# Patient Record
Sex: Female | Born: 1950 | Race: Black or African American | Hispanic: No | Marital: Single
Health system: Southern US, Community
[De-identification: ages and names within clinical notes are randomized; demographics above are authoritative.]

## PROBLEM LIST (undated history)

## (undated) DIAGNOSIS — R569 Unspecified convulsions: Secondary | ICD-10-CM

## (undated) DIAGNOSIS — I639 Cerebral infarction, unspecified: Secondary | ICD-10-CM

## (undated) DIAGNOSIS — I10 Essential (primary) hypertension: Secondary | ICD-10-CM

---

## 2014-02-21 DIAGNOSIS — J441 Chronic obstructive pulmonary disease with (acute) exacerbation: Secondary | ICD-10-CM | POA: Diagnosis not present

## 2014-02-21 DIAGNOSIS — Z9181 History of falling: Secondary | ICD-10-CM | POA: Diagnosis not present

## 2014-02-21 DIAGNOSIS — Z1331 Encounter for screening for depression: Secondary | ICD-10-CM | POA: Diagnosis not present

## 2014-02-21 DIAGNOSIS — I10 Essential (primary) hypertension: Secondary | ICD-10-CM | POA: Diagnosis not present

## 2014-02-21 DIAGNOSIS — M549 Dorsalgia, unspecified: Secondary | ICD-10-CM | POA: Diagnosis not present

## 2014-02-21 DIAGNOSIS — Z6841 Body Mass Index (BMI) 40.0 and over, adult: Secondary | ICD-10-CM | POA: Diagnosis not present

## 2014-03-23 DIAGNOSIS — I10 Essential (primary) hypertension: Secondary | ICD-10-CM | POA: Diagnosis not present

## 2014-03-23 DIAGNOSIS — R1032 Left lower quadrant pain: Secondary | ICD-10-CM | POA: Diagnosis not present

## 2014-03-23 DIAGNOSIS — Z23 Encounter for immunization: Secondary | ICD-10-CM | POA: Diagnosis not present

## 2014-03-23 DIAGNOSIS — Z6841 Body Mass Index (BMI) 40.0 and over, adult: Secondary | ICD-10-CM | POA: Diagnosis not present

## 2014-03-23 DIAGNOSIS — M549 Dorsalgia, unspecified: Secondary | ICD-10-CM | POA: Diagnosis not present

## 2014-04-05 DIAGNOSIS — Z6841 Body Mass Index (BMI) 40.0 and over, adult: Secondary | ICD-10-CM | POA: Diagnosis not present

## 2014-04-05 DIAGNOSIS — R1032 Left lower quadrant pain: Secondary | ICD-10-CM | POA: Diagnosis not present

## 2014-04-05 DIAGNOSIS — M549 Dorsalgia, unspecified: Secondary | ICD-10-CM | POA: Diagnosis not present

## 2014-04-05 DIAGNOSIS — J449 Chronic obstructive pulmonary disease, unspecified: Secondary | ICD-10-CM | POA: Diagnosis not present

## 2014-04-05 DIAGNOSIS — K5900 Constipation, unspecified: Secondary | ICD-10-CM | POA: Diagnosis not present

## 2014-04-05 DIAGNOSIS — N289 Disorder of kidney and ureter, unspecified: Secondary | ICD-10-CM | POA: Diagnosis not present

## 2014-04-05 DIAGNOSIS — E785 Hyperlipidemia, unspecified: Secondary | ICD-10-CM | POA: Diagnosis not present

## 2014-04-05 DIAGNOSIS — E1129 Type 2 diabetes mellitus with other diabetic kidney complication: Secondary | ICD-10-CM | POA: Diagnosis not present

## 2014-05-07 DIAGNOSIS — R1032 Left lower quadrant pain: Secondary | ICD-10-CM | POA: Diagnosis not present

## 2014-05-07 DIAGNOSIS — M549 Dorsalgia, unspecified: Secondary | ICD-10-CM | POA: Diagnosis not present

## 2014-05-07 DIAGNOSIS — Z6841 Body Mass Index (BMI) 40.0 and over, adult: Secondary | ICD-10-CM | POA: Diagnosis not present

## 2014-05-07 DIAGNOSIS — N289 Disorder of kidney and ureter, unspecified: Secondary | ICD-10-CM | POA: Diagnosis not present

## 2014-05-07 DIAGNOSIS — K5900 Constipation, unspecified: Secondary | ICD-10-CM | POA: Diagnosis not present

## 2014-05-16 DIAGNOSIS — R1032 Left lower quadrant pain: Secondary | ICD-10-CM | POA: Diagnosis not present

## 2014-06-06 DIAGNOSIS — Z6841 Body Mass Index (BMI) 40.0 and over, adult: Secondary | ICD-10-CM | POA: Diagnosis not present

## 2014-06-06 DIAGNOSIS — K5900 Constipation, unspecified: Secondary | ICD-10-CM | POA: Diagnosis not present

## 2014-06-06 DIAGNOSIS — M549 Dorsalgia, unspecified: Secondary | ICD-10-CM | POA: Diagnosis not present

## 2014-07-11 DIAGNOSIS — J449 Chronic obstructive pulmonary disease, unspecified: Secondary | ICD-10-CM | POA: Diagnosis not present

## 2014-07-11 DIAGNOSIS — M549 Dorsalgia, unspecified: Secondary | ICD-10-CM | POA: Diagnosis not present

## 2014-07-11 DIAGNOSIS — K5900 Constipation, unspecified: Secondary | ICD-10-CM | POA: Diagnosis not present

## 2014-07-11 DIAGNOSIS — E785 Hyperlipidemia, unspecified: Secondary | ICD-10-CM | POA: Diagnosis not present

## 2014-07-11 DIAGNOSIS — E1121 Type 2 diabetes mellitus with diabetic nephropathy: Secondary | ICD-10-CM | POA: Diagnosis not present

## 2014-07-11 DIAGNOSIS — N289 Disorder of kidney and ureter, unspecified: Secondary | ICD-10-CM | POA: Diagnosis not present

## 2014-07-11 DIAGNOSIS — Z6841 Body Mass Index (BMI) 40.0 and over, adult: Secondary | ICD-10-CM | POA: Diagnosis not present

## 2014-08-10 DIAGNOSIS — K5900 Constipation, unspecified: Secondary | ICD-10-CM | POA: Diagnosis not present

## 2014-08-10 DIAGNOSIS — I10 Essential (primary) hypertension: Secondary | ICD-10-CM | POA: Diagnosis not present

## 2014-08-10 DIAGNOSIS — J449 Chronic obstructive pulmonary disease, unspecified: Secondary | ICD-10-CM | POA: Diagnosis not present

## 2014-08-10 DIAGNOSIS — Z6841 Body Mass Index (BMI) 40.0 and over, adult: Secondary | ICD-10-CM | POA: Diagnosis not present

## 2014-08-10 DIAGNOSIS — M549 Dorsalgia, unspecified: Secondary | ICD-10-CM | POA: Diagnosis not present

## 2014-09-12 DIAGNOSIS — G2581 Restless legs syndrome: Secondary | ICD-10-CM | POA: Diagnosis not present

## 2014-09-12 DIAGNOSIS — E1121 Type 2 diabetes mellitus with diabetic nephropathy: Secondary | ICD-10-CM | POA: Diagnosis not present

## 2014-09-12 DIAGNOSIS — Z6841 Body Mass Index (BMI) 40.0 and over, adult: Secondary | ICD-10-CM | POA: Diagnosis not present

## 2014-09-12 DIAGNOSIS — I10 Essential (primary) hypertension: Secondary | ICD-10-CM | POA: Diagnosis not present

## 2014-09-12 DIAGNOSIS — M549 Dorsalgia, unspecified: Secondary | ICD-10-CM | POA: Diagnosis not present

## 2014-10-12 DIAGNOSIS — E1149 Type 2 diabetes mellitus with other diabetic neurological complication: Secondary | ICD-10-CM | POA: Diagnosis not present

## 2014-10-12 DIAGNOSIS — Z79899 Other long term (current) drug therapy: Secondary | ICD-10-CM | POA: Diagnosis not present

## 2014-10-12 DIAGNOSIS — I10 Essential (primary) hypertension: Secondary | ICD-10-CM | POA: Diagnosis not present

## 2014-10-12 DIAGNOSIS — G2581 Restless legs syndrome: Secondary | ICD-10-CM | POA: Diagnosis not present

## 2014-10-12 DIAGNOSIS — M545 Low back pain: Secondary | ICD-10-CM | POA: Diagnosis not present

## 2014-10-12 DIAGNOSIS — E785 Hyperlipidemia, unspecified: Secondary | ICD-10-CM | POA: Diagnosis not present

## 2014-10-12 DIAGNOSIS — J309 Allergic rhinitis, unspecified: Secondary | ICD-10-CM | POA: Diagnosis not present

## 2014-10-12 DIAGNOSIS — D539 Nutritional anemia, unspecified: Secondary | ICD-10-CM | POA: Diagnosis not present

## 2014-10-12 DIAGNOSIS — Z6841 Body Mass Index (BMI) 40.0 and over, adult: Secondary | ICD-10-CM | POA: Diagnosis not present

## 2014-11-09 DIAGNOSIS — M545 Low back pain: Secondary | ICD-10-CM | POA: Diagnosis not present

## 2014-11-09 DIAGNOSIS — J449 Chronic obstructive pulmonary disease, unspecified: Secondary | ICD-10-CM | POA: Diagnosis not present

## 2014-12-10 DIAGNOSIS — Z6839 Body mass index (BMI) 39.0-39.9, adult: Secondary | ICD-10-CM | POA: Diagnosis not present

## 2014-12-10 DIAGNOSIS — M545 Low back pain: Secondary | ICD-10-CM | POA: Diagnosis not present

## 2014-12-10 DIAGNOSIS — J441 Chronic obstructive pulmonary disease with (acute) exacerbation: Secondary | ICD-10-CM | POA: Diagnosis not present

## 2015-01-08 DIAGNOSIS — J449 Chronic obstructive pulmonary disease, unspecified: Secondary | ICD-10-CM | POA: Diagnosis not present

## 2015-01-08 DIAGNOSIS — Z9181 History of falling: Secondary | ICD-10-CM | POA: Diagnosis not present

## 2015-01-08 DIAGNOSIS — D539 Nutritional anemia, unspecified: Secondary | ICD-10-CM | POA: Diagnosis not present

## 2015-01-08 DIAGNOSIS — E1121 Type 2 diabetes mellitus with diabetic nephropathy: Secondary | ICD-10-CM | POA: Diagnosis not present

## 2015-01-08 DIAGNOSIS — I10 Essential (primary) hypertension: Secondary | ICD-10-CM | POA: Diagnosis not present

## 2015-01-08 DIAGNOSIS — Z6841 Body Mass Index (BMI) 40.0 and over, adult: Secondary | ICD-10-CM | POA: Diagnosis not present

## 2015-02-13 DIAGNOSIS — Z0000 Encounter for general adult medical examination without abnormal findings: Secondary | ICD-10-CM | POA: Diagnosis not present

## 2015-02-13 DIAGNOSIS — Z1231 Encounter for screening mammogram for malignant neoplasm of breast: Secondary | ICD-10-CM | POA: Diagnosis not present

## 2015-02-13 DIAGNOSIS — M8588 Other specified disorders of bone density and structure, other site: Secondary | ICD-10-CM | POA: Diagnosis not present

## 2015-02-13 DIAGNOSIS — Z6841 Body Mass Index (BMI) 40.0 and over, adult: Secondary | ICD-10-CM | POA: Diagnosis not present

## 2015-02-27 DIAGNOSIS — Z1231 Encounter for screening mammogram for malignant neoplasm of breast: Secondary | ICD-10-CM | POA: Diagnosis not present

## 2015-02-27 DIAGNOSIS — M8589 Other specified disorders of bone density and structure, multiple sites: Secondary | ICD-10-CM | POA: Diagnosis not present

## 2015-03-11 DIAGNOSIS — K5909 Other constipation: Secondary | ICD-10-CM | POA: Diagnosis not present

## 2015-03-11 DIAGNOSIS — M545 Low back pain: Secondary | ICD-10-CM | POA: Diagnosis not present

## 2015-03-11 DIAGNOSIS — Z6841 Body Mass Index (BMI) 40.0 and over, adult: Secondary | ICD-10-CM | POA: Diagnosis not present

## 2015-04-10 DIAGNOSIS — Z6841 Body Mass Index (BMI) 40.0 and over, adult: Secondary | ICD-10-CM | POA: Diagnosis not present

## 2015-04-10 DIAGNOSIS — M545 Low back pain: Secondary | ICD-10-CM | POA: Diagnosis not present

## 2015-05-08 DIAGNOSIS — Z6841 Body Mass Index (BMI) 40.0 and over, adult: Secondary | ICD-10-CM | POA: Diagnosis not present

## 2015-05-08 DIAGNOSIS — M545 Low back pain: Secondary | ICD-10-CM | POA: Diagnosis not present

## 2015-06-12 DIAGNOSIS — E785 Hyperlipidemia, unspecified: Secondary | ICD-10-CM | POA: Diagnosis not present

## 2015-06-12 DIAGNOSIS — E1121 Type 2 diabetes mellitus with diabetic nephropathy: Secondary | ICD-10-CM | POA: Diagnosis not present

## 2015-06-12 DIAGNOSIS — M545 Low back pain: Secondary | ICD-10-CM | POA: Diagnosis not present

## 2015-06-12 DIAGNOSIS — K5909 Other constipation: Secondary | ICD-10-CM | POA: Diagnosis not present

## 2015-06-12 DIAGNOSIS — D539 Nutritional anemia, unspecified: Secondary | ICD-10-CM | POA: Diagnosis not present

## 2015-06-12 DIAGNOSIS — I10 Essential (primary) hypertension: Secondary | ICD-10-CM | POA: Diagnosis not present

## 2015-06-12 DIAGNOSIS — Z6841 Body Mass Index (BMI) 40.0 and over, adult: Secondary | ICD-10-CM | POA: Diagnosis not present

## 2015-07-15 DIAGNOSIS — Z6841 Body Mass Index (BMI) 40.0 and over, adult: Secondary | ICD-10-CM | POA: Diagnosis not present

## 2015-07-15 DIAGNOSIS — M545 Low back pain: Secondary | ICD-10-CM | POA: Diagnosis not present

## 2015-07-15 DIAGNOSIS — J441 Chronic obstructive pulmonary disease with (acute) exacerbation: Secondary | ICD-10-CM | POA: Diagnosis not present

## 2015-07-15 DIAGNOSIS — Z79891 Long term (current) use of opiate analgesic: Secondary | ICD-10-CM | POA: Diagnosis not present

## 2015-08-15 DIAGNOSIS — E669 Obesity, unspecified: Secondary | ICD-10-CM | POA: Diagnosis not present

## 2015-08-15 DIAGNOSIS — Z6839 Body mass index (BMI) 39.0-39.9, adult: Secondary | ICD-10-CM | POA: Diagnosis not present

## 2015-08-15 DIAGNOSIS — M545 Low back pain: Secondary | ICD-10-CM | POA: Diagnosis not present

## 2015-08-15 DIAGNOSIS — Z1389 Encounter for screening for other disorder: Secondary | ICD-10-CM | POA: Diagnosis not present

## 2015-09-17 DIAGNOSIS — F17200 Nicotine dependence, unspecified, uncomplicated: Secondary | ICD-10-CM | POA: Diagnosis not present

## 2015-09-17 DIAGNOSIS — J449 Chronic obstructive pulmonary disease, unspecified: Secondary | ICD-10-CM | POA: Diagnosis not present

## 2015-09-17 DIAGNOSIS — M79621 Pain in right upper arm: Secondary | ICD-10-CM | POA: Diagnosis not present

## 2015-09-17 DIAGNOSIS — M545 Low back pain: Secondary | ICD-10-CM | POA: Diagnosis not present

## 2015-09-17 DIAGNOSIS — J441 Chronic obstructive pulmonary disease with (acute) exacerbation: Secondary | ICD-10-CM | POA: Diagnosis not present

## 2015-09-17 DIAGNOSIS — Z6838 Body mass index (BMI) 38.0-38.9, adult: Secondary | ICD-10-CM | POA: Diagnosis not present

## 2015-10-07 DIAGNOSIS — R2231 Localized swelling, mass and lump, right upper limb: Secondary | ICD-10-CM | POA: Diagnosis not present

## 2015-10-07 DIAGNOSIS — M79621 Pain in right upper arm: Secondary | ICD-10-CM | POA: Diagnosis not present

## 2015-10-15 DIAGNOSIS — J449 Chronic obstructive pulmonary disease, unspecified: Secondary | ICD-10-CM | POA: Diagnosis not present

## 2015-10-15 DIAGNOSIS — Z6839 Body mass index (BMI) 39.0-39.9, adult: Secondary | ICD-10-CM | POA: Diagnosis not present

## 2015-10-15 DIAGNOSIS — E669 Obesity, unspecified: Secondary | ICD-10-CM | POA: Diagnosis not present

## 2015-10-15 DIAGNOSIS — M545 Low back pain: Secondary | ICD-10-CM | POA: Diagnosis not present

## 2015-10-15 DIAGNOSIS — M79621 Pain in right upper arm: Secondary | ICD-10-CM | POA: Diagnosis not present

## 2015-10-15 DIAGNOSIS — E1121 Type 2 diabetes mellitus with diabetic nephropathy: Secondary | ICD-10-CM | POA: Diagnosis not present

## 2015-10-15 DIAGNOSIS — D539 Nutritional anemia, unspecified: Secondary | ICD-10-CM | POA: Diagnosis not present

## 2015-10-15 DIAGNOSIS — E785 Hyperlipidemia, unspecified: Secondary | ICD-10-CM | POA: Diagnosis not present

## 2015-11-07 DIAGNOSIS — M79621 Pain in right upper arm: Secondary | ICD-10-CM | POA: Diagnosis not present

## 2015-11-12 DIAGNOSIS — M79621 Pain in right upper arm: Secondary | ICD-10-CM | POA: Diagnosis not present

## 2015-11-15 DIAGNOSIS — M545 Low back pain: Secondary | ICD-10-CM | POA: Diagnosis not present

## 2015-11-15 DIAGNOSIS — R7989 Other specified abnormal findings of blood chemistry: Secondary | ICD-10-CM | POA: Diagnosis not present

## 2015-11-15 DIAGNOSIS — Z6838 Body mass index (BMI) 38.0-38.9, adult: Secondary | ICD-10-CM | POA: Diagnosis not present

## 2015-11-18 DIAGNOSIS — M79621 Pain in right upper arm: Secondary | ICD-10-CM | POA: Diagnosis not present

## 2015-11-26 DIAGNOSIS — M79621 Pain in right upper arm: Secondary | ICD-10-CM | POA: Diagnosis not present

## 2015-11-28 DIAGNOSIS — M79621 Pain in right upper arm: Secondary | ICD-10-CM | POA: Diagnosis not present

## 2015-12-13 DIAGNOSIS — E1149 Type 2 diabetes mellitus with other diabetic neurological complication: Secondary | ICD-10-CM | POA: Diagnosis not present

## 2015-12-13 DIAGNOSIS — Z6837 Body mass index (BMI) 37.0-37.9, adult: Secondary | ICD-10-CM | POA: Diagnosis not present

## 2015-12-13 DIAGNOSIS — M545 Low back pain: Secondary | ICD-10-CM | POA: Diagnosis not present

## 2015-12-21 DIAGNOSIS — R0602 Shortness of breath: Secondary | ICD-10-CM | POA: Diagnosis not present

## 2015-12-21 DIAGNOSIS — R531 Weakness: Secondary | ICD-10-CM | POA: Diagnosis not present

## 2015-12-21 DIAGNOSIS — R55 Syncope and collapse: Secondary | ICD-10-CM | POA: Diagnosis not present

## 2015-12-22 DIAGNOSIS — E119 Type 2 diabetes mellitus without complications: Secondary | ICD-10-CM | POA: Diagnosis not present

## 2015-12-22 DIAGNOSIS — E1140 Type 2 diabetes mellitus with diabetic neuropathy, unspecified: Secondary | ICD-10-CM | POA: Diagnosis not present

## 2015-12-22 DIAGNOSIS — N289 Disorder of kidney and ureter, unspecified: Secondary | ICD-10-CM | POA: Diagnosis not present

## 2015-12-22 DIAGNOSIS — E162 Hypoglycemia, unspecified: Secondary | ICD-10-CM | POA: Diagnosis not present

## 2016-01-10 DIAGNOSIS — Z9181 History of falling: Secondary | ICD-10-CM | POA: Diagnosis not present

## 2016-01-10 DIAGNOSIS — M545 Low back pain: Secondary | ICD-10-CM | POA: Diagnosis not present

## 2016-01-15 DIAGNOSIS — E113293 Type 2 diabetes mellitus with mild nonproliferative diabetic retinopathy without macular edema, bilateral: Secondary | ICD-10-CM | POA: Diagnosis not present

## 2016-01-15 DIAGNOSIS — H25813 Combined forms of age-related cataract, bilateral: Secondary | ICD-10-CM | POA: Diagnosis not present

## 2016-01-16 DIAGNOSIS — E161 Other hypoglycemia: Secondary | ICD-10-CM | POA: Diagnosis not present

## 2016-01-16 DIAGNOSIS — E1121 Type 2 diabetes mellitus with diabetic nephropathy: Secondary | ICD-10-CM | POA: Diagnosis not present

## 2016-01-16 DIAGNOSIS — Z79899 Other long term (current) drug therapy: Secondary | ICD-10-CM | POA: Diagnosis not present

## 2016-02-07 DIAGNOSIS — M545 Low back pain: Secondary | ICD-10-CM | POA: Diagnosis not present

## 2016-02-07 DIAGNOSIS — K219 Gastro-esophageal reflux disease without esophagitis: Secondary | ICD-10-CM | POA: Diagnosis not present

## 2016-02-07 DIAGNOSIS — I10 Essential (primary) hypertension: Secondary | ICD-10-CM | POA: Diagnosis not present

## 2016-02-07 DIAGNOSIS — E1121 Type 2 diabetes mellitus with diabetic nephropathy: Secondary | ICD-10-CM | POA: Diagnosis not present

## 2016-02-07 DIAGNOSIS — Z6837 Body mass index (BMI) 37.0-37.9, adult: Secondary | ICD-10-CM | POA: Diagnosis not present

## 2016-03-09 DIAGNOSIS — M545 Low back pain: Secondary | ICD-10-CM | POA: Diagnosis not present

## 2016-04-08 DIAGNOSIS — I10 Essential (primary) hypertension: Secondary | ICD-10-CM | POA: Diagnosis not present

## 2016-04-08 DIAGNOSIS — M545 Low back pain: Secondary | ICD-10-CM | POA: Diagnosis not present

## 2016-05-05 DIAGNOSIS — I10 Essential (primary) hypertension: Secondary | ICD-10-CM | POA: Diagnosis not present

## 2016-05-05 DIAGNOSIS — E1121 Type 2 diabetes mellitus with diabetic nephropathy: Secondary | ICD-10-CM | POA: Diagnosis not present

## 2016-05-05 DIAGNOSIS — F17200 Nicotine dependence, unspecified, uncomplicated: Secondary | ICD-10-CM | POA: Diagnosis not present

## 2016-05-05 DIAGNOSIS — M545 Low back pain: Secondary | ICD-10-CM | POA: Diagnosis not present

## 2016-05-05 DIAGNOSIS — N289 Disorder of kidney and ureter, unspecified: Secondary | ICD-10-CM | POA: Diagnosis not present

## 2016-05-05 DIAGNOSIS — E785 Hyperlipidemia, unspecified: Secondary | ICD-10-CM | POA: Diagnosis not present

## 2016-05-05 DIAGNOSIS — D539 Nutritional anemia, unspecified: Secondary | ICD-10-CM | POA: Diagnosis not present

## 2016-06-02 DIAGNOSIS — E669 Obesity, unspecified: Secondary | ICD-10-CM | POA: Diagnosis not present

## 2016-06-02 DIAGNOSIS — M545 Low back pain: Secondary | ICD-10-CM | POA: Diagnosis not present

## 2016-06-02 DIAGNOSIS — D539 Nutritional anemia, unspecified: Secondary | ICD-10-CM | POA: Diagnosis not present

## 2016-06-02 DIAGNOSIS — N289 Disorder of kidney and ureter, unspecified: Secondary | ICD-10-CM | POA: Diagnosis not present

## 2016-06-02 DIAGNOSIS — I10 Essential (primary) hypertension: Secondary | ICD-10-CM | POA: Diagnosis not present

## 2016-07-03 DIAGNOSIS — Z79891 Long term (current) use of opiate analgesic: Secondary | ICD-10-CM | POA: Diagnosis not present

## 2016-07-03 DIAGNOSIS — M545 Low back pain: Secondary | ICD-10-CM | POA: Diagnosis not present

## 2016-07-03 DIAGNOSIS — N289 Disorder of kidney and ureter, unspecified: Secondary | ICD-10-CM | POA: Diagnosis not present

## 2016-07-03 DIAGNOSIS — Z1231 Encounter for screening mammogram for malignant neoplasm of breast: Secondary | ICD-10-CM | POA: Diagnosis not present

## 2016-07-20 DIAGNOSIS — R928 Other abnormal and inconclusive findings on diagnostic imaging of breast: Secondary | ICD-10-CM | POA: Diagnosis not present

## 2016-07-23 DIAGNOSIS — E113293 Type 2 diabetes mellitus with mild nonproliferative diabetic retinopathy without macular edema, bilateral: Secondary | ICD-10-CM | POA: Diagnosis not present

## 2016-07-23 DIAGNOSIS — H25813 Combined forms of age-related cataract, bilateral: Secondary | ICD-10-CM | POA: Diagnosis not present

## 2016-08-03 DIAGNOSIS — M545 Low back pain: Secondary | ICD-10-CM | POA: Diagnosis not present

## 2016-08-03 DIAGNOSIS — Z6837 Body mass index (BMI) 37.0-37.9, adult: Secondary | ICD-10-CM | POA: Diagnosis not present

## 2016-08-31 DIAGNOSIS — M545 Low back pain: Secondary | ICD-10-CM | POA: Diagnosis not present

## 2016-08-31 DIAGNOSIS — Z1389 Encounter for screening for other disorder: Secondary | ICD-10-CM | POA: Diagnosis not present

## 2016-08-31 DIAGNOSIS — Z6841 Body Mass Index (BMI) 40.0 and over, adult: Secondary | ICD-10-CM | POA: Diagnosis not present

## 2016-08-31 DIAGNOSIS — L659 Nonscarring hair loss, unspecified: Secondary | ICD-10-CM | POA: Diagnosis not present

## 2016-08-31 DIAGNOSIS — J449 Chronic obstructive pulmonary disease, unspecified: Secondary | ICD-10-CM | POA: Diagnosis not present

## 2016-08-31 DIAGNOSIS — E785 Hyperlipidemia, unspecified: Secondary | ICD-10-CM | POA: Diagnosis not present

## 2016-08-31 DIAGNOSIS — E1121 Type 2 diabetes mellitus with diabetic nephropathy: Secondary | ICD-10-CM | POA: Diagnosis not present

## 2016-08-31 DIAGNOSIS — K5909 Other constipation: Secondary | ICD-10-CM | POA: Diagnosis not present

## 2016-09-17 DIAGNOSIS — D649 Anemia, unspecified: Secondary | ICD-10-CM | POA: Diagnosis not present

## 2016-09-17 DIAGNOSIS — K5903 Drug induced constipation: Secondary | ICD-10-CM | POA: Diagnosis not present

## 2016-09-29 DIAGNOSIS — I517 Cardiomegaly: Secondary | ICD-10-CM | POA: Diagnosis not present

## 2016-09-29 DIAGNOSIS — E119 Type 2 diabetes mellitus without complications: Secondary | ICD-10-CM | POA: Diagnosis not present

## 2016-09-29 DIAGNOSIS — R51 Headache: Secondary | ICD-10-CM | POA: Diagnosis not present

## 2016-09-29 DIAGNOSIS — I712 Thoracic aortic aneurysm, without rupture: Secondary | ICD-10-CM | POA: Diagnosis not present

## 2016-09-29 DIAGNOSIS — G4489 Other headache syndrome: Secondary | ICD-10-CM | POA: Diagnosis not present

## 2016-09-29 DIAGNOSIS — R52 Pain, unspecified: Secondary | ICD-10-CM | POA: Diagnosis not present

## 2016-09-29 DIAGNOSIS — I160 Hypertensive urgency: Secondary | ICD-10-CM | POA: Diagnosis not present

## 2016-09-30 DIAGNOSIS — I10 Essential (primary) hypertension: Secondary | ICD-10-CM | POA: Diagnosis not present

## 2016-09-30 DIAGNOSIS — M545 Low back pain: Secondary | ICD-10-CM | POA: Diagnosis not present

## 2016-10-04 DIAGNOSIS — I10 Essential (primary) hypertension: Secondary | ICD-10-CM | POA: Diagnosis not present

## 2016-10-04 DIAGNOSIS — R0981 Nasal congestion: Secondary | ICD-10-CM | POA: Diagnosis not present

## 2016-10-04 DIAGNOSIS — G894 Chronic pain syndrome: Secondary | ICD-10-CM | POA: Diagnosis not present

## 2016-10-04 DIAGNOSIS — R51 Headache: Secondary | ICD-10-CM | POA: Diagnosis not present

## 2016-10-04 DIAGNOSIS — E119 Type 2 diabetes mellitus without complications: Secondary | ICD-10-CM | POA: Diagnosis not present

## 2016-10-04 DIAGNOSIS — M542 Cervicalgia: Secondary | ICD-10-CM | POA: Diagnosis not present

## 2016-10-06 DIAGNOSIS — I10 Essential (primary) hypertension: Secondary | ICD-10-CM | POA: Diagnosis not present

## 2016-10-06 DIAGNOSIS — R51 Headache: Secondary | ICD-10-CM | POA: Diagnosis not present

## 2016-10-20 DIAGNOSIS — D649 Anemia, unspecified: Secondary | ICD-10-CM | POA: Diagnosis not present

## 2016-10-20 DIAGNOSIS — K5903 Drug induced constipation: Secondary | ICD-10-CM | POA: Diagnosis not present

## 2016-10-26 DIAGNOSIS — D649 Anemia, unspecified: Secondary | ICD-10-CM | POA: Diagnosis not present

## 2016-10-26 DIAGNOSIS — D500 Iron deficiency anemia secondary to blood loss (chronic): Secondary | ICD-10-CM | POA: Diagnosis not present

## 2016-10-26 DIAGNOSIS — D6489 Other specified anemias: Secondary | ICD-10-CM | POA: Diagnosis not present

## 2016-10-30 DIAGNOSIS — I10 Essential (primary) hypertension: Secondary | ICD-10-CM | POA: Diagnosis not present

## 2016-10-30 DIAGNOSIS — M545 Low back pain: Secondary | ICD-10-CM | POA: Diagnosis not present

## 2016-11-13 DIAGNOSIS — K2901 Acute gastritis with bleeding: Secondary | ICD-10-CM | POA: Diagnosis not present

## 2016-11-13 DIAGNOSIS — D126 Benign neoplasm of colon, unspecified: Secondary | ICD-10-CM | POA: Diagnosis not present

## 2016-11-13 DIAGNOSIS — K2950 Unspecified chronic gastritis without bleeding: Secondary | ICD-10-CM | POA: Diagnosis not present

## 2016-11-13 DIAGNOSIS — D611 Drug-induced aplastic anemia: Secondary | ICD-10-CM | POA: Diagnosis not present

## 2016-11-13 DIAGNOSIS — K635 Polyp of colon: Secondary | ICD-10-CM | POA: Diagnosis not present

## 2016-11-13 DIAGNOSIS — K621 Rectal polyp: Secondary | ICD-10-CM | POA: Diagnosis not present

## 2016-11-13 DIAGNOSIS — D500 Iron deficiency anemia secondary to blood loss (chronic): Secondary | ICD-10-CM | POA: Diagnosis not present

## 2016-11-30 DIAGNOSIS — Z6839 Body mass index (BMI) 39.0-39.9, adult: Secondary | ICD-10-CM | POA: Diagnosis not present

## 2016-11-30 DIAGNOSIS — R600 Localized edema: Secondary | ICD-10-CM | POA: Diagnosis not present

## 2016-11-30 DIAGNOSIS — I10 Essential (primary) hypertension: Secondary | ICD-10-CM | POA: Diagnosis not present

## 2016-11-30 DIAGNOSIS — E1121 Type 2 diabetes mellitus with diabetic nephropathy: Secondary | ICD-10-CM | POA: Diagnosis not present

## 2016-11-30 DIAGNOSIS — M545 Low back pain: Secondary | ICD-10-CM | POA: Diagnosis not present

## 2016-12-10 DIAGNOSIS — D649 Anemia, unspecified: Secondary | ICD-10-CM | POA: Diagnosis not present

## 2016-12-15 DIAGNOSIS — K2901 Acute gastritis with bleeding: Secondary | ICD-10-CM | POA: Diagnosis not present

## 2016-12-15 DIAGNOSIS — K5903 Drug induced constipation: Secondary | ICD-10-CM | POA: Diagnosis not present

## 2016-12-15 DIAGNOSIS — D500 Iron deficiency anemia secondary to blood loss (chronic): Secondary | ICD-10-CM | POA: Diagnosis not present

## 2016-12-15 DIAGNOSIS — Z1212 Encounter for screening for malignant neoplasm of rectum: Secondary | ICD-10-CM | POA: Diagnosis not present

## 2016-12-31 DIAGNOSIS — E785 Hyperlipidemia, unspecified: Secondary | ICD-10-CM | POA: Diagnosis not present

## 2016-12-31 DIAGNOSIS — M545 Low back pain: Secondary | ICD-10-CM | POA: Diagnosis not present

## 2016-12-31 DIAGNOSIS — E1121 Type 2 diabetes mellitus with diabetic nephropathy: Secondary | ICD-10-CM | POA: Diagnosis not present

## 2016-12-31 DIAGNOSIS — J449 Chronic obstructive pulmonary disease, unspecified: Secondary | ICD-10-CM | POA: Diagnosis not present

## 2016-12-31 DIAGNOSIS — R600 Localized edema: Secondary | ICD-10-CM | POA: Diagnosis not present

## 2016-12-31 DIAGNOSIS — D539 Nutritional anemia, unspecified: Secondary | ICD-10-CM | POA: Diagnosis not present

## 2016-12-31 DIAGNOSIS — H5712 Ocular pain, left eye: Secondary | ICD-10-CM | POA: Diagnosis not present

## 2017-01-28 DIAGNOSIS — R51 Headache: Secondary | ICD-10-CM | POA: Diagnosis not present

## 2017-01-28 DIAGNOSIS — M545 Low back pain: Secondary | ICD-10-CM | POA: Diagnosis not present

## 2017-01-28 DIAGNOSIS — Z6835 Body mass index (BMI) 35.0-35.9, adult: Secondary | ICD-10-CM | POA: Diagnosis not present

## 2017-01-28 DIAGNOSIS — Z9181 History of falling: Secondary | ICD-10-CM | POA: Diagnosis not present

## 2017-02-25 DIAGNOSIS — M545 Low back pain: Secondary | ICD-10-CM | POA: Diagnosis not present

## 2017-02-25 DIAGNOSIS — Z9181 History of falling: Secondary | ICD-10-CM | POA: Diagnosis not present

## 2017-02-25 DIAGNOSIS — Z79891 Long term (current) use of opiate analgesic: Secondary | ICD-10-CM | POA: Diagnosis not present

## 2017-02-25 DIAGNOSIS — Z1389 Encounter for screening for other disorder: Secondary | ICD-10-CM | POA: Diagnosis not present

## 2017-02-25 DIAGNOSIS — R51 Headache: Secondary | ICD-10-CM | POA: Diagnosis not present

## 2017-03-30 DIAGNOSIS — Z2821 Immunization not carried out because of patient refusal: Secondary | ICD-10-CM | POA: Diagnosis not present

## 2017-03-30 DIAGNOSIS — J449 Chronic obstructive pulmonary disease, unspecified: Secondary | ICD-10-CM | POA: Diagnosis not present

## 2017-03-30 DIAGNOSIS — M545 Low back pain: Secondary | ICD-10-CM | POA: Diagnosis not present

## 2017-04-15 DIAGNOSIS — D500 Iron deficiency anemia secondary to blood loss (chronic): Secondary | ICD-10-CM | POA: Diagnosis not present

## 2017-04-22 DIAGNOSIS — K2901 Acute gastritis with bleeding: Secondary | ICD-10-CM | POA: Diagnosis not present

## 2017-04-22 DIAGNOSIS — Z1212 Encounter for screening for malignant neoplasm of rectum: Secondary | ICD-10-CM | POA: Diagnosis not present

## 2017-04-22 DIAGNOSIS — D500 Iron deficiency anemia secondary to blood loss (chronic): Secondary | ICD-10-CM | POA: Diagnosis not present

## 2017-04-22 DIAGNOSIS — K5903 Drug induced constipation: Secondary | ICD-10-CM | POA: Diagnosis not present

## 2017-04-30 DIAGNOSIS — M545 Low back pain: Secondary | ICD-10-CM | POA: Diagnosis not present

## 2017-04-30 DIAGNOSIS — E669 Obesity, unspecified: Secondary | ICD-10-CM | POA: Diagnosis not present

## 2017-04-30 DIAGNOSIS — E785 Hyperlipidemia, unspecified: Secondary | ICD-10-CM | POA: Diagnosis not present

## 2017-04-30 DIAGNOSIS — N289 Disorder of kidney and ureter, unspecified: Secondary | ICD-10-CM | POA: Diagnosis not present

## 2017-04-30 DIAGNOSIS — D539 Nutritional anemia, unspecified: Secondary | ICD-10-CM | POA: Diagnosis not present

## 2017-04-30 DIAGNOSIS — E1121 Type 2 diabetes mellitus with diabetic nephropathy: Secondary | ICD-10-CM | POA: Diagnosis not present

## 2017-06-01 DIAGNOSIS — R51 Headache: Secondary | ICD-10-CM | POA: Diagnosis not present

## 2017-06-01 DIAGNOSIS — M545 Low back pain: Secondary | ICD-10-CM | POA: Diagnosis not present

## 2017-06-01 DIAGNOSIS — Z79891 Long term (current) use of opiate analgesic: Secondary | ICD-10-CM | POA: Diagnosis not present

## 2017-07-02 DIAGNOSIS — M8589 Other specified disorders of bone density and structure, multiple sites: Secondary | ICD-10-CM | POA: Diagnosis not present

## 2017-07-02 DIAGNOSIS — Z1231 Encounter for screening mammogram for malignant neoplasm of breast: Secondary | ICD-10-CM | POA: Diagnosis not present

## 2017-07-02 DIAGNOSIS — M545 Low back pain: Secondary | ICD-10-CM | POA: Diagnosis not present

## 2017-07-02 DIAGNOSIS — R112 Nausea with vomiting, unspecified: Secondary | ICD-10-CM | POA: Diagnosis not present

## 2017-07-28 DIAGNOSIS — M8589 Other specified disorders of bone density and structure, multiple sites: Secondary | ICD-10-CM | POA: Diagnosis not present

## 2017-07-28 DIAGNOSIS — M85852 Other specified disorders of bone density and structure, left thigh: Secondary | ICD-10-CM | POA: Diagnosis not present

## 2017-07-28 DIAGNOSIS — Z1231 Encounter for screening mammogram for malignant neoplasm of breast: Secondary | ICD-10-CM | POA: Diagnosis not present

## 2017-08-02 DIAGNOSIS — J449 Chronic obstructive pulmonary disease, unspecified: Secondary | ICD-10-CM | POA: Diagnosis not present

## 2017-08-02 DIAGNOSIS — Z2821 Immunization not carried out because of patient refusal: Secondary | ICD-10-CM | POA: Diagnosis not present

## 2017-08-02 DIAGNOSIS — F17200 Nicotine dependence, unspecified, uncomplicated: Secondary | ICD-10-CM | POA: Diagnosis not present

## 2017-08-02 DIAGNOSIS — M8589 Other specified disorders of bone density and structure, multiple sites: Secondary | ICD-10-CM | POA: Diagnosis not present

## 2017-08-02 DIAGNOSIS — E785 Hyperlipidemia, unspecified: Secondary | ICD-10-CM | POA: Diagnosis not present

## 2017-08-02 DIAGNOSIS — E1121 Type 2 diabetes mellitus with diabetic nephropathy: Secondary | ICD-10-CM | POA: Diagnosis not present

## 2017-08-02 DIAGNOSIS — E559 Vitamin D deficiency, unspecified: Secondary | ICD-10-CM | POA: Diagnosis not present

## 2017-08-02 DIAGNOSIS — D539 Nutritional anemia, unspecified: Secondary | ICD-10-CM | POA: Diagnosis not present

## 2017-08-02 DIAGNOSIS — M545 Low back pain: Secondary | ICD-10-CM | POA: Diagnosis not present

## 2017-08-11 DIAGNOSIS — E113293 Type 2 diabetes mellitus with mild nonproliferative diabetic retinopathy without macular edema, bilateral: Secondary | ICD-10-CM | POA: Diagnosis not present

## 2017-08-11 DIAGNOSIS — H25813 Combined forms of age-related cataract, bilateral: Secondary | ICD-10-CM | POA: Diagnosis not present

## 2017-08-24 DIAGNOSIS — G4700 Insomnia, unspecified: Secondary | ICD-10-CM | POA: Diagnosis not present

## 2017-08-24 DIAGNOSIS — I10 Essential (primary) hypertension: Secondary | ICD-10-CM | POA: Diagnosis not present

## 2017-08-31 DIAGNOSIS — H25811 Combined forms of age-related cataract, right eye: Secondary | ICD-10-CM | POA: Diagnosis not present

## 2017-08-31 DIAGNOSIS — I10 Essential (primary) hypertension: Secondary | ICD-10-CM | POA: Diagnosis not present

## 2017-08-31 DIAGNOSIS — M545 Low back pain: Secondary | ICD-10-CM | POA: Diagnosis not present

## 2017-08-31 DIAGNOSIS — G4700 Insomnia, unspecified: Secondary | ICD-10-CM | POA: Diagnosis not present

## 2017-09-07 DIAGNOSIS — E7800 Pure hypercholesterolemia, unspecified: Secondary | ICD-10-CM | POA: Diagnosis not present

## 2017-09-07 DIAGNOSIS — Z7984 Long term (current) use of oral hypoglycemic drugs: Secondary | ICD-10-CM | POA: Diagnosis not present

## 2017-09-07 DIAGNOSIS — Z79899 Other long term (current) drug therapy: Secondary | ICD-10-CM | POA: Diagnosis not present

## 2017-09-07 DIAGNOSIS — J449 Chronic obstructive pulmonary disease, unspecified: Secondary | ICD-10-CM | POA: Diagnosis not present

## 2017-09-07 DIAGNOSIS — H259 Unspecified age-related cataract: Secondary | ICD-10-CM | POA: Diagnosis not present

## 2017-09-07 DIAGNOSIS — M8580 Other specified disorders of bone density and structure, unspecified site: Secondary | ICD-10-CM | POA: Diagnosis not present

## 2017-09-07 DIAGNOSIS — H25811 Combined forms of age-related cataract, right eye: Secondary | ICD-10-CM | POA: Diagnosis not present

## 2017-09-07 DIAGNOSIS — F17210 Nicotine dependence, cigarettes, uncomplicated: Secondary | ICD-10-CM | POA: Diagnosis not present

## 2017-09-07 DIAGNOSIS — K219 Gastro-esophageal reflux disease without esophagitis: Secondary | ICD-10-CM | POA: Diagnosis not present

## 2017-09-07 DIAGNOSIS — E113293 Type 2 diabetes mellitus with mild nonproliferative diabetic retinopathy without macular edema, bilateral: Secondary | ICD-10-CM | POA: Diagnosis not present

## 2017-09-07 DIAGNOSIS — I10 Essential (primary) hypertension: Secondary | ICD-10-CM | POA: Diagnosis not present

## 2017-09-21 DIAGNOSIS — E119 Type 2 diabetes mellitus without complications: Secondary | ICD-10-CM | POA: Diagnosis not present

## 2017-09-21 DIAGNOSIS — H259 Unspecified age-related cataract: Secondary | ICD-10-CM | POA: Diagnosis not present

## 2017-09-21 DIAGNOSIS — Z79899 Other long term (current) drug therapy: Secondary | ICD-10-CM | POA: Diagnosis not present

## 2017-09-21 DIAGNOSIS — I10 Essential (primary) hypertension: Secondary | ICD-10-CM | POA: Diagnosis not present

## 2017-09-21 DIAGNOSIS — H25812 Combined forms of age-related cataract, left eye: Secondary | ICD-10-CM | POA: Diagnosis not present

## 2017-09-21 DIAGNOSIS — M545 Low back pain: Secondary | ICD-10-CM | POA: Diagnosis not present

## 2017-09-21 DIAGNOSIS — J449 Chronic obstructive pulmonary disease, unspecified: Secondary | ICD-10-CM | POA: Diagnosis not present

## 2017-09-21 DIAGNOSIS — H40013 Open angle with borderline findings, low risk, bilateral: Secondary | ICD-10-CM | POA: Diagnosis not present

## 2017-09-21 DIAGNOSIS — K219 Gastro-esophageal reflux disease without esophagitis: Secondary | ICD-10-CM | POA: Diagnosis not present

## 2017-09-21 DIAGNOSIS — I2510 Atherosclerotic heart disease of native coronary artery without angina pectoris: Secondary | ICD-10-CM | POA: Diagnosis not present

## 2017-09-21 DIAGNOSIS — Z7984 Long term (current) use of oral hypoglycemic drugs: Secondary | ICD-10-CM | POA: Diagnosis not present

## 2017-09-21 DIAGNOSIS — D500 Iron deficiency anemia secondary to blood loss (chronic): Secondary | ICD-10-CM | POA: Diagnosis not present

## 2017-09-21 DIAGNOSIS — E1136 Type 2 diabetes mellitus with diabetic cataract: Secondary | ICD-10-CM | POA: Diagnosis not present

## 2017-09-21 DIAGNOSIS — G8929 Other chronic pain: Secondary | ICD-10-CM | POA: Diagnosis not present

## 2017-09-21 DIAGNOSIS — F17210 Nicotine dependence, cigarettes, uncomplicated: Secondary | ICD-10-CM | POA: Diagnosis not present

## 2017-09-21 DIAGNOSIS — E7800 Pure hypercholesterolemia, unspecified: Secondary | ICD-10-CM | POA: Diagnosis not present

## 2017-09-21 DIAGNOSIS — M810 Age-related osteoporosis without current pathological fracture: Secondary | ICD-10-CM | POA: Diagnosis not present

## 2017-09-23 DIAGNOSIS — K2901 Acute gastritis with bleeding: Secondary | ICD-10-CM | POA: Diagnosis not present

## 2017-09-23 DIAGNOSIS — D500 Iron deficiency anemia secondary to blood loss (chronic): Secondary | ICD-10-CM | POA: Diagnosis not present

## 2017-09-23 DIAGNOSIS — Z1212 Encounter for screening for malignant neoplasm of rectum: Secondary | ICD-10-CM | POA: Diagnosis not present

## 2017-09-23 DIAGNOSIS — K5903 Drug induced constipation: Secondary | ICD-10-CM | POA: Diagnosis not present

## 2017-09-30 DIAGNOSIS — G629 Polyneuropathy, unspecified: Secondary | ICD-10-CM | POA: Diagnosis not present

## 2017-09-30 DIAGNOSIS — M545 Low back pain: Secondary | ICD-10-CM | POA: Diagnosis not present

## 2017-09-30 DIAGNOSIS — G4700 Insomnia, unspecified: Secondary | ICD-10-CM | POA: Diagnosis not present

## 2017-09-30 DIAGNOSIS — I10 Essential (primary) hypertension: Secondary | ICD-10-CM | POA: Diagnosis not present

## 2017-09-30 DIAGNOSIS — E1140 Type 2 diabetes mellitus with diabetic neuropathy, unspecified: Secondary | ICD-10-CM | POA: Diagnosis not present

## 2017-10-29 DIAGNOSIS — R112 Nausea with vomiting, unspecified: Secondary | ICD-10-CM | POA: Diagnosis not present

## 2017-10-29 DIAGNOSIS — I10 Essential (primary) hypertension: Secondary | ICD-10-CM | POA: Diagnosis not present

## 2017-10-29 DIAGNOSIS — M545 Low back pain: Secondary | ICD-10-CM | POA: Diagnosis not present

## 2017-10-29 DIAGNOSIS — R634 Abnormal weight loss: Secondary | ICD-10-CM | POA: Diagnosis not present

## 2017-11-09 DIAGNOSIS — R112 Nausea with vomiting, unspecified: Secondary | ICD-10-CM | POA: Diagnosis not present

## 2017-11-09 DIAGNOSIS — R634 Abnormal weight loss: Secondary | ICD-10-CM | POA: Diagnosis not present

## 2017-11-09 DIAGNOSIS — R51 Headache: Secondary | ICD-10-CM | POA: Diagnosis not present

## 2017-11-11 DIAGNOSIS — R05 Cough: Secondary | ICD-10-CM | POA: Diagnosis not present

## 2017-11-11 DIAGNOSIS — R634 Abnormal weight loss: Secondary | ICD-10-CM | POA: Diagnosis not present

## 2017-11-23 DIAGNOSIS — M545 Low back pain: Secondary | ICD-10-CM | POA: Diagnosis not present

## 2017-11-23 DIAGNOSIS — R634 Abnormal weight loss: Secondary | ICD-10-CM | POA: Diagnosis not present

## 2017-12-24 DIAGNOSIS — R600 Localized edema: Secondary | ICD-10-CM | POA: Diagnosis not present

## 2017-12-24 DIAGNOSIS — E785 Hyperlipidemia, unspecified: Secondary | ICD-10-CM | POA: Diagnosis not present

## 2017-12-24 DIAGNOSIS — Z79891 Long term (current) use of opiate analgesic: Secondary | ICD-10-CM | POA: Diagnosis not present

## 2017-12-24 DIAGNOSIS — E559 Vitamin D deficiency, unspecified: Secondary | ICD-10-CM | POA: Diagnosis not present

## 2017-12-24 DIAGNOSIS — D539 Nutritional anemia, unspecified: Secondary | ICD-10-CM | POA: Diagnosis not present

## 2017-12-24 DIAGNOSIS — E1121 Type 2 diabetes mellitus with diabetic nephropathy: Secondary | ICD-10-CM | POA: Diagnosis not present

## 2018-01-07 DIAGNOSIS — R51 Headache: Secondary | ICD-10-CM | POA: Diagnosis not present

## 2018-01-07 DIAGNOSIS — E119 Type 2 diabetes mellitus without complications: Secondary | ICD-10-CM | POA: Diagnosis not present

## 2018-01-07 DIAGNOSIS — I10 Essential (primary) hypertension: Secondary | ICD-10-CM | POA: Diagnosis not present

## 2018-01-07 DIAGNOSIS — R112 Nausea with vomiting, unspecified: Secondary | ICD-10-CM | POA: Diagnosis not present

## 2018-01-21 DIAGNOSIS — Z79891 Long term (current) use of opiate analgesic: Secondary | ICD-10-CM | POA: Diagnosis not present

## 2018-01-21 DIAGNOSIS — E1121 Type 2 diabetes mellitus with diabetic nephropathy: Secondary | ICD-10-CM | POA: Diagnosis not present

## 2018-01-21 DIAGNOSIS — M545 Low back pain: Secondary | ICD-10-CM | POA: Diagnosis not present

## 2018-01-21 DIAGNOSIS — Z6831 Body mass index (BMI) 31.0-31.9, adult: Secondary | ICD-10-CM | POA: Diagnosis not present

## 2018-01-24 DIAGNOSIS — D500 Iron deficiency anemia secondary to blood loss (chronic): Secondary | ICD-10-CM | POA: Diagnosis not present

## 2018-01-27 DIAGNOSIS — K5903 Drug induced constipation: Secondary | ICD-10-CM | POA: Diagnosis not present

## 2018-01-27 DIAGNOSIS — D500 Iron deficiency anemia secondary to blood loss (chronic): Secondary | ICD-10-CM | POA: Diagnosis not present

## 2018-02-21 DIAGNOSIS — M545 Low back pain: Secondary | ICD-10-CM | POA: Diagnosis not present

## 2018-02-21 DIAGNOSIS — Z6830 Body mass index (BMI) 30.0-30.9, adult: Secondary | ICD-10-CM | POA: Diagnosis not present

## 2018-02-21 DIAGNOSIS — Z79891 Long term (current) use of opiate analgesic: Secondary | ICD-10-CM | POA: Diagnosis not present

## 2018-03-22 DIAGNOSIS — Z79891 Long term (current) use of opiate analgesic: Secondary | ICD-10-CM | POA: Diagnosis not present

## 2018-03-22 DIAGNOSIS — Z1331 Encounter for screening for depression: Secondary | ICD-10-CM | POA: Diagnosis not present

## 2018-03-22 DIAGNOSIS — M545 Low back pain: Secondary | ICD-10-CM | POA: Diagnosis not present

## 2018-03-22 DIAGNOSIS — Z9181 History of falling: Secondary | ICD-10-CM | POA: Diagnosis not present

## 2018-04-21 DIAGNOSIS — E1121 Type 2 diabetes mellitus with diabetic nephropathy: Secondary | ICD-10-CM | POA: Diagnosis not present

## 2018-04-21 DIAGNOSIS — M545 Low back pain: Secondary | ICD-10-CM | POA: Diagnosis not present

## 2018-04-21 DIAGNOSIS — E785 Hyperlipidemia, unspecified: Secondary | ICD-10-CM | POA: Diagnosis not present

## 2018-04-21 DIAGNOSIS — Z79891 Long term (current) use of opiate analgesic: Secondary | ICD-10-CM | POA: Diagnosis not present

## 2018-04-21 DIAGNOSIS — R51 Headache: Secondary | ICD-10-CM | POA: Diagnosis not present

## 2018-04-21 DIAGNOSIS — R112 Nausea with vomiting, unspecified: Secondary | ICD-10-CM | POA: Diagnosis not present

## 2018-04-21 DIAGNOSIS — Z1339 Encounter for screening examination for other mental health and behavioral disorders: Secondary | ICD-10-CM | POA: Diagnosis not present

## 2018-05-23 DIAGNOSIS — Z6828 Body mass index (BMI) 28.0-28.9, adult: Secondary | ICD-10-CM | POA: Diagnosis not present

## 2018-05-23 DIAGNOSIS — M545 Low back pain: Secondary | ICD-10-CM | POA: Diagnosis not present

## 2018-05-23 DIAGNOSIS — I10 Essential (primary) hypertension: Secondary | ICD-10-CM | POA: Diagnosis not present

## 2018-05-23 DIAGNOSIS — R51 Headache: Secondary | ICD-10-CM | POA: Diagnosis not present

## 2018-06-23 DIAGNOSIS — Z23 Encounter for immunization: Secondary | ICD-10-CM | POA: Diagnosis not present

## 2018-06-23 DIAGNOSIS — R51 Headache: Secondary | ICD-10-CM | POA: Diagnosis not present

## 2018-06-23 DIAGNOSIS — I10 Essential (primary) hypertension: Secondary | ICD-10-CM | POA: Diagnosis not present

## 2018-06-23 DIAGNOSIS — M545 Low back pain: Secondary | ICD-10-CM | POA: Diagnosis not present

## 2018-07-15 DIAGNOSIS — E119 Type 2 diabetes mellitus without complications: Secondary | ICD-10-CM | POA: Diagnosis not present

## 2018-07-22 DIAGNOSIS — J449 Chronic obstructive pulmonary disease, unspecified: Secondary | ICD-10-CM | POA: Diagnosis not present

## 2018-07-22 DIAGNOSIS — M545 Low back pain: Secondary | ICD-10-CM | POA: Diagnosis not present

## 2018-07-22 DIAGNOSIS — R05 Cough: Secondary | ICD-10-CM | POA: Diagnosis not present

## 2018-07-22 DIAGNOSIS — I10 Essential (primary) hypertension: Secondary | ICD-10-CM | POA: Diagnosis not present

## 2018-08-01 DIAGNOSIS — D500 Iron deficiency anemia secondary to blood loss (chronic): Secondary | ICD-10-CM | POA: Diagnosis not present

## 2018-08-23 DIAGNOSIS — I10 Essential (primary) hypertension: Secondary | ICD-10-CM | POA: Diagnosis not present

## 2018-08-23 DIAGNOSIS — Z6830 Body mass index (BMI) 30.0-30.9, adult: Secondary | ICD-10-CM | POA: Diagnosis not present

## 2018-08-23 DIAGNOSIS — M545 Low back pain: Secondary | ICD-10-CM | POA: Diagnosis not present

## 2018-08-23 DIAGNOSIS — E669 Obesity, unspecified: Secondary | ICD-10-CM | POA: Diagnosis not present

## 2018-09-22 DIAGNOSIS — I10 Essential (primary) hypertension: Secondary | ICD-10-CM | POA: Diagnosis not present

## 2018-09-22 DIAGNOSIS — J441 Chronic obstructive pulmonary disease with (acute) exacerbation: Secondary | ICD-10-CM | POA: Diagnosis not present

## 2018-09-22 DIAGNOSIS — M545 Low back pain: Secondary | ICD-10-CM | POA: Diagnosis not present

## 2018-10-09 DIAGNOSIS — I639 Cerebral infarction, unspecified: Secondary | ICD-10-CM | POA: Diagnosis not present

## 2018-10-09 DIAGNOSIS — I161 Hypertensive emergency: Secondary | ICD-10-CM | POA: Diagnosis not present

## 2018-10-09 DIAGNOSIS — I509 Heart failure, unspecified: Secondary | ICD-10-CM | POA: Diagnosis not present

## 2018-10-09 DIAGNOSIS — E871 Hypo-osmolality and hyponatremia: Secondary | ICD-10-CM | POA: Diagnosis not present

## 2018-10-09 DIAGNOSIS — K219 Gastro-esophageal reflux disease without esophagitis: Secondary | ICD-10-CM | POA: Diagnosis not present

## 2018-10-09 DIAGNOSIS — I6389 Other cerebral infarction: Secondary | ICD-10-CM | POA: Diagnosis not present

## 2018-10-09 DIAGNOSIS — F17210 Nicotine dependence, cigarettes, uncomplicated: Secondary | ICD-10-CM | POA: Diagnosis not present

## 2018-10-09 DIAGNOSIS — R29700 NIHSS score 0: Secondary | ICD-10-CM | POA: Diagnosis not present

## 2018-10-09 DIAGNOSIS — M1990 Unspecified osteoarthritis, unspecified site: Secondary | ICD-10-CM | POA: Diagnosis not present

## 2018-10-09 DIAGNOSIS — Z8673 Personal history of transient ischemic attack (TIA), and cerebral infarction without residual deficits: Secondary | ICD-10-CM | POA: Diagnosis not present

## 2018-10-09 DIAGNOSIS — N179 Acute kidney failure, unspecified: Secondary | ICD-10-CM | POA: Diagnosis not present

## 2018-10-09 DIAGNOSIS — D473 Essential (hemorrhagic) thrombocythemia: Secondary | ICD-10-CM | POA: Diagnosis not present

## 2018-10-09 DIAGNOSIS — I110 Hypertensive heart disease with heart failure: Secondary | ICD-10-CM | POA: Diagnosis not present

## 2018-10-09 DIAGNOSIS — I252 Old myocardial infarction: Secondary | ICD-10-CM | POA: Diagnosis not present

## 2018-10-09 DIAGNOSIS — G9341 Metabolic encephalopathy: Secondary | ICD-10-CM | POA: Diagnosis not present

## 2018-10-09 DIAGNOSIS — E876 Hypokalemia: Secondary | ICD-10-CM | POA: Diagnosis not present

## 2018-10-09 DIAGNOSIS — E1165 Type 2 diabetes mellitus with hyperglycemia: Secondary | ICD-10-CM | POA: Diagnosis not present

## 2018-10-09 DIAGNOSIS — Z880 Allergy status to penicillin: Secondary | ICD-10-CM | POA: Diagnosis not present

## 2018-10-09 DIAGNOSIS — Z79899 Other long term (current) drug therapy: Secondary | ICD-10-CM | POA: Diagnosis not present

## 2018-10-09 DIAGNOSIS — R569 Unspecified convulsions: Secondary | ICD-10-CM | POA: Diagnosis not present

## 2018-10-09 DIAGNOSIS — J449 Chronic obstructive pulmonary disease, unspecified: Secondary | ICD-10-CM | POA: Diagnosis not present

## 2018-10-09 DIAGNOSIS — E872 Acidosis: Secondary | ICD-10-CM | POA: Diagnosis not present

## 2018-10-10 DIAGNOSIS — I161 Hypertensive emergency: Secondary | ICD-10-CM | POA: Diagnosis not present

## 2018-10-10 DIAGNOSIS — E876 Hypokalemia: Secondary | ICD-10-CM | POA: Diagnosis not present

## 2018-10-10 DIAGNOSIS — I639 Cerebral infarction, unspecified: Secondary | ICD-10-CM | POA: Diagnosis not present

## 2018-10-10 DIAGNOSIS — E872 Acidosis: Secondary | ICD-10-CM | POA: Diagnosis not present

## 2018-10-10 DIAGNOSIS — R4182 Altered mental status, unspecified: Secondary | ICD-10-CM | POA: Diagnosis not present

## 2018-10-10 DIAGNOSIS — N179 Acute kidney failure, unspecified: Secondary | ICD-10-CM | POA: Diagnosis not present

## 2018-10-10 DIAGNOSIS — I63529 Cerebral infarction due to unspecified occlusion or stenosis of unspecified anterior cerebral artery: Secondary | ICD-10-CM | POA: Diagnosis not present

## 2018-10-10 DIAGNOSIS — R51 Headache: Secondary | ICD-10-CM | POA: Diagnosis not present

## 2018-10-10 DIAGNOSIS — E871 Hypo-osmolality and hyponatremia: Secondary | ICD-10-CM | POA: Diagnosis not present

## 2018-10-10 DIAGNOSIS — I517 Cardiomegaly: Secondary | ICD-10-CM | POA: Diagnosis not present

## 2018-10-10 DIAGNOSIS — G9341 Metabolic encephalopathy: Secondary | ICD-10-CM | POA: Diagnosis not present

## 2018-10-10 DIAGNOSIS — I6389 Other cerebral infarction: Secondary | ICD-10-CM | POA: Diagnosis not present

## 2018-10-11 DIAGNOSIS — N179 Acute kidney failure, unspecified: Secondary | ICD-10-CM | POA: Diagnosis not present

## 2018-10-11 DIAGNOSIS — E871 Hypo-osmolality and hyponatremia: Secondary | ICD-10-CM | POA: Diagnosis not present

## 2018-10-11 DIAGNOSIS — R51 Headache: Secondary | ICD-10-CM | POA: Diagnosis not present

## 2018-10-11 DIAGNOSIS — E876 Hypokalemia: Secondary | ICD-10-CM | POA: Diagnosis not present

## 2018-10-11 DIAGNOSIS — E872 Acidosis: Secondary | ICD-10-CM | POA: Diagnosis not present

## 2018-10-11 DIAGNOSIS — I6389 Other cerebral infarction: Secondary | ICD-10-CM | POA: Diagnosis not present

## 2018-10-11 DIAGNOSIS — I639 Cerebral infarction, unspecified: Secondary | ICD-10-CM | POA: Diagnosis not present

## 2018-10-11 DIAGNOSIS — G9341 Metabolic encephalopathy: Secondary | ICD-10-CM | POA: Diagnosis not present

## 2018-10-11 DIAGNOSIS — I161 Hypertensive emergency: Secondary | ICD-10-CM | POA: Diagnosis not present

## 2018-10-11 DIAGNOSIS — R4182 Altered mental status, unspecified: Secondary | ICD-10-CM | POA: Diagnosis not present

## 2018-10-12 DIAGNOSIS — G9341 Metabolic encephalopathy: Secondary | ICD-10-CM | POA: Diagnosis not present

## 2018-10-12 DIAGNOSIS — E872 Acidosis: Secondary | ICD-10-CM | POA: Diagnosis not present

## 2018-10-12 DIAGNOSIS — I639 Cerebral infarction, unspecified: Secondary | ICD-10-CM | POA: Diagnosis not present

## 2018-10-12 DIAGNOSIS — E871 Hypo-osmolality and hyponatremia: Secondary | ICD-10-CM | POA: Diagnosis not present

## 2018-10-12 DIAGNOSIS — R4182 Altered mental status, unspecified: Secondary | ICD-10-CM | POA: Diagnosis not present

## 2018-10-12 DIAGNOSIS — R51 Headache: Secondary | ICD-10-CM | POA: Diagnosis not present

## 2018-10-12 DIAGNOSIS — I6389 Other cerebral infarction: Secondary | ICD-10-CM | POA: Diagnosis not present

## 2018-10-12 DIAGNOSIS — N179 Acute kidney failure, unspecified: Secondary | ICD-10-CM | POA: Diagnosis not present

## 2018-10-12 DIAGNOSIS — E876 Hypokalemia: Secondary | ICD-10-CM | POA: Diagnosis not present

## 2018-10-12 DIAGNOSIS — I161 Hypertensive emergency: Secondary | ICD-10-CM | POA: Diagnosis not present

## 2018-10-13 DIAGNOSIS — I6389 Other cerebral infarction: Secondary | ICD-10-CM | POA: Diagnosis not present

## 2018-10-13 DIAGNOSIS — E872 Acidosis: Secondary | ICD-10-CM | POA: Diagnosis not present

## 2018-10-13 DIAGNOSIS — I639 Cerebral infarction, unspecified: Secondary | ICD-10-CM | POA: Diagnosis not present

## 2018-10-13 DIAGNOSIS — E876 Hypokalemia: Secondary | ICD-10-CM | POA: Diagnosis not present

## 2018-10-13 DIAGNOSIS — N179 Acute kidney failure, unspecified: Secondary | ICD-10-CM | POA: Diagnosis not present

## 2018-10-13 DIAGNOSIS — E871 Hypo-osmolality and hyponatremia: Secondary | ICD-10-CM | POA: Diagnosis not present

## 2018-10-13 DIAGNOSIS — I161 Hypertensive emergency: Secondary | ICD-10-CM | POA: Diagnosis not present

## 2018-10-13 DIAGNOSIS — R4182 Altered mental status, unspecified: Secondary | ICD-10-CM | POA: Diagnosis not present

## 2018-10-13 DIAGNOSIS — G9341 Metabolic encephalopathy: Secondary | ICD-10-CM | POA: Diagnosis not present

## 2018-10-13 DIAGNOSIS — R51 Headache: Secondary | ICD-10-CM | POA: Diagnosis not present

## 2018-10-14 DIAGNOSIS — E876 Hypokalemia: Secondary | ICD-10-CM | POA: Diagnosis not present

## 2018-10-14 DIAGNOSIS — G9341 Metabolic encephalopathy: Secondary | ICD-10-CM | POA: Diagnosis not present

## 2018-10-14 DIAGNOSIS — R51 Headache: Secondary | ICD-10-CM | POA: Diagnosis not present

## 2018-10-14 DIAGNOSIS — I639 Cerebral infarction, unspecified: Secondary | ICD-10-CM | POA: Diagnosis not present

## 2018-10-14 DIAGNOSIS — R531 Weakness: Secondary | ICD-10-CM | POA: Diagnosis not present

## 2018-10-14 DIAGNOSIS — R4182 Altered mental status, unspecified: Secondary | ICD-10-CM | POA: Diagnosis not present

## 2018-10-14 DIAGNOSIS — E871 Hypo-osmolality and hyponatremia: Secondary | ICD-10-CM | POA: Diagnosis not present

## 2018-10-14 DIAGNOSIS — N179 Acute kidney failure, unspecified: Secondary | ICD-10-CM | POA: Diagnosis not present

## 2018-10-14 DIAGNOSIS — E872 Acidosis: Secondary | ICD-10-CM | POA: Diagnosis not present

## 2018-10-14 DIAGNOSIS — Z743 Need for continuous supervision: Secondary | ICD-10-CM | POA: Diagnosis not present

## 2018-10-14 DIAGNOSIS — I161 Hypertensive emergency: Secondary | ICD-10-CM | POA: Diagnosis not present

## 2018-10-14 DIAGNOSIS — I6389 Other cerebral infarction: Secondary | ICD-10-CM | POA: Diagnosis not present

## 2018-10-16 DIAGNOSIS — E1165 Type 2 diabetes mellitus with hyperglycemia: Secondary | ICD-10-CM | POA: Diagnosis not present

## 2018-10-16 DIAGNOSIS — E785 Hyperlipidemia, unspecified: Secondary | ICD-10-CM | POA: Diagnosis not present

## 2018-10-16 DIAGNOSIS — J449 Chronic obstructive pulmonary disease, unspecified: Secondary | ICD-10-CM | POA: Diagnosis not present

## 2018-10-16 DIAGNOSIS — R569 Unspecified convulsions: Secondary | ICD-10-CM | POA: Diagnosis not present

## 2018-10-17 DIAGNOSIS — I152 Hypertension secondary to endocrine disorders: Secondary | ICD-10-CM | POA: Diagnosis not present

## 2018-10-17 DIAGNOSIS — R41841 Cognitive communication deficit: Secondary | ICD-10-CM | POA: Diagnosis not present

## 2018-10-17 DIAGNOSIS — M6281 Muscle weakness (generalized): Secondary | ICD-10-CM | POA: Diagnosis not present

## 2018-10-17 DIAGNOSIS — R262 Difficulty in walking, not elsewhere classified: Secondary | ICD-10-CM | POA: Diagnosis not present

## 2018-10-17 DIAGNOSIS — I63531 Cerebral infarction due to unspecified occlusion or stenosis of right posterior cerebral artery: Secondary | ICD-10-CM | POA: Diagnosis not present

## 2018-10-17 DIAGNOSIS — N182 Chronic kidney disease, stage 2 (mild): Secondary | ICD-10-CM | POA: Diagnosis not present

## 2018-10-17 DIAGNOSIS — R1311 Dysphagia, oral phase: Secondary | ICD-10-CM | POA: Diagnosis not present

## 2018-10-17 DIAGNOSIS — E1122 Type 2 diabetes mellitus with diabetic chronic kidney disease: Secondary | ICD-10-CM | POA: Diagnosis not present

## 2018-10-18 DIAGNOSIS — R1311 Dysphagia, oral phase: Secondary | ICD-10-CM | POA: Diagnosis not present

## 2018-10-18 DIAGNOSIS — R41841 Cognitive communication deficit: Secondary | ICD-10-CM | POA: Diagnosis not present

## 2018-10-18 DIAGNOSIS — M6281 Muscle weakness (generalized): Secondary | ICD-10-CM | POA: Diagnosis not present

## 2018-10-18 DIAGNOSIS — R262 Difficulty in walking, not elsewhere classified: Secondary | ICD-10-CM | POA: Diagnosis not present

## 2018-10-19 DIAGNOSIS — R41841 Cognitive communication deficit: Secondary | ICD-10-CM | POA: Diagnosis not present

## 2018-10-19 DIAGNOSIS — R262 Difficulty in walking, not elsewhere classified: Secondary | ICD-10-CM | POA: Diagnosis not present

## 2018-10-19 DIAGNOSIS — R1311 Dysphagia, oral phase: Secondary | ICD-10-CM | POA: Diagnosis not present

## 2018-10-19 DIAGNOSIS — M6281 Muscle weakness (generalized): Secondary | ICD-10-CM | POA: Diagnosis not present

## 2018-10-20 DIAGNOSIS — R262 Difficulty in walking, not elsewhere classified: Secondary | ICD-10-CM | POA: Diagnosis not present

## 2018-10-20 DIAGNOSIS — R41841 Cognitive communication deficit: Secondary | ICD-10-CM | POA: Diagnosis not present

## 2018-10-20 DIAGNOSIS — M6281 Muscle weakness (generalized): Secondary | ICD-10-CM | POA: Diagnosis not present

## 2018-10-20 DIAGNOSIS — R1311 Dysphagia, oral phase: Secondary | ICD-10-CM | POA: Diagnosis not present

## 2018-10-21 DIAGNOSIS — R1311 Dysphagia, oral phase: Secondary | ICD-10-CM | POA: Diagnosis not present

## 2018-10-21 DIAGNOSIS — R41841 Cognitive communication deficit: Secondary | ICD-10-CM | POA: Diagnosis not present

## 2018-10-21 DIAGNOSIS — M6281 Muscle weakness (generalized): Secondary | ICD-10-CM | POA: Diagnosis not present

## 2018-10-21 DIAGNOSIS — R262 Difficulty in walking, not elsewhere classified: Secondary | ICD-10-CM | POA: Diagnosis not present

## 2018-10-24 DIAGNOSIS — R262 Difficulty in walking, not elsewhere classified: Secondary | ICD-10-CM | POA: Diagnosis not present

## 2018-10-24 DIAGNOSIS — E1122 Type 2 diabetes mellitus with diabetic chronic kidney disease: Secondary | ICD-10-CM | POA: Diagnosis not present

## 2018-10-24 DIAGNOSIS — R41841 Cognitive communication deficit: Secondary | ICD-10-CM | POA: Diagnosis not present

## 2018-10-24 DIAGNOSIS — I152 Hypertension secondary to endocrine disorders: Secondary | ICD-10-CM | POA: Diagnosis not present

## 2018-10-24 DIAGNOSIS — R1311 Dysphagia, oral phase: Secondary | ICD-10-CM | POA: Diagnosis not present

## 2018-10-24 DIAGNOSIS — N182 Chronic kidney disease, stage 2 (mild): Secondary | ICD-10-CM | POA: Diagnosis not present

## 2018-10-24 DIAGNOSIS — M6281 Muscle weakness (generalized): Secondary | ICD-10-CM | POA: Diagnosis not present

## 2018-10-24 DIAGNOSIS — I63531 Cerebral infarction due to unspecified occlusion or stenosis of right posterior cerebral artery: Secondary | ICD-10-CM | POA: Diagnosis not present

## 2018-10-25 DIAGNOSIS — R262 Difficulty in walking, not elsewhere classified: Secondary | ICD-10-CM | POA: Diagnosis not present

## 2018-10-25 DIAGNOSIS — R41841 Cognitive communication deficit: Secondary | ICD-10-CM | POA: Diagnosis not present

## 2018-10-25 DIAGNOSIS — R1311 Dysphagia, oral phase: Secondary | ICD-10-CM | POA: Diagnosis not present

## 2018-10-25 DIAGNOSIS — M6281 Muscle weakness (generalized): Secondary | ICD-10-CM | POA: Diagnosis not present

## 2018-10-26 DIAGNOSIS — M6281 Muscle weakness (generalized): Secondary | ICD-10-CM | POA: Diagnosis not present

## 2018-10-26 DIAGNOSIS — R262 Difficulty in walking, not elsewhere classified: Secondary | ICD-10-CM | POA: Diagnosis not present

## 2018-10-26 DIAGNOSIS — R41841 Cognitive communication deficit: Secondary | ICD-10-CM | POA: Diagnosis not present

## 2018-10-26 DIAGNOSIS — R1311 Dysphagia, oral phase: Secondary | ICD-10-CM | POA: Diagnosis not present

## 2018-10-27 DIAGNOSIS — R41841 Cognitive communication deficit: Secondary | ICD-10-CM | POA: Diagnosis not present

## 2018-10-27 DIAGNOSIS — R262 Difficulty in walking, not elsewhere classified: Secondary | ICD-10-CM | POA: Diagnosis not present

## 2018-10-27 DIAGNOSIS — R1311 Dysphagia, oral phase: Secondary | ICD-10-CM | POA: Diagnosis not present

## 2018-10-27 DIAGNOSIS — M6281 Muscle weakness (generalized): Secondary | ICD-10-CM | POA: Diagnosis not present

## 2018-10-28 DIAGNOSIS — R41841 Cognitive communication deficit: Secondary | ICD-10-CM | POA: Diagnosis not present

## 2018-10-28 DIAGNOSIS — M6281 Muscle weakness (generalized): Secondary | ICD-10-CM | POA: Diagnosis not present

## 2018-10-28 DIAGNOSIS — R262 Difficulty in walking, not elsewhere classified: Secondary | ICD-10-CM | POA: Diagnosis not present

## 2018-10-28 DIAGNOSIS — R1311 Dysphagia, oral phase: Secondary | ICD-10-CM | POA: Diagnosis not present

## 2018-10-31 DIAGNOSIS — I63531 Cerebral infarction due to unspecified occlusion or stenosis of right posterior cerebral artery: Secondary | ICD-10-CM | POA: Diagnosis not present

## 2018-10-31 DIAGNOSIS — R262 Difficulty in walking, not elsewhere classified: Secondary | ICD-10-CM | POA: Diagnosis not present

## 2018-10-31 DIAGNOSIS — M6281 Muscle weakness (generalized): Secondary | ICD-10-CM | POA: Diagnosis not present

## 2018-10-31 DIAGNOSIS — R569 Unspecified convulsions: Secondary | ICD-10-CM | POA: Diagnosis not present

## 2018-10-31 DIAGNOSIS — I152 Hypertension secondary to endocrine disorders: Secondary | ICD-10-CM | POA: Diagnosis not present

## 2018-10-31 DIAGNOSIS — R1311 Dysphagia, oral phase: Secondary | ICD-10-CM | POA: Diagnosis not present

## 2018-10-31 DIAGNOSIS — E1122 Type 2 diabetes mellitus with diabetic chronic kidney disease: Secondary | ICD-10-CM | POA: Diagnosis not present

## 2018-10-31 DIAGNOSIS — R41841 Cognitive communication deficit: Secondary | ICD-10-CM | POA: Diagnosis not present

## 2018-11-01 DIAGNOSIS — M6281 Muscle weakness (generalized): Secondary | ICD-10-CM | POA: Diagnosis not present

## 2018-11-01 DIAGNOSIS — R1311 Dysphagia, oral phase: Secondary | ICD-10-CM | POA: Diagnosis not present

## 2018-11-01 DIAGNOSIS — R41841 Cognitive communication deficit: Secondary | ICD-10-CM | POA: Diagnosis not present

## 2018-11-01 DIAGNOSIS — R262 Difficulty in walking, not elsewhere classified: Secondary | ICD-10-CM | POA: Diagnosis not present

## 2018-11-02 DIAGNOSIS — R1311 Dysphagia, oral phase: Secondary | ICD-10-CM | POA: Diagnosis not present

## 2018-11-02 DIAGNOSIS — R41841 Cognitive communication deficit: Secondary | ICD-10-CM | POA: Diagnosis not present

## 2018-11-02 DIAGNOSIS — M6281 Muscle weakness (generalized): Secondary | ICD-10-CM | POA: Diagnosis not present

## 2018-11-02 DIAGNOSIS — R262 Difficulty in walking, not elsewhere classified: Secondary | ICD-10-CM | POA: Diagnosis not present

## 2018-11-03 DIAGNOSIS — R1311 Dysphagia, oral phase: Secondary | ICD-10-CM | POA: Diagnosis not present

## 2018-11-03 DIAGNOSIS — M6281 Muscle weakness (generalized): Secondary | ICD-10-CM | POA: Diagnosis not present

## 2018-11-03 DIAGNOSIS — R262 Difficulty in walking, not elsewhere classified: Secondary | ICD-10-CM | POA: Diagnosis not present

## 2018-11-03 DIAGNOSIS — R41841 Cognitive communication deficit: Secondary | ICD-10-CM | POA: Diagnosis not present

## 2018-11-04 DIAGNOSIS — M6281 Muscle weakness (generalized): Secondary | ICD-10-CM | POA: Diagnosis not present

## 2018-11-04 DIAGNOSIS — R262 Difficulty in walking, not elsewhere classified: Secondary | ICD-10-CM | POA: Diagnosis not present

## 2018-11-04 DIAGNOSIS — R1311 Dysphagia, oral phase: Secondary | ICD-10-CM | POA: Diagnosis not present

## 2018-11-04 DIAGNOSIS — R41841 Cognitive communication deficit: Secondary | ICD-10-CM | POA: Diagnosis not present

## 2018-11-07 DIAGNOSIS — R41841 Cognitive communication deficit: Secondary | ICD-10-CM | POA: Diagnosis not present

## 2018-11-07 DIAGNOSIS — R1311 Dysphagia, oral phase: Secondary | ICD-10-CM | POA: Diagnosis not present

## 2018-11-07 DIAGNOSIS — M6281 Muscle weakness (generalized): Secondary | ICD-10-CM | POA: Diagnosis not present

## 2018-11-07 DIAGNOSIS — R262 Difficulty in walking, not elsewhere classified: Secondary | ICD-10-CM | POA: Diagnosis not present

## 2018-11-08 DIAGNOSIS — M6281 Muscle weakness (generalized): Secondary | ICD-10-CM | POA: Diagnosis not present

## 2018-11-08 DIAGNOSIS — R41841 Cognitive communication deficit: Secondary | ICD-10-CM | POA: Diagnosis not present

## 2018-11-08 DIAGNOSIS — R262 Difficulty in walking, not elsewhere classified: Secondary | ICD-10-CM | POA: Diagnosis not present

## 2018-11-08 DIAGNOSIS — R1311 Dysphagia, oral phase: Secondary | ICD-10-CM | POA: Diagnosis not present

## 2018-11-09 DIAGNOSIS — M6281 Muscle weakness (generalized): Secondary | ICD-10-CM | POA: Diagnosis not present

## 2018-11-09 DIAGNOSIS — R41841 Cognitive communication deficit: Secondary | ICD-10-CM | POA: Diagnosis not present

## 2018-11-09 DIAGNOSIS — R262 Difficulty in walking, not elsewhere classified: Secondary | ICD-10-CM | POA: Diagnosis not present

## 2018-11-09 DIAGNOSIS — R1311 Dysphagia, oral phase: Secondary | ICD-10-CM | POA: Diagnosis not present

## 2018-11-10 DIAGNOSIS — R1311 Dysphagia, oral phase: Secondary | ICD-10-CM | POA: Diagnosis not present

## 2018-11-10 DIAGNOSIS — R41841 Cognitive communication deficit: Secondary | ICD-10-CM | POA: Diagnosis not present

## 2018-11-10 DIAGNOSIS — R569 Unspecified convulsions: Secondary | ICD-10-CM | POA: Diagnosis not present

## 2018-11-10 DIAGNOSIS — M6281 Muscle weakness (generalized): Secondary | ICD-10-CM | POA: Diagnosis not present

## 2018-11-10 DIAGNOSIS — E1122 Type 2 diabetes mellitus with diabetic chronic kidney disease: Secondary | ICD-10-CM | POA: Diagnosis not present

## 2018-11-10 DIAGNOSIS — I63531 Cerebral infarction due to unspecified occlusion or stenosis of right posterior cerebral artery: Secondary | ICD-10-CM | POA: Diagnosis not present

## 2018-11-10 DIAGNOSIS — R262 Difficulty in walking, not elsewhere classified: Secondary | ICD-10-CM | POA: Diagnosis not present

## 2018-11-10 DIAGNOSIS — I152 Hypertension secondary to endocrine disorders: Secondary | ICD-10-CM | POA: Diagnosis not present

## 2018-11-11 DIAGNOSIS — R41841 Cognitive communication deficit: Secondary | ICD-10-CM | POA: Diagnosis not present

## 2018-11-11 DIAGNOSIS — R262 Difficulty in walking, not elsewhere classified: Secondary | ICD-10-CM | POA: Diagnosis not present

## 2018-11-11 DIAGNOSIS — R1311 Dysphagia, oral phase: Secondary | ICD-10-CM | POA: Diagnosis not present

## 2018-11-11 DIAGNOSIS — M6281 Muscle weakness (generalized): Secondary | ICD-10-CM | POA: Diagnosis not present

## 2018-11-13 DIAGNOSIS — E785 Hyperlipidemia, unspecified: Secondary | ICD-10-CM | POA: Diagnosis not present

## 2018-11-13 DIAGNOSIS — E1165 Type 2 diabetes mellitus with hyperglycemia: Secondary | ICD-10-CM | POA: Diagnosis not present

## 2018-11-13 DIAGNOSIS — J449 Chronic obstructive pulmonary disease, unspecified: Secondary | ICD-10-CM | POA: Diagnosis not present

## 2018-11-14 DIAGNOSIS — M6281 Muscle weakness (generalized): Secondary | ICD-10-CM | POA: Diagnosis not present

## 2018-11-14 DIAGNOSIS — I63531 Cerebral infarction due to unspecified occlusion or stenosis of right posterior cerebral artery: Secondary | ICD-10-CM | POA: Diagnosis not present

## 2018-11-14 DIAGNOSIS — R41841 Cognitive communication deficit: Secondary | ICD-10-CM | POA: Diagnosis not present

## 2018-11-14 DIAGNOSIS — R1311 Dysphagia, oral phase: Secondary | ICD-10-CM | POA: Diagnosis not present

## 2018-11-14 DIAGNOSIS — E1122 Type 2 diabetes mellitus with diabetic chronic kidney disease: Secondary | ICD-10-CM | POA: Diagnosis not present

## 2018-11-14 DIAGNOSIS — I152 Hypertension secondary to endocrine disorders: Secondary | ICD-10-CM | POA: Diagnosis not present

## 2018-11-14 DIAGNOSIS — R262 Difficulty in walking, not elsewhere classified: Secondary | ICD-10-CM | POA: Diagnosis not present

## 2018-11-14 DIAGNOSIS — R569 Unspecified convulsions: Secondary | ICD-10-CM | POA: Diagnosis not present

## 2018-11-15 DIAGNOSIS — R262 Difficulty in walking, not elsewhere classified: Secondary | ICD-10-CM | POA: Diagnosis not present

## 2018-11-15 DIAGNOSIS — R1311 Dysphagia, oral phase: Secondary | ICD-10-CM | POA: Diagnosis not present

## 2018-11-15 DIAGNOSIS — M6281 Muscle weakness (generalized): Secondary | ICD-10-CM | POA: Diagnosis not present

## 2018-11-15 DIAGNOSIS — R41841 Cognitive communication deficit: Secondary | ICD-10-CM | POA: Diagnosis not present

## 2018-11-16 DIAGNOSIS — R1311 Dysphagia, oral phase: Secondary | ICD-10-CM | POA: Diagnosis not present

## 2018-11-16 DIAGNOSIS — R41841 Cognitive communication deficit: Secondary | ICD-10-CM | POA: Diagnosis not present

## 2018-11-16 DIAGNOSIS — M6281 Muscle weakness (generalized): Secondary | ICD-10-CM | POA: Diagnosis not present

## 2018-11-16 DIAGNOSIS — R262 Difficulty in walking, not elsewhere classified: Secondary | ICD-10-CM | POA: Diagnosis not present

## 2018-11-17 DIAGNOSIS — M6281 Muscle weakness (generalized): Secondary | ICD-10-CM | POA: Diagnosis not present

## 2018-11-17 DIAGNOSIS — R1311 Dysphagia, oral phase: Secondary | ICD-10-CM | POA: Diagnosis not present

## 2018-11-17 DIAGNOSIS — R41841 Cognitive communication deficit: Secondary | ICD-10-CM | POA: Diagnosis not present

## 2018-11-17 DIAGNOSIS — R262 Difficulty in walking, not elsewhere classified: Secondary | ICD-10-CM | POA: Diagnosis not present

## 2018-11-18 DIAGNOSIS — I63531 Cerebral infarction due to unspecified occlusion or stenosis of right posterior cerebral artery: Secondary | ICD-10-CM | POA: Diagnosis not present

## 2018-11-18 DIAGNOSIS — I152 Hypertension secondary to endocrine disorders: Secondary | ICD-10-CM | POA: Diagnosis not present

## 2018-11-18 DIAGNOSIS — R569 Unspecified convulsions: Secondary | ICD-10-CM | POA: Diagnosis not present

## 2018-11-18 DIAGNOSIS — E1122 Type 2 diabetes mellitus with diabetic chronic kidney disease: Secondary | ICD-10-CM | POA: Diagnosis not present

## 2018-11-21 DIAGNOSIS — I10 Essential (primary) hypertension: Secondary | ICD-10-CM | POA: Diagnosis not present

## 2018-11-21 DIAGNOSIS — E785 Hyperlipidemia, unspecified: Secondary | ICD-10-CM | POA: Diagnosis not present

## 2018-11-21 DIAGNOSIS — F17210 Nicotine dependence, cigarettes, uncomplicated: Secondary | ICD-10-CM | POA: Diagnosis not present

## 2018-11-21 DIAGNOSIS — Z79891 Long term (current) use of opiate analgesic: Secondary | ICD-10-CM | POA: Diagnosis not present

## 2018-11-21 DIAGNOSIS — J449 Chronic obstructive pulmonary disease, unspecified: Secondary | ICD-10-CM | POA: Diagnosis not present

## 2018-11-21 DIAGNOSIS — Z79899 Other long term (current) drug therapy: Secondary | ICD-10-CM | POA: Diagnosis not present

## 2018-11-21 DIAGNOSIS — Z7982 Long term (current) use of aspirin: Secondary | ICD-10-CM | POA: Diagnosis not present

## 2018-11-21 DIAGNOSIS — Z9181 History of falling: Secondary | ICD-10-CM | POA: Diagnosis not present

## 2018-11-21 DIAGNOSIS — K219 Gastro-esophageal reflux disease without esophagitis: Secondary | ICD-10-CM | POA: Diagnosis not present

## 2018-11-21 DIAGNOSIS — K581 Irritable bowel syndrome with constipation: Secondary | ICD-10-CM | POA: Diagnosis not present

## 2018-11-21 DIAGNOSIS — I639 Cerebral infarction, unspecified: Secondary | ICD-10-CM | POA: Diagnosis not present

## 2018-11-21 DIAGNOSIS — E1165 Type 2 diabetes mellitus with hyperglycemia: Secondary | ICD-10-CM | POA: Diagnosis not present

## 2018-11-22 DIAGNOSIS — Z79899 Other long term (current) drug therapy: Secondary | ICD-10-CM | POA: Diagnosis not present

## 2018-11-22 DIAGNOSIS — I6990 Unspecified sequelae of unspecified cerebrovascular disease: Secondary | ICD-10-CM | POA: Diagnosis not present

## 2018-11-22 DIAGNOSIS — G40909 Epilepsy, unspecified, not intractable, without status epilepticus: Secondary | ICD-10-CM | POA: Diagnosis not present

## 2018-11-22 DIAGNOSIS — E1121 Type 2 diabetes mellitus with diabetic nephropathy: Secondary | ICD-10-CM | POA: Diagnosis not present

## 2018-11-29 DIAGNOSIS — E1165 Type 2 diabetes mellitus with hyperglycemia: Secondary | ICD-10-CM | POA: Diagnosis not present

## 2018-11-29 DIAGNOSIS — I639 Cerebral infarction, unspecified: Secondary | ICD-10-CM | POA: Diagnosis not present

## 2018-11-29 DIAGNOSIS — K581 Irritable bowel syndrome with constipation: Secondary | ICD-10-CM | POA: Diagnosis not present

## 2018-11-29 DIAGNOSIS — J449 Chronic obstructive pulmonary disease, unspecified: Secondary | ICD-10-CM | POA: Diagnosis not present

## 2018-11-29 DIAGNOSIS — K219 Gastro-esophageal reflux disease without esophagitis: Secondary | ICD-10-CM | POA: Diagnosis not present

## 2018-11-29 DIAGNOSIS — I10 Essential (primary) hypertension: Secondary | ICD-10-CM | POA: Diagnosis not present

## 2018-12-09 DIAGNOSIS — K219 Gastro-esophageal reflux disease without esophagitis: Secondary | ICD-10-CM | POA: Diagnosis not present

## 2018-12-09 DIAGNOSIS — I639 Cerebral infarction, unspecified: Secondary | ICD-10-CM | POA: Diagnosis not present

## 2018-12-09 DIAGNOSIS — K581 Irritable bowel syndrome with constipation: Secondary | ICD-10-CM | POA: Diagnosis not present

## 2018-12-09 DIAGNOSIS — J449 Chronic obstructive pulmonary disease, unspecified: Secondary | ICD-10-CM | POA: Diagnosis not present

## 2018-12-09 DIAGNOSIS — E1165 Type 2 diabetes mellitus with hyperglycemia: Secondary | ICD-10-CM | POA: Diagnosis not present

## 2018-12-09 DIAGNOSIS — I10 Essential (primary) hypertension: Secondary | ICD-10-CM | POA: Diagnosis not present

## 2018-12-13 DIAGNOSIS — K581 Irritable bowel syndrome with constipation: Secondary | ICD-10-CM | POA: Diagnosis not present

## 2018-12-13 DIAGNOSIS — I639 Cerebral infarction, unspecified: Secondary | ICD-10-CM | POA: Diagnosis not present

## 2018-12-13 DIAGNOSIS — E1165 Type 2 diabetes mellitus with hyperglycemia: Secondary | ICD-10-CM | POA: Diagnosis not present

## 2018-12-13 DIAGNOSIS — I10 Essential (primary) hypertension: Secondary | ICD-10-CM | POA: Diagnosis not present

## 2018-12-13 DIAGNOSIS — J449 Chronic obstructive pulmonary disease, unspecified: Secondary | ICD-10-CM | POA: Diagnosis not present

## 2018-12-13 DIAGNOSIS — K219 Gastro-esophageal reflux disease without esophagitis: Secondary | ICD-10-CM | POA: Diagnosis not present

## 2018-12-21 DIAGNOSIS — Z79891 Long term (current) use of opiate analgesic: Secondary | ICD-10-CM | POA: Diagnosis not present

## 2018-12-21 DIAGNOSIS — J449 Chronic obstructive pulmonary disease, unspecified: Secondary | ICD-10-CM | POA: Diagnosis not present

## 2018-12-21 DIAGNOSIS — K219 Gastro-esophageal reflux disease without esophagitis: Secondary | ICD-10-CM | POA: Diagnosis not present

## 2018-12-21 DIAGNOSIS — F17210 Nicotine dependence, cigarettes, uncomplicated: Secondary | ICD-10-CM | POA: Diagnosis not present

## 2018-12-21 DIAGNOSIS — Z79899 Other long term (current) drug therapy: Secondary | ICD-10-CM | POA: Diagnosis not present

## 2018-12-21 DIAGNOSIS — E1165 Type 2 diabetes mellitus with hyperglycemia: Secondary | ICD-10-CM | POA: Diagnosis not present

## 2018-12-21 DIAGNOSIS — Z9181 History of falling: Secondary | ICD-10-CM | POA: Diagnosis not present

## 2018-12-21 DIAGNOSIS — I639 Cerebral infarction, unspecified: Secondary | ICD-10-CM | POA: Diagnosis not present

## 2018-12-21 DIAGNOSIS — I10 Essential (primary) hypertension: Secondary | ICD-10-CM | POA: Diagnosis not present

## 2018-12-21 DIAGNOSIS — K581 Irritable bowel syndrome with constipation: Secondary | ICD-10-CM | POA: Diagnosis not present

## 2018-12-21 DIAGNOSIS — E785 Hyperlipidemia, unspecified: Secondary | ICD-10-CM | POA: Diagnosis not present

## 2018-12-21 DIAGNOSIS — Z7982 Long term (current) use of aspirin: Secondary | ICD-10-CM | POA: Diagnosis not present

## 2019-01-03 DIAGNOSIS — I639 Cerebral infarction, unspecified: Secondary | ICD-10-CM | POA: Diagnosis not present

## 2019-01-03 DIAGNOSIS — K219 Gastro-esophageal reflux disease without esophagitis: Secondary | ICD-10-CM | POA: Diagnosis not present

## 2019-01-03 DIAGNOSIS — E1165 Type 2 diabetes mellitus with hyperglycemia: Secondary | ICD-10-CM | POA: Diagnosis not present

## 2019-01-03 DIAGNOSIS — I10 Essential (primary) hypertension: Secondary | ICD-10-CM | POA: Diagnosis not present

## 2019-01-03 DIAGNOSIS — K581 Irritable bowel syndrome with constipation: Secondary | ICD-10-CM | POA: Diagnosis not present

## 2019-01-03 DIAGNOSIS — J449 Chronic obstructive pulmonary disease, unspecified: Secondary | ICD-10-CM | POA: Diagnosis not present

## 2019-01-16 DIAGNOSIS — K219 Gastro-esophageal reflux disease without esophagitis: Secondary | ICD-10-CM | POA: Diagnosis not present

## 2019-01-16 DIAGNOSIS — J449 Chronic obstructive pulmonary disease, unspecified: Secondary | ICD-10-CM | POA: Diagnosis not present

## 2019-01-16 DIAGNOSIS — K581 Irritable bowel syndrome with constipation: Secondary | ICD-10-CM | POA: Diagnosis not present

## 2019-01-16 DIAGNOSIS — I639 Cerebral infarction, unspecified: Secondary | ICD-10-CM | POA: Diagnosis not present

## 2019-01-16 DIAGNOSIS — I10 Essential (primary) hypertension: Secondary | ICD-10-CM | POA: Diagnosis not present

## 2019-01-16 DIAGNOSIS — E1165 Type 2 diabetes mellitus with hyperglycemia: Secondary | ICD-10-CM | POA: Diagnosis not present

## 2019-01-17 DIAGNOSIS — I6990 Unspecified sequelae of unspecified cerebrovascular disease: Secondary | ICD-10-CM | POA: Diagnosis not present

## 2019-01-17 DIAGNOSIS — E785 Hyperlipidemia, unspecified: Secondary | ICD-10-CM | POA: Diagnosis not present

## 2019-01-17 DIAGNOSIS — Z139 Encounter for screening, unspecified: Secondary | ICD-10-CM | POA: Diagnosis not present

## 2019-01-17 DIAGNOSIS — F17200 Nicotine dependence, unspecified, uncomplicated: Secondary | ICD-10-CM | POA: Diagnosis not present

## 2019-01-17 DIAGNOSIS — E1121 Type 2 diabetes mellitus with diabetic nephropathy: Secondary | ICD-10-CM | POA: Diagnosis not present

## 2019-02-17 DIAGNOSIS — G8929 Other chronic pain: Secondary | ICD-10-CM | POA: Diagnosis not present

## 2019-02-17 DIAGNOSIS — G40909 Epilepsy, unspecified, not intractable, without status epilepticus: Secondary | ICD-10-CM | POA: Diagnosis not present

## 2019-02-17 DIAGNOSIS — J441 Chronic obstructive pulmonary disease with (acute) exacerbation: Secondary | ICD-10-CM | POA: Diagnosis not present

## 2019-02-17 DIAGNOSIS — M545 Low back pain: Secondary | ICD-10-CM | POA: Diagnosis not present

## 2019-03-23 DIAGNOSIS — M545 Low back pain: Secondary | ICD-10-CM | POA: Diagnosis not present

## 2019-03-23 DIAGNOSIS — I6990 Unspecified sequelae of unspecified cerebrovascular disease: Secondary | ICD-10-CM | POA: Diagnosis not present

## 2019-03-23 DIAGNOSIS — G40909 Epilepsy, unspecified, not intractable, without status epilepticus: Secondary | ICD-10-CM | POA: Diagnosis not present

## 2019-03-23 DIAGNOSIS — E669 Obesity, unspecified: Secondary | ICD-10-CM | POA: Diagnosis not present

## 2019-03-23 DIAGNOSIS — G8929 Other chronic pain: Secondary | ICD-10-CM | POA: Diagnosis not present

## 2019-04-21 DIAGNOSIS — Z79891 Long term (current) use of opiate analgesic: Secondary | ICD-10-CM | POA: Diagnosis not present

## 2019-04-21 DIAGNOSIS — M545 Low back pain: Secondary | ICD-10-CM | POA: Diagnosis not present

## 2019-04-21 DIAGNOSIS — E669 Obesity, unspecified: Secondary | ICD-10-CM | POA: Diagnosis not present

## 2019-04-21 DIAGNOSIS — G8929 Other chronic pain: Secondary | ICD-10-CM | POA: Diagnosis not present

## 2019-05-03 DIAGNOSIS — Z9181 History of falling: Secondary | ICD-10-CM | POA: Diagnosis not present

## 2019-05-03 DIAGNOSIS — Z0000 Encounter for general adult medical examination without abnormal findings: Secondary | ICD-10-CM | POA: Diagnosis not present

## 2019-05-03 DIAGNOSIS — Z6830 Body mass index (BMI) 30.0-30.9, adult: Secondary | ICD-10-CM | POA: Diagnosis not present

## 2019-05-03 DIAGNOSIS — Z1231 Encounter for screening mammogram for malignant neoplasm of breast: Secondary | ICD-10-CM | POA: Diagnosis not present

## 2019-05-03 DIAGNOSIS — E785 Hyperlipidemia, unspecified: Secondary | ICD-10-CM | POA: Diagnosis not present

## 2019-05-03 DIAGNOSIS — N959 Unspecified menopausal and perimenopausal disorder: Secondary | ICD-10-CM | POA: Diagnosis not present

## 2019-05-03 DIAGNOSIS — Z1331 Encounter for screening for depression: Secondary | ICD-10-CM | POA: Diagnosis not present

## 2019-05-03 DIAGNOSIS — Z136 Encounter for screening for cardiovascular disorders: Secondary | ICD-10-CM | POA: Diagnosis not present

## 2019-05-03 DIAGNOSIS — E669 Obesity, unspecified: Secondary | ICD-10-CM | POA: Diagnosis not present

## 2019-05-25 DIAGNOSIS — M545 Low back pain: Secondary | ICD-10-CM | POA: Diagnosis not present

## 2019-05-25 DIAGNOSIS — E785 Hyperlipidemia, unspecified: Secondary | ICD-10-CM | POA: Diagnosis not present

## 2019-05-25 DIAGNOSIS — Z139 Encounter for screening, unspecified: Secondary | ICD-10-CM | POA: Diagnosis not present

## 2019-05-25 DIAGNOSIS — E1121 Type 2 diabetes mellitus with diabetic nephropathy: Secondary | ICD-10-CM | POA: Diagnosis not present

## 2019-05-25 DIAGNOSIS — G8929 Other chronic pain: Secondary | ICD-10-CM | POA: Diagnosis not present

## 2019-05-25 DIAGNOSIS — F17200 Nicotine dependence, unspecified, uncomplicated: Secondary | ICD-10-CM | POA: Diagnosis not present

## 2019-11-19 ENCOUNTER — Other Ambulatory Visit: Payer: Self-pay

## 2019-11-19 ENCOUNTER — Inpatient Hospital Stay (HOSPITAL_COMMUNITY)
Admission: EM | Admit: 2019-11-19 | Discharge: 2019-12-29 | DRG: 003 | Disposition: A | Discharge status: Other Acute Inpatient Hospital | Payer: Medicare Other | Attending: Neurosurgery | Admitting: Neurosurgery

## 2019-11-19 ENCOUNTER — Emergency Department (HOSPITAL_COMMUNITY): Payer: Medicare Other

## 2019-11-19 ENCOUNTER — Encounter (HOSPITAL_COMMUNITY): Payer: Self-pay | Admitting: Primary Care

## 2019-11-19 DIAGNOSIS — Z79899 Other long term (current) drug therapy: Secondary | ICD-10-CM

## 2019-11-19 DIAGNOSIS — J9690 Respiratory failure, unspecified, unspecified whether with hypoxia or hypercapnia: Secondary | ICD-10-CM

## 2019-11-19 DIAGNOSIS — E878 Other disorders of electrolyte and fluid balance, not elsewhere classified: Secondary | ICD-10-CM | POA: Diagnosis present

## 2019-11-19 DIAGNOSIS — E1122 Type 2 diabetes mellitus with diabetic chronic kidney disease: Secondary | ICD-10-CM | POA: Diagnosis present

## 2019-11-19 DIAGNOSIS — G8331 Monoplegia, unspecified affecting right dominant side: Secondary | ICD-10-CM | POA: Diagnosis present

## 2019-11-19 DIAGNOSIS — E1165 Type 2 diabetes mellitus with hyperglycemia: Secondary | ICD-10-CM | POA: Diagnosis present

## 2019-11-19 DIAGNOSIS — J690 Pneumonitis due to inhalation of food and vomit: Secondary | ICD-10-CM | POA: Diagnosis not present

## 2019-11-19 DIAGNOSIS — R29810 Facial weakness: Secondary | ICD-10-CM | POA: Diagnosis present

## 2019-11-19 DIAGNOSIS — I509 Heart failure, unspecified: Secondary | ICD-10-CM | POA: Diagnosis present

## 2019-11-19 DIAGNOSIS — R4701 Aphasia: Secondary | ICD-10-CM | POA: Diagnosis present

## 2019-11-19 DIAGNOSIS — Z20822 Contact with and (suspected) exposure to covid-19: Secondary | ICD-10-CM | POA: Diagnosis present

## 2019-11-19 DIAGNOSIS — Z9889 Other specified postprocedural states: Secondary | ICD-10-CM | POA: Diagnosis not present

## 2019-11-19 DIAGNOSIS — I82623 Acute embolism and thrombosis of deep veins of upper extremity, bilateral: Secondary | ICD-10-CM | POA: Diagnosis not present

## 2019-11-19 DIAGNOSIS — R29725 NIHSS score 25: Secondary | ICD-10-CM | POA: Diagnosis present

## 2019-11-19 DIAGNOSIS — G40909 Epilepsy, unspecified, not intractable, without status epilepticus: Secondary | ICD-10-CM | POA: Diagnosis present

## 2019-11-19 DIAGNOSIS — N179 Acute kidney failure, unspecified: Secondary | ICD-10-CM

## 2019-11-19 DIAGNOSIS — R069 Unspecified abnormalities of breathing: Secondary | ICD-10-CM

## 2019-11-19 DIAGNOSIS — N183 Chronic kidney disease, stage 3 unspecified: Secondary | ICD-10-CM | POA: Diagnosis present

## 2019-11-19 DIAGNOSIS — Z880 Allergy status to penicillin: Secondary | ICD-10-CM

## 2019-11-19 DIAGNOSIS — I639 Cerebral infarction, unspecified: Secondary | ICD-10-CM | POA: Diagnosis not present

## 2019-11-19 DIAGNOSIS — Z781 Physical restraint status: Secondary | ICD-10-CM

## 2019-11-19 DIAGNOSIS — I959 Hypotension, unspecified: Secondary | ICD-10-CM | POA: Diagnosis not present

## 2019-11-19 DIAGNOSIS — I609 Nontraumatic subarachnoid hemorrhage, unspecified: Secondary | ICD-10-CM

## 2019-11-19 DIAGNOSIS — Z7982 Long term (current) use of aspirin: Secondary | ICD-10-CM

## 2019-11-19 DIAGNOSIS — Z930 Tracheostomy status: Secondary | ICD-10-CM

## 2019-11-19 DIAGNOSIS — I608 Other nontraumatic subarachnoid hemorrhage: Secondary | ICD-10-CM | POA: Diagnosis present

## 2019-11-19 DIAGNOSIS — E11649 Type 2 diabetes mellitus with hypoglycemia without coma: Secondary | ICD-10-CM | POA: Diagnosis not present

## 2019-11-19 DIAGNOSIS — G911 Obstructive hydrocephalus: Secondary | ICD-10-CM | POA: Diagnosis present

## 2019-11-19 DIAGNOSIS — I6200 Nontraumatic subdural hemorrhage, unspecified: Secondary | ICD-10-CM | POA: Diagnosis present

## 2019-11-19 DIAGNOSIS — I6012 Nontraumatic subarachnoid hemorrhage from left middle cerebral artery: Principal | ICD-10-CM | POA: Diagnosis present

## 2019-11-19 DIAGNOSIS — R5381 Other malaise: Secondary | ICD-10-CM | POA: Diagnosis present

## 2019-11-19 DIAGNOSIS — Z8673 Personal history of transient ischemic attack (TIA), and cerebral infarction without residual deficits: Secondary | ICD-10-CM

## 2019-11-19 DIAGNOSIS — Z978 Presence of other specified devices: Secondary | ICD-10-CM

## 2019-11-19 DIAGNOSIS — I130 Hypertensive heart and chronic kidney disease with heart failure and stage 1 through stage 4 chronic kidney disease, or unspecified chronic kidney disease: Secondary | ICD-10-CM | POA: Diagnosis present

## 2019-11-19 DIAGNOSIS — E875 Hyperkalemia: Secondary | ICD-10-CM | POA: Diagnosis present

## 2019-11-19 DIAGNOSIS — Z9911 Dependence on respirator [ventilator] status: Secondary | ICD-10-CM

## 2019-11-19 DIAGNOSIS — J9621 Acute and chronic respiratory failure with hypoxia: Secondary | ICD-10-CM | POA: Diagnosis not present

## 2019-11-19 DIAGNOSIS — R451 Restlessness and agitation: Secondary | ICD-10-CM | POA: Diagnosis present

## 2019-11-19 DIAGNOSIS — E870 Hyperosmolality and hypernatremia: Secondary | ICD-10-CM | POA: Diagnosis present

## 2019-11-19 DIAGNOSIS — R509 Fever, unspecified: Secondary | ICD-10-CM | POA: Diagnosis not present

## 2019-11-19 DIAGNOSIS — Z79891 Long term (current) use of opiate analgesic: Secondary | ICD-10-CM

## 2019-11-19 DIAGNOSIS — R471 Dysarthria and anarthria: Secondary | ICD-10-CM | POA: Diagnosis present

## 2019-11-19 DIAGNOSIS — R1310 Dysphagia, unspecified: Secondary | ICD-10-CM | POA: Diagnosis present

## 2019-11-19 DIAGNOSIS — Z4659 Encounter for fitting and adjustment of other gastrointestinal appliance and device: Secondary | ICD-10-CM

## 2019-11-19 DIAGNOSIS — E871 Hypo-osmolality and hyponatremia: Secondary | ICD-10-CM | POA: Diagnosis not present

## 2019-11-19 DIAGNOSIS — D631 Anemia in chronic kidney disease: Secondary | ICD-10-CM | POA: Diagnosis present

## 2019-11-19 DIAGNOSIS — E872 Acidosis: Secondary | ICD-10-CM | POA: Diagnosis not present

## 2019-11-19 DIAGNOSIS — Z6825 Body mass index (BMI) 25.0-25.9, adult: Secondary | ICD-10-CM

## 2019-11-19 DIAGNOSIS — J9602 Acute respiratory failure with hypercapnia: Secondary | ICD-10-CM | POA: Diagnosis not present

## 2019-11-19 DIAGNOSIS — J40 Bronchitis, not specified as acute or chronic: Secondary | ICD-10-CM | POA: Diagnosis not present

## 2019-11-19 DIAGNOSIS — G9349 Other encephalopathy: Secondary | ICD-10-CM | POA: Diagnosis present

## 2019-11-19 DIAGNOSIS — J9601 Acute respiratory failure with hypoxia: Secondary | ICD-10-CM | POA: Diagnosis not present

## 2019-11-19 DIAGNOSIS — E669 Obesity, unspecified: Secondary | ICD-10-CM | POA: Diagnosis present

## 2019-11-19 DIAGNOSIS — J9600 Acute respiratory failure, unspecified whether with hypoxia or hypercapnia: Secondary | ICD-10-CM

## 2019-11-19 DIAGNOSIS — H510 Palsy (spasm) of conjugate gaze: Secondary | ICD-10-CM | POA: Diagnosis present

## 2019-11-19 HISTORY — DX: Essential (primary) hypertension: I10

## 2019-11-19 HISTORY — DX: Unspecified convulsions: R56.9

## 2019-11-19 HISTORY — DX: Cerebral infarction, unspecified: I63.9

## 2019-11-19 LAB — DIFFERENTIAL
Abs Immature Granulocytes: 0.02 10*3/uL (ref 0.00–0.07)
Basophils Absolute: 0.1 10*3/uL (ref 0.0–0.1)
Basophils Relative: 1 %
Eosinophils Absolute: 0.3 10*3/uL (ref 0.0–0.5)
Eosinophils Relative: 6 %
Immature Granulocytes: 0 %
Lymphocytes Relative: 28 %
Lymphs Abs: 1.6 10*3/uL (ref 0.7–4.0)
Monocytes Absolute: 0.5 10*3/uL (ref 0.1–1.0)
Monocytes Relative: 8 %
Neutro Abs: 3.2 10*3/uL (ref 1.7–7.7)
Neutrophils Relative %: 57 %

## 2019-11-19 LAB — I-STAT CHEM 8, ED
BUN: 16 mg/dL (ref 8–23)
Calcium, Ion: 1.11 mmol/L — ABNORMAL LOW (ref 1.15–1.40)
Chloride: 110 mmol/L (ref 98–111)
Creatinine, Ser: 1.3 mg/dL — ABNORMAL HIGH (ref 0.44–1.00)
Glucose, Bld: 187 mg/dL — ABNORMAL HIGH (ref 70–99)
HCT: 31 % — ABNORMAL LOW (ref 36.0–46.0)
Hemoglobin: 10.5 g/dL — ABNORMAL LOW (ref 12.0–15.0)
Potassium: 3.9 mmol/L (ref 3.5–5.1)
Sodium: 141 mmol/L (ref 135–145)
TCO2: 21 mmol/L — ABNORMAL LOW (ref 22–32)

## 2019-11-19 LAB — APTT: aPTT: 32 seconds (ref 24–36)

## 2019-11-19 LAB — SARS CORONAVIRUS 2 BY RT PCR (HOSPITAL ORDER, PERFORMED IN ~~LOC~~ HOSPITAL LAB): SARS Coronavirus 2: NEGATIVE

## 2019-11-19 LAB — CBC
HCT: 33.5 % — ABNORMAL LOW (ref 36.0–46.0)
Hemoglobin: 10.9 g/dL — ABNORMAL LOW (ref 12.0–15.0)
MCH: 30.5 pg (ref 26.0–34.0)
MCHC: 32.5 g/dL (ref 30.0–36.0)
MCV: 93.8 fL (ref 80.0–100.0)
Platelets: 388 10*3/uL (ref 150–400)
RBC: 3.57 MIL/uL — ABNORMAL LOW (ref 3.87–5.11)
RDW: 13.2 % (ref 11.5–15.5)
WBC: 5.6 10*3/uL (ref 4.0–10.5)
nRBC: 0 % (ref 0.0–0.2)

## 2019-11-19 LAB — COMPREHENSIVE METABOLIC PANEL
ALT: 7 U/L (ref 0–44)
AST: 12 U/L — ABNORMAL LOW (ref 15–41)
Albumin: 3 g/dL — ABNORMAL LOW (ref 3.5–5.0)
Alkaline Phosphatase: 99 U/L (ref 38–126)
Anion gap: 9 (ref 5–15)
BUN: 13 mg/dL (ref 8–23)
CO2: 20 mmol/L — ABNORMAL LOW (ref 22–32)
Calcium: 8.9 mg/dL (ref 8.9–10.3)
Chloride: 110 mmol/L (ref 98–111)
Creatinine, Ser: 1.25 mg/dL — ABNORMAL HIGH (ref 0.44–1.00)
GFR calc Af Amer: 50 mL/min — ABNORMAL LOW (ref >60)
GFR calc non Af Amer: 44 mL/min — ABNORMAL LOW (ref >60)
Glucose, Bld: 188 mg/dL — ABNORMAL HIGH (ref 70–99)
Potassium: 4.2 mmol/L (ref 3.5–5.1)
Sodium: 139 mmol/L (ref 135–145)
Total Bilirubin: 0.6 mg/dL (ref 0.3–1.2)
Total Protein: 6.4 g/dL — ABNORMAL LOW (ref 6.5–8.1)

## 2019-11-19 LAB — CBG MONITORING, ED: Glucose-Capillary: 169 mg/dL — ABNORMAL HIGH (ref 70–99)

## 2019-11-19 LAB — PROTIME-INR
INR: 1 (ref 0.8–1.2)
Prothrombin Time: 13 seconds (ref 11.4–15.2)

## 2019-11-19 LAB — MRSA PCR SCREENING: MRSA by PCR: NEGATIVE

## 2019-11-19 MED ORDER — CLEVIDIPINE BUTYRATE 0.5 MG/ML IV EMUL
0.0000 mg/h | INTRAVENOUS | Status: DC
  Administered 2019-11-19: 6 mg/h via INTRAVENOUS
  Administered 2019-11-19: 1 mg/h via INTRAVENOUS
  Administered 2019-11-20: 10 mg/h via INTRAVENOUS
  Administered 2019-11-20: 11 mg/h via INTRAVENOUS
  Administered 2019-11-20: 2 mg/h via INTRAVENOUS
  Administered 2019-11-20 (×2): 10 mg/h via INTRAVENOUS
  Administered 2019-11-21: 7 mg/h via INTRAVENOUS
  Administered 2019-11-21: 8 mg/h via INTRAVENOUS
  Administered 2019-11-21: 6 mg/h via INTRAVENOUS
  Administered 2019-11-21: 10 mg/h via INTRAVENOUS
  Administered 2019-11-21 – 2019-11-22 (×3): 12 mg/h via INTRAVENOUS
  Administered 2019-11-22: 8 mg/h via INTRAVENOUS
  Administered 2019-11-22: 10 mg/h via INTRAVENOUS
  Administered 2019-11-22: 12 mg/h via INTRAVENOUS
  Administered 2019-11-23: 4 mg/h via INTRAVENOUS
  Filled 2019-11-19 (×19): qty 50

## 2019-11-19 MED ORDER — STROKE: EARLY STAGES OF RECOVERY BOOK
Freq: Once | Status: AC
  Filled 2019-11-19: qty 1

## 2019-11-19 MED ORDER — ACETAMINOPHEN 160 MG/5ML PO SOLN
650.0000 mg | ORAL | Status: DC | PRN
  Administered 2019-11-21 – 2019-12-14 (×34): 650 mg
  Filled 2019-11-19 (×34): qty 20.3

## 2019-11-19 MED ORDER — LEVETIRACETAM IN NACL 500 MG/100ML IV SOLN
500.0000 mg | Freq: Two times a day (BID) | INTRAVENOUS | Status: DC
  Administered 2019-11-19 – 2019-11-23 (×9): 500 mg via INTRAVENOUS
  Filled 2019-11-19 (×10): qty 100

## 2019-11-19 MED ORDER — ACETAMINOPHEN 325 MG PO TABS: 650.0000 mg | ORAL_TABLET | ORAL | Status: DC | PRN

## 2019-11-19 MED ORDER — ACETAMINOPHEN 650 MG RE SUPP
650.0000 mg | RECTAL | Status: DC | PRN
  Administered 2019-11-20: 650 mg via RECTAL
  Filled 2019-11-19: qty 1

## 2019-11-19 MED ORDER — IOHEXOL 350 MG/ML SOLN
75.0000 mL | Freq: Once | INTRAVENOUS | Status: AC | PRN
  Administered 2019-11-19: 75 mL via INTRAVENOUS

## 2019-11-19 MED ORDER — NICARDIPINE HCL IN NACL 20-0.86 MG/200ML-% IV SOLN: 5.0000 mg/h | Freq: Once | INTRAVENOUS | Status: DC

## 2019-11-19 MED ORDER — NIMODIPINE 30 MG PO CAPS: 60.0000 mg | ORAL_CAPSULE | ORAL | Status: DC

## 2019-11-19 MED ORDER — DOCUSATE SODIUM 100 MG PO CAPS: 100.0000 mg | ORAL_CAPSULE | Freq: Two times a day (BID) | ORAL | Status: DC

## 2019-11-19 MED ORDER — CLEVIDIPINE BUTYRATE 0.5 MG/ML IV EMUL: 0.0000 mg/h | INTRAVENOUS | Status: DC

## 2019-11-19 MED ORDER — PANTOPRAZOLE SODIUM 40 MG PO TBEC: 40.0000 mg | DELAYED_RELEASE_TABLET | Freq: Every day | ORAL | Status: DC

## 2019-11-19 MED ORDER — NIMODIPINE 6 MG/ML PO SOLN: 60.0000 mg | ORAL | Status: DC

## 2019-11-19 MED ORDER — SODIUM CHLORIDE 0.9 % IV SOLN: INTRAVENOUS | Status: DC

## 2019-11-19 MED ORDER — ONDANSETRON 4 MG PO TBDP: 4.0000 mg | ORAL_TABLET | Freq: Four times a day (QID) | ORAL | Status: DC | PRN

## 2019-11-19 MED ORDER — SODIUM CHLORIDE 0.9% FLUSH
3.0000 mL | Freq: Once | INTRAVENOUS | Status: AC
Start: 2019-11-19 — End: 2019-11-19
  Administered 2019-11-19: 3 mL via INTRAVENOUS

## 2019-11-19 MED ORDER — PANTOPRAZOLE SODIUM 40 MG PO PACK
40.0000 mg | PACK | Freq: Every day | ORAL | Status: DC
  Administered 2019-11-21 – 2019-12-03 (×13): 40 mg
  Filled 2019-11-19 (×13): qty 20

## 2019-11-19 MED ORDER — ONDANSETRON HCL 4 MG/2ML IJ SOLN
4.0000 mg | Freq: Four times a day (QID) | INTRAMUSCULAR | Status: DC | PRN
  Administered 2019-11-19: 4 mg via INTRAVENOUS
  Filled 2019-11-19: qty 2

## 2019-11-19 NOTE — ED Provider Notes (Signed)
[facility] EMERGENCY DEPARTMENT Provider Note   CSN: [id number] Arrival date & time: 11/19/19  1450  An emergency department physician performed an initial assessment on this suspected stroke patient at 1453.  History Chief Complaint  Patient presents with  . Code Stroke    [place] is a 69 y.o. female with a history of seizure disorder and previous CVA presenting as a code stroke from home.  Last known normal was around noon today, 6/6.  Niece at bedside reports she simply seemed confused and was no longer able to grip on the left.  Quickly developed left-sided facial droop and upper extremity weakness.  EMS called immediately.  Niece reports a recent CVA 5-6 months ago and a previous stroke several years ago without any residual deficits.  Prior to this, lives alone and able to do ADLs by herself.  Granddaughter often comes to her home to help.     Past Medical History:  Diagnosis Date  . CVA (cerebral vascular accident) ([place])   . HTN (hypertension)   . Seizure ([place])     There are no problems to display for this patient.   History reviewed. No pertinent surgical history.   OB History   No obstetric history on file.     No family history on file.  Social History   Tobacco Use  . Smoking status: Not on file  Substance Use Topics  . Alcohol use: Not on file  . Drug use: Not on file    Home Medications Prior to Admission medications   Not on File    Allergies    Patient has no allergy information on record.  Review of Systems   Review of Systems  Unable to perform ROS: Patient unresponsive   Physical Exam Updated Vital Signs BP 125/64   Pulse (!) 103   Temp (!) 96.7 F (35.9 C) (Temporal)   Resp (!) 21   Ht 5\' 6"  (1.676 m)   Wt 77.8 kg   SpO2 94%   BMI 27.68 kg/m   Physical Exam Constitutional:      General: She is not in acute distress. HENT:     Mouth/Throat:     Mouth: Mucous membranes are dry.  Eyes:     Pupils:  Pupils are equal, round, and reactive to light.  Cardiovascular:     Rate and Rhythm: Normal rate and regular rhythm.     Pulses: Normal pulses.  Pulmonary:     Effort: Pulmonary effort is normal.     Breath sounds: Normal breath sounds.  Abdominal:     Palpations: Abdomen is soft.  Musculoskeletal:     Cervical back: Neck supple.  Skin:    General: Skin is warm.     Capillary Refill: Capillary refill takes less than 2 seconds.  Neurological:     Comments: GCS 9: Intermittently opens eyes to voice and moaning, withdrawal specific limb from pain.  Not following commands.  PERRLA.  Eye preference midline/slightly right.  Left-sided facial droop noted.  2+ patellar reflexes bilaterally.    ED Results / Procedures / Treatments   Labs (all labs ordered are listed, but only abnormal results are displayed) Labs Reviewed  CBC - Abnormal; Notable for the following components:      Result Value   RBC 3.57 (*)    Hemoglobin 10.9 (*)    HCT 33.5 (*)    All other components within normal limits  COMPREHENSIVE METABOLIC PANEL - Abnormal; Notable for the  following components:   CO2 20 (*)    Glucose, Bld 188 (*)    Creatinine, Ser 1.25 (*)    Total Protein 6.4 (*)    Albumin 3.0 (*)    AST 12 (*)    GFR calc non Af Amer 44 (*)    GFR calc Af Amer 50 (*)    All other components within normal limits  CBG MONITORING, ED - Abnormal; Notable for the following components:   Glucose-Capillary 169 (*)    All other components within normal limits  I-STAT CHEM 8, ED - Abnormal; Notable for the following components:   Creatinine, Ser 1.30 (*)    Glucose, Bld 187 (*)    Calcium, Ion 1.11 (*)    TCO2 21 (*)    Hemoglobin 10.5 (*)    HCT 31.0 (*)    All other components within normal limits  SARS CORONAVIRUS 2 BY RT PCR (HOSPITAL ORDER, [facility])  PROTIME-INR  APTT  DIFFERENTIAL  CBG MONITORING, ED    EKG None  Radiology CT Code Stroke CTA Head W/WO  contrast  Addendum Date: 11/19/2019   ADDENDUM REPORT: 11/19/2019 15:48 ADDENDUM: These results were communicated to Dr. Cheral Marker At 3:48 pmon 6/6/2021by text page via the [facility] messaging system. Electronically Signed   By: Kellie Simmering DO   On: 11/19/2019 15:48   Result Date: 11/19/2019 CLINICAL DATA:  Neuro deficit, acute, stroke suspected. EXAM: CT ANGIOGRAPHY HEAD AND NECK TECHNIQUE: Multidetector CT imaging of the head and neck was performed using the standard protocol during bolus administration of intravenous contrast. Multiplanar CT image reconstructions and MIPs were obtained to evaluate the vascular anatomy. Carotid stenosis measurements (when applicable) are obtained utilizing NASCET criteria, using the distal internal carotid diameter as the denominator. CONTRAST:  Administered contrast not known at this time. COMPARISON:  Report from MRA head 10/13/2018 (images unavailable). FINDINGS: CTA NECK FINDINGS Aortic arch: Standard aortic branching. Atherosclerotic plaque within the visualized aortic arch and proximal major branch vessels of the neck. No hemodynamically significant innominate or proximal subclavian artery stenosis. Right carotid system: CCA and ICA patent within the neck without significant stenosis (50% or greater). Mild mixed plaque within the carotid bifurcation. Left carotid system: CCA and ICA patent within the neck without significant stenosis (50% or greater). Moderate mixed plaque within the proximal ICA. Vertebral arteries: The vertebral arteries are patent within the neck without significant stenosis. Skeleton: No acute bony abnormality or aggressive osseous lesion. Cervical spondylosis without high-grade bony spinal canal narrowing. Other neck: Enlarged multinodular thyroid gland with extension into the upper mediastinum on the right. There also multifocal parenchymal calcifications. Nodules measure up to 4.4 cm. Upper chest: No consolidation within the imaged lung apices Review of  the MIP images confirms the above findings CTA HEAD FINDINGS Anterior circulation: The intracranial internal carotid arteries are patent. Calcified plaque results in mild narrowing of the cavernous right ICA. The M1 middle cerebral arteries are patent without significant stenosis. No M2 proximal branch occlusion is identified. High-grade focal stenosis within an inferior division mid M2 left MCA branch vessel (series 15, image 30). This may be atherosclerotic in etiology or may reflect basal spasm. There is a 3 x 2 mm aneurysm arising from the left MCA bifurcation (series 12, image 104). By report, this has increased in size since MRA 10/13/2018 (previously 2 x 2 mm). No other intracranial aneurysm is identified. The anterior cerebral arteries are patent without significant proximal stenosis. Posterior circulation: The intracranial  vertebral arteries are patent without significant stenosis, as is the basilar artery. The bilateral posterior cerebral arteries are patent without significant proximal stenosis. Venous sinuses: Within limitations of contrast timing, no convincing thrombus. Anatomic variants: Posterior communicating arteries are hypoplastic or absent bilaterally. Review of the MIP images confirms the above findings IMPRESSION: CTA neck: 1. The bilateral common and internal carotid arteries are patent within the neck without significant stenosis (50% or greater). Mild mixed plaque within the right carotid bifurcation. Moderate mixed plaque within the proximal left ICA. 2. The vertebral arteries are patent within the neck without significant stenosis. 3. Enlarged multinodular thyroid gland with multiple nodules measuring up to 4.4 cm. Nonemergent thyroid ultrasound is recommended for further evaluation. CTA head: 1. 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 By report, this has increased in size since prior MRA head 10/13/2018 (measured at 2 x 2 mm at that time). This is the likely culprit for the large volume  acute subarachnoid hemorrhage. 2. No intracranial large vessel occlusion. 3. High-grade focal stenosis within an inferior division mid M2 left MCA branch vessel. This may reflect atherosclerotic disease or vasospasm. 4. Mild atherosclerotic narrowing of the cavernous right ICA. Electronically Signed: By: Kellie Simmering DO On: 11/19/2019 15:37   CT Code Stroke CTA Neck W/WO contrast  Addendum Date: 11/19/2019   ADDENDUM REPORT: 11/19/2019 15:48 ADDENDUM: These results were communicated to Dr. Cheral Marker At 3:48 pmon 6/6/2021by text page via the [facility] messaging system. Electronically Signed   By: Kellie Simmering DO   On: 11/19/2019 15:48   Result Date: 11/19/2019 CLINICAL DATA:  Neuro deficit, acute, stroke suspected. EXAM: CT ANGIOGRAPHY HEAD AND NECK TECHNIQUE: Multidetector CT imaging of the head and neck was performed using the standard protocol during bolus administration of intravenous contrast. Multiplanar CT image reconstructions and MIPs were obtained to evaluate the vascular anatomy. Carotid stenosis measurements (when applicable) are obtained utilizing NASCET criteria, using the distal internal carotid diameter as the denominator. CONTRAST:  Administered contrast not known at this time. COMPARISON:  Report from MRA head 10/13/2018 (images unavailable). FINDINGS: CTA NECK FINDINGS Aortic arch: Standard aortic branching. Atherosclerotic plaque within the visualized aortic arch and proximal major branch vessels of the neck. No hemodynamically significant innominate or proximal subclavian artery stenosis. Right carotid system: CCA and ICA patent within the neck without significant stenosis (50% or greater). Mild mixed plaque within the carotid bifurcation. Left carotid system: CCA and ICA patent within the neck without significant stenosis (50% or greater). Moderate mixed plaque within the proximal ICA. Vertebral arteries: The vertebral arteries are patent within the neck without significant stenosis. Skeleton: No  acute bony abnormality or aggressive osseous lesion. Cervical spondylosis without high-grade bony spinal canal narrowing. Other neck: Enlarged multinodular thyroid gland with extension into the upper mediastinum on the right. There also multifocal parenchymal calcifications. Nodules measure up to 4.4 cm. Upper chest: No consolidation within the imaged lung apices Review of the MIP images confirms the above findings CTA HEAD FINDINGS Anterior circulation: The intracranial internal carotid arteries are patent. Calcified plaque results in mild narrowing of the cavernous right ICA. The M1 middle cerebral arteries are patent without significant stenosis. No M2 proximal branch occlusion is identified. High-grade focal stenosis within an inferior division mid M2 left MCA branch vessel (series 15, image 30). This may be atherosclerotic in etiology or may reflect basal spasm. There is a 3 x 2 mm aneurysm arising from the left MCA bifurcation (series 12, image 104). By report, this has  increased in size since MRA 10/13/2018 (previously 2 x 2 mm). No other intracranial aneurysm is identified. The anterior cerebral arteries are patent without significant proximal stenosis. Posterior circulation: The intracranial vertebral arteries are patent without significant stenosis, as is the basilar artery. The bilateral posterior cerebral arteries are patent without significant proximal stenosis. Venous sinuses: Within limitations of contrast timing, no convincing thrombus. Anatomic variants: Posterior communicating arteries are hypoplastic or absent bilaterally. Review of the MIP images confirms the above findings IMPRESSION: CTA neck: 1. The bilateral common and internal carotid arteries are patent within the neck without significant stenosis (50% or greater). Mild mixed plaque within the right carotid bifurcation. Moderate mixed plaque within the proximal left ICA. 2. The vertebral arteries are patent within the neck without  significant stenosis. 3. Enlarged multinodular thyroid gland with multiple nodules measuring up to 4.4 cm. Nonemergent thyroid ultrasound is recommended for further evaluation. CTA head: 1. 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 By report, this has increased in size since prior MRA head 10/13/2018 (measured at 2 x 2 mm at that time). This is the likely culprit for the large volume acute subarachnoid hemorrhage. 2. No intracranial large vessel occlusion. 3. High-grade focal stenosis within an inferior division mid M2 left MCA branch vessel. This may reflect atherosclerotic disease or vasospasm. 4. Mild atherosclerotic narrowing of the cavernous right ICA. Electronically Signed: By: Kellie Simmering DO On: 11/19/2019 15:37   CT HEAD CODE STROKE WO CONTRAST  Result Date: 11/19/2019 CLINICAL DATA:  Code stroke. Possible stroke, neuro deficit, acute, stroke suspected. Left facial droop, weakness. EXAM: CT HEAD WITHOUT CONTRAST TECHNIQUE: Contiguous axial images were obtained from the base of the skull through the vertex without intravenous contrast. COMPARISON:  Report from MRA head 10/13/2018 (images currently unavailable), head CT 10/13/2018. FINDINGS: Brain: There is large volume acute subarachnoid hemorrhage overlying the left cerebral hemisphere, within the left sylvian fissure, within the left MCA cistern, within the interhemispheric fissure and within the basal cisterns. Subarachnoid or subdural hemorrhage is also present along the tentorium bilaterally. There is associated mass effect with 3 mm rightward midline shift. No hydrocephalus. 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 Redemonstrated chronic lacunar infarct within the right basal ganglia/internal capsule. Lacunar infarcts within the left basal ganglia/internal capsule are new from prior examination 10/13/2018 but are favored chronic. Background mild ill-defined  hypoattenuation within the cerebral white matter which is nonspecific, but consistent with chronic small vessel ischemic disease. No evidence of intracranial mass. Vascular: No hyperdense vessel. 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 Skull: Normal. Negative for fracture or focal lesion. Sinuses/Orbits: Visualized orbits show no acute finding. Mild ethmoid and maxillary sinus mucosal thickening. No significant mastoid effusion These results were called by telephone at the time of interpretation on 11/19/2019 at 3:13 pm to provider Dr. Cheral Marker, Who verbally acknowledged these results. IMPRESSION: Large volume acute subarachnoid hemorrhage overlying the left cerebral hemisphere, within the left sylvian fissure, within the left MCA cistern, within the interhemispheric fissure and within the basal cisterns. Subarachnoid or subdural hemorrhage is also present along the tentorium bilaterally. 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 Mass effect with 3 mm rightward midline shift.  No hydrocephalus. 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 Lacunar infarcts within the left basal ganglia/internal capsule are new since prior head  CT 10/13/2018, but appear chronic. Otherwise unchanged chronic small vessel ischemic disease. Electronically Signed   By: Kellie Simmering DO   On: 11/19/2019 15:14    Procedures Procedures (including critical care time)  Medications Ordered in ED Medications  clevidipine (CLEVIPREX) infusion 0.5 mg/mL (8 mg/hr Intravenous Rate/Dose Change 11/19/19 1613)  sodium chloride flush (NS) 0.9 % injection 3 mL (3 mLs Intravenous Given 11/19/19 1543)  iohexol (OMNIPAQUE) 350 MG/ML injection 75 mL (75 mLs Intravenous Contrast Given 11/19/19 1527)    ED Course  I have reviewed the triage vital signs and the nursing notes.  Pertinent labs &  imaging results that were available during my care of the patient were reviewed by me and considered in my medical decision making (see chart for details).    MDM Rules/Calculators/A&P                     69 year old female, with medical history significant for previous CVA, seizure disorder, HTN, and T2DM, who presented as a code stroke due to sudden onset right-sided extremity weakness with left-sided facial droop.  CT head showing large subarachnoid hemorrhage with 3 mm rightward midline shift.  CTA with evidence of left MCA aneurysm which is the presumed etiology of SAH.  Hemoglobin 10.9 with mild AKI.  Already evaluated by neurology and consulted neurosurgery i.e. aneurysm clipping/coiling.  Hemodynamically stable and protecting her airway, started on Cleviprex infusion for hypertensive management to maintain BP <140.   Dr. Zada Finders evaluated and will admit to neurosurgery service for catheter angiogram including clipping vs coiling tomorrow a.m.  Final Clinical Impression(s) / ED Diagnoses Final diagnoses:  SAH (subarachnoid hemorrhage) ([place])    Rx / DC Orders ED Discharge Orders    None      Patriciaann Clan, DO  Family Medicine PGY-2    Patriciaann Clan, DO 11/19/19 1707    Drenda Freeze, MD 11/19/19 Einar Crow

## 2019-11-19 NOTE — Consult Note (Addendum)
NEURO HOSPITALIST  CONSULT   Requesting Physician: Dr. Darl Householder    Chief Complaint: Right sided weakness and facial droop  History obtained from:  EMS and Chart Review  HPI:                                                                                                                                         Vaishnavi A Oyervides is a 69 y.o. female with a PMHx of CVA, HTN and seizures who presented to the [facility] ED as a code stroke for sudden onset of right sided weakness and facial droop  Per EMS, the patient had a stroke years ago, but did not have any residual deficits.  She had complained of weakness since Saturday morning. At noon today she slumped over to the right and was flaccid. No seizure like activity was seen. The patient does have a history of seizures. BP 162/93. CBG 188.  CT head: Large volume acute subarachnoid hemorrhage overlying the left cerebral hemisphere, within the left sylvian fissure, within the left MCA cistern, within the interhemispheric fissure and within the basal cisterns. Subarachnoid or subdural hemorrhage is also present along the tentorium bilaterally. 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 Mass effect with 3 mm rightward midline shift.  No hydrocephalus. 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 Lacunar infarcts within the left basal ganglia/internal capsule are new since prior head CT 10/13/2018, but appear chronic.  CTA neck: 1. The bilateral common and internal carotid arteries are patent within the neck without significant stenosis (50% or greater). Mild mixed plaque within the right carotid bifurcation. Moderate mixed plaque within the proximal left ICA. 2. The vertebral arteries are patent within the neck without significant stenosis. 3. Enlarged multinodular thyroid gland with multiple nodules measuring up to 4.4 cm.  Nonemergent thyroid ultrasound is recommended for further evaluation.  CTA head: 1. 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 By report, this has increased in size since prior MRA head 10/13/2018 (measured at 2 x 2 mm at that time). This is the likely culprit for the large volume acute subarachnoid hemorrhage. 2. No intracranial large vessel occlusion. 3. High-grade focal stenosis within an inferior division mid M2 left MCA branch vessel. This may reflect atherosclerotic disease or vasospasm. 4. Mild atherosclerotic narrowing of the cavernous right ICA.   Date last known well: 11/19/19 Time last known well: 1200     Modified Rankin: Rankin Score=1 NIHSS:25 Level Of Consciousness 0=Alert; keenly responsive 1=Not alert, but arousable by minor stimulation 2=Not alert, requires repeated stimulation 3=Responds only with reflex  movements 2  LOC Questions to Month and Age 59=Answers both questions correctly 1=Answers one question correctly 2=Answers neither question correctly 2  LOC Commands      -Open/Close eyes     -Open/close grip 0=Performs both tasks correctly 1=Performs one task correctly 2=Performs neighter task correctly 2  Best Gaze 0=Normal 1=Partial gaze palsy 2=Forced deviation, or total gaze paresis 1  Visual 0=No visual loss 1=Partial hemianopia 2=Complete hemianopia 3=Bilateral hemianopia (blind including cortical blindness) 0  Facial Palsy 0=Normal symmetrical movement 1=Minor paralysis (asymmetry) 2=Partial paralysis (lower face) 3=Complete paralysis (upper and lower face) 1  Motor   0=No drift, limb holds posture for full 10 seconds 1=Drift, limb holds posture, no drift to bed 2=Some antigravity effort, cannot maintain posture, drifts to bed 3=No effort against gravity, limb falls 4=No movement Right Arm 3     Leg 3    Left Arm 3     Leg 3  Limb Ataxia 0=Absent 1=Present in one limb 2=Present in two limbs 0  Sensory 0=Normal 1=Mild to moderate sensory  loss 2=Severe to total sensory loss 0  Best Language 0=No aphasia, normal 1=Mild to moderate aphasia 2=Mute, global aphasia 3=Mute, global aphasia 3  Dysarthria 0=Normal 1=Mild to moderate 2=Severe, unintelligible or mute/anarthric 2  Extinction/Neglect 0=No abnormality 1=Extinction to bilateral simultaneous stimulation 2=Profound neglect 0  Total     25       Past Medical History:  Diagnosis Date  . CVA (cerebral vascular accident) ([place])   . HTN (hypertension)   . Seizure [facility])     History reviewed. No pertinent surgical history.  No family history on file.       Social History:  has no history on file for tobacco, alcohol, and drug.  Allergies: Not on File  Medications:                                                                                                                           Current Facility-Administered Medications  Medication Dose Route Frequency Provider Last Rate Last Admin  . sodium chloride flush (NS) 0.9 % injection 3 mL  3 mL Intravenous Once Curatolo, Adam, DO       No current outpatient medications on file.   Home medications are as follows:  Amlodipine 10 mg QD loratidine 10 mg Qd PRN Hydralazine 50 mg TID linzest 145 mcg daily Keppra 500 BID meloxicam 7.5mg   PRN pain q day ASA 81 mg Omeprazole 20 mg  ROS:  History  unobtainable from patient due to mental status   General Examination:                                                                                                      There were no vitals taken for this visit.  Physical Exam  Constitutional: Appears well-developed and well-nourished.  Eyes: Normal external eye and conjunctiva. HENT: Normocephalic, no lesions, without obvious abnormality.  adentulous Musculoskeletal-no joint tenderness, deformity or swelling Cardiovascular: Normal  rate and regular rhythm.  Respiratory: Effort normal, non-labored breathing saturations WNL GI: Soft.  No distension. There is no tenderness.  Skin: WDI  Neurological Examination Mental Status: Patient is lethargic, but arouses briefly to noxious stimuli. Does not follow commands. Cranial Nerves: Initially patient with gaze preference to the right side, eyes do cross midline to the left when in an asleep state and roving EOM are noted. Left facial droop Motor/sensory: Able to withdraw all 4 extremities to noxious stimuli Deep Tendon Reflexes: 2+ and symmetric biceps and patellae Plantars: Right: downgoing   Left: downgoing Cerebellar: Unable to assess Gait: Unable to assess   Lab Results: Basic Metabolic Panel: Recent Labs  Lab 11/19/19 1507  NA 141  K 3.9  CL 110  GLUCOSE 187*  BUN 16  CREATININE 1.30*    CBC: Recent Labs  Lab 11/19/19 1459 11/19/19 1507  WBC 5.6  --   NEUTROABS 3.2  --   HGB 10.9* 10.5*  HCT 33.5* 31.0*  MCV 93.8  --   PLT 388  --      CBG: Recent Labs  Lab 11/19/19 1454  GLUCAP 169*    Imaging: CT HEAD CODE STROKE WO CONTRAST  Result Date: 11/19/2019 CLINICAL DATA:  Code stroke. Possible stroke, neuro deficit, acute, stroke suspected. Left facial droop, weakness. EXAM: CT HEAD WITHOUT CONTRAST TECHNIQUE: Contiguous axial images were obtained from the base of the skull through the vertex without intravenous contrast. COMPARISON:  Report from MRA head 10/13/2018 (images currently unavailable), head CT 10/13/2018. FINDINGS: Brain: There is large volume acute subarachnoid hemorrhage overlying the left cerebral hemisphere, within the left sylvian fissure, within the left MCA cistern, within the interhemispheric fissure and within the basal cisterns. Subarachnoid or subdural hemorrhage is also present along the tentorium bilaterally. There is associated mass effect with 3 mm rightward midline shift. No hydrocephalus. Redemonstrated chronic  cortical/subcortical posterior right MCA territory infarct affecting the right parietal and occipital lobes. Redemonstrated chronic lacunar infarct within the right basal ganglia/internal capsule. Lacunar infarcts within the left basal ganglia/internal capsule are new from prior examination 10/13/2018 but are favored chronic. Background mild ill-defined hypoattenuation within the cerebral white matter which is nonspecific, but consistent with chronic small vessel ischemic disease. No evidence of intracranial mass. Vascular: No hyperdense vessel. 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 Skull: Normal. Negative for fracture or focal lesion. Sinuses/Orbits: Visualized orbits show no acute finding. Mild ethmoid and maxillary sinus mucosal thickening. No significant mastoid effusion These results were called by telephone at  the time of interpretation on 11/19/2019 at 3:13 pm to provider Dr. Cheral Marker, Who verbally acknowledged these results. IMPRESSION: Large volume acute subarachnoid hemorrhage overlying the left cerebral hemisphere, within the left sylvian fissure, within the left MCA cistern, within the interhemispheric fissure and within the basal cisterns. Subarachnoid or subdural hemorrhage is also present along the tentorium bilaterally. 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 Mass effect with 3 mm rightward midline shift.  No hydrocephalus. 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 Lacunar infarcts within the left basal ganglia/internal capsule are new since prior head CT 10/13/2018, but appear chronic. Otherwise unchanged chronic small vessel ischemic disease. Electronically Signed   By: Kellie Simmering DO   On: 11/19/2019 15:14   Laurey Morale, MSN, NP-C [facility] [phone] 11/19/2019, 3:00 PM   Assessment: 69 y.o. female with a  PMHx of right MCA CVA, HTN, left MCA aneurysm and seizures who presents to the [facility] ED as a code stroke for sudden onset of right sided weakness and facial droop 1. CT reveals a large SAH, as described in the report above. 2. CTA of head reveals a 3 x 2 mm left MCA bifurcation aneurysm, which is likely the culprit for the [facility].  3. STAT consult called to Neurosurgery.  4. SAH Risk Factors - hypertension and known aneurysm 5. History of seizures. On Keppra 500 mg BID as an outpatient  Recommendations: --Surgical versus endovascular management of left MCA aneurysm  --Surgical and medical management of SAH per Neurosurgery --MRI brain when stable --Continue Keppra at 500 mg BID, administered IV --SBP goal < 140 --Frequent neuro checks   40 minutes spent in the emergent neurological evaluation and management of this critically ill patient  I have seen and examined the patient. I have formulated the assessment and recommendations. My exam findings were observed and documented by Laurey Morale, NP Electronically signed: Dr. Kerney Elbe

## 2019-11-19 NOTE — ED Notes (Signed)
Family at bedside. 

## 2019-11-19 NOTE — Progress Notes (Signed)
Pt unable to pass swallow screen, per Dr. Zada Finders it is ok to hold nimotop tonight.

## 2019-11-19 NOTE — ED Notes (Signed)
ICU/SAH Mardene Celeste granddaughter [phone] looking for an update on pt

## 2019-11-19 NOTE — H&P (Addendum)
Neurosurgery H&P  CC: Altered mental status  HPI: This is a 69 y.o. woman with a reported history of epilepsy that presents with acute change in mental status. Pt unable to provide any further information due to altered mental status, remainder of history obtained from chart review and report from family. She had a stroke 10y ago but reportedly has no residual deficits. She has had weakness for roughly 24h then became unresponsive today. Per report, she has a known L MCA aneurysm that was being monitored. By report, no use of antiplatelet or anticoagulant medications but outpatient medications are not yet listed in EMR.   ROS: A 14 point ROS was performed and is negative except as noted in the HPI.   PMHx:  Past Medical History:  Diagnosis Date   CVA (cerebral vascular accident) ([place])    HTN (hypertension)    Seizure ([place])    FamHx: No family history on file. SocHx:  has no history on file for tobacco, alcohol, and drug.  Exam: Vital signs in last 24 hours: Temp:  [96.7 F (35.9 C)] 96.7 F (35.9 C) (06/06 1528) Pulse Rate:  [90-106] 105 (06/06 1626) Resp:  [15-31] 17 (06/06 1626) BP: (108-185)/(56-127) 108/61 (06/06 1626) SpO2:  [94 %-100 %] 96 % (06/06 1626) Weight:  [77.8 kg] 77.8 kg (06/06 1529) General: Somnolent but eyes open to voice, lying in hospital bed, appears acutely ill Head: Normocephalic and atruamatic HEENT: +neck stiffness Pulmonary: breathing room air comfortably, no evidence of increased work of breathing Cardiac: RRR Abdomen: S NT ND Extremities: Warm and well perfused x4 Neuro: Somnolent, eyes open to voice, PERRL, gaze neutral, face with symmetric nasolabial folds, regards but +motor and receptive aphasia, with cues she'll move both sides but no spontaneous movement  Assessment and Plan: 69 y.o. woman w/ acute onset altered mental status. CTH/CTA personally reviewed, which show diffuse SAH tracking towards left Sylvian fissure. CTA shows 2x35mm L MCA  bifurcation aneurysm.   -admit to 4N, aneurysmal SAH order set -pt is currently Hunt-Hess 3, modified Fisher grade 1 -NPO p MN for catheter angiogram with Dr. Kathyrn Sheriff in AM followed by treatment (clipping vs coiling, depending on angiogram findings) -SBP<140 -Cr 1.25 c/w AKI, IVF overnight and repeat BMP to prep for angiogram  Judith Part, MD 11/19/19 4:40 PM [facility]

## 2019-11-19 NOTE — ED Triage Notes (Signed)
Pt from home with [facility] for CODE Stroke. LSN 1200 today. Pt was with family when she suddenly slumped over and stopped talking. Pt arrives to ED responsive to pain.  BP 136/78 HR92 100% room air CBG 188 18G LAC

## 2019-11-20 ENCOUNTER — Inpatient Hospital Stay (HOSPITAL_COMMUNITY): Payer: Medicare Other

## 2019-11-20 ENCOUNTER — Encounter (HOSPITAL_COMMUNITY)
Admission: EM | Disposition: A | Discharge status: Nursing Home (Includes ICF) | Payer: Self-pay | Source: Home / Self Care | Attending: Neurosurgery

## 2019-11-20 ENCOUNTER — Other Ambulatory Visit (HOSPITAL_COMMUNITY): Payer: Medicare Other

## 2019-11-20 ENCOUNTER — Encounter (HOSPITAL_COMMUNITY): Payer: Self-pay | Admitting: Neurological Surgery

## 2019-11-20 ENCOUNTER — Inpatient Hospital Stay (HOSPITAL_COMMUNITY): Payer: Medicare Other | Admitting: Certified Registered"

## 2019-11-20 DIAGNOSIS — Z20822 Contact with and (suspected) exposure to covid-19: Secondary | ICD-10-CM | POA: Diagnosis not present

## 2019-11-20 DIAGNOSIS — J9621 Acute and chronic respiratory failure with hypoxia: Secondary | ICD-10-CM | POA: Diagnosis not present

## 2019-11-20 DIAGNOSIS — I609 Nontraumatic subarachnoid hemorrhage, unspecified: Secondary | ICD-10-CM | POA: Diagnosis not present

## 2019-11-20 DIAGNOSIS — I6012 Nontraumatic subarachnoid hemorrhage from left middle cerebral artery: Secondary | ICD-10-CM | POA: Diagnosis not present

## 2019-11-20 DIAGNOSIS — I6200 Nontraumatic subdural hemorrhage, unspecified: Secondary | ICD-10-CM | POA: Diagnosis not present

## 2019-11-20 HISTORY — PX: IR ANGIO INTRA EXTRACRAN SEL INTERNAL CAROTID BILAT MOD SED: IMG5363

## 2019-11-20 HISTORY — PX: CRANIOTOMY: SHX93

## 2019-11-20 HISTORY — PX: IR 3D INDEPENDENT WKST: IMG2385

## 2019-11-20 HISTORY — PX: IR US GUIDE VASC ACCESS RIGHT: IMG2390

## 2019-11-20 HISTORY — PX: IR ANGIO VERTEBRAL SEL VERTEBRAL UNI L MOD SED: IMG5367

## 2019-11-20 HISTORY — PX: RADIOLOGY WITH ANESTHESIA: SHX6223

## 2019-11-20 LAB — POCT I-STAT, CHEM 8
BUN: 14 mg/dL (ref 8–23)
BUN: 14 mg/dL (ref 8–23)
BUN: 13 mg/dL (ref 8–23)
Calcium, Ion: 1.26 mmol/L (ref 1.15–1.40)
Calcium, Ion: 1.25 mmol/L (ref 1.15–1.40)
Calcium, Ion: 1.25 mmol/L (ref 1.15–1.40)
Chloride: 110 mmol/L (ref 98–111)
Chloride: 108 mmol/L (ref 98–111)
Chloride: 111 mmol/L (ref 98–111)
Creatinine, Ser: 1.1 mg/dL — ABNORMAL HIGH (ref 0.44–1.00)
Creatinine, Ser: 1.1 mg/dL — ABNORMAL HIGH (ref 0.44–1.00)
Creatinine, Ser: 1.3 mg/dL — ABNORMAL HIGH (ref 0.44–1.00)
Glucose, Bld: 206 mg/dL — ABNORMAL HIGH (ref 70–99)
Glucose, Bld: 120 mg/dL — ABNORMAL HIGH (ref 70–99)
Glucose, Bld: 85 mg/dL (ref 70–99)
HCT: 27 % — ABNORMAL LOW (ref 36.0–46.0)
HCT: 23 % — ABNORMAL LOW (ref 36.0–46.0)
HCT: 25 % — ABNORMAL LOW (ref 36.0–46.0)
Hemoglobin: 9.2 g/dL — ABNORMAL LOW (ref 12.0–15.0)
Hemoglobin: 7.8 g/dL — ABNORMAL LOW (ref 12.0–15.0)
Hemoglobin: 8.5 g/dL — ABNORMAL LOW (ref 12.0–15.0)
Potassium: 4.1 mmol/L (ref 3.5–5.1)
Potassium: 3.6 mmol/L (ref 3.5–5.1)
Potassium: 3.9 mmol/L (ref 3.5–5.1)
Sodium: 145 mmol/L (ref 135–145)
Sodium: 142 mmol/L (ref 135–145)
Sodium: 142 mmol/L (ref 135–145)
TCO2: 19 mmol/L — ABNORMAL LOW (ref 22–32)
TCO2: 18 mmol/L — ABNORMAL LOW (ref 22–32)
TCO2: 19 mmol/L — ABNORMAL LOW (ref 22–32)

## 2019-11-20 LAB — GLUCOSE, CAPILLARY: Glucose-Capillary: 120 mg/dL — ABNORMAL HIGH (ref 70–99)

## 2019-11-20 LAB — BASIC METABOLIC PANEL
Anion gap: 9 (ref 5–15)
BUN: 14 mg/dL (ref 8–23)
CO2: 20 mmol/L — ABNORMAL LOW (ref 22–32)
Calcium: 9 mg/dL (ref 8.9–10.3)
Chloride: 111 mmol/L (ref 98–111)
Creatinine, Ser: 1.23 mg/dL — ABNORMAL HIGH (ref 0.44–1.00)
GFR calc Af Amer: 51 mL/min — ABNORMAL LOW (ref >60)
GFR calc non Af Amer: 44 mL/min — ABNORMAL LOW (ref >60)
Glucose, Bld: 255 mg/dL — ABNORMAL HIGH (ref 70–99)
Potassium: 4.2 mmol/L (ref 3.5–5.1)
Sodium: 140 mmol/L (ref 135–145)

## 2019-11-20 LAB — POCT I-STAT 7, (LYTES, BLD GAS, ICA,H+H)
Acid-base deficit: 3 mmol/L — ABNORMAL HIGH (ref 0.0–2.0)
Acid-base deficit: 4 mmol/L — ABNORMAL HIGH (ref 0.0–2.0)
Bicarbonate: 23.3 mmol/L (ref 20.0–28.0)
Bicarbonate: 21.6 mmol/L (ref 20.0–28.0)
Calcium, Ion: 1.31 mmol/L (ref 1.15–1.40)
Calcium, Ion: 1.27 mmol/L (ref 1.15–1.40)
HCT: 30 % — ABNORMAL LOW (ref 36.0–46.0)
HCT: 26 % — ABNORMAL LOW (ref 36.0–46.0)
Hemoglobin: 10.2 g/dL — ABNORMAL LOW (ref 12.0–15.0)
Hemoglobin: 8.8 g/dL — ABNORMAL LOW (ref 12.0–15.0)
O2 Saturation: 99 %
O2 Saturation: 99 %
Patient temperature: 99.3
Patient temperature: 36.8
Potassium: 4.2 mmol/L (ref 3.5–5.1)
Potassium: 4.1 mmol/L (ref 3.5–5.1)
Sodium: 143 mmol/L (ref 135–145)
Sodium: 144 mmol/L (ref 135–145)
TCO2: 25 mmol/L (ref 22–32)
TCO2: 23 mmol/L (ref 22–32)
pCO2 arterial: 46.5 mmHg (ref 32.0–48.0)
pCO2 arterial: 38.8 mmHg (ref 32.0–48.0)
pH, Arterial: 7.309 — ABNORMAL LOW (ref 7.350–7.450)
pH, Arterial: 7.352 (ref 7.350–7.450)
pO2, Arterial: 155 mmHg — ABNORMAL HIGH (ref 83.0–108.0)
pO2, Arterial: 164 mmHg — ABNORMAL HIGH (ref 83.0–108.0)

## 2019-11-20 LAB — PREPARE RBC (CROSSMATCH)

## 2019-11-20 LAB — ABO/RH: ABO/RH(D): O POS

## 2019-11-20 LAB — HIV ANTIBODY (ROUTINE TESTING W REFLEX): HIV Screen 4th Generation wRfx: NONREACTIVE

## 2019-11-20 SURGERY — CRANIOTOMY, FOR VERTEBRAL/BASILAR ARTERY ANEURYSM REPAIR
Anesthesia: General | Laterality: Left

## 2019-11-20 SURGERY — IR WITH ANESTHESIA
Anesthesia: General

## 2019-11-20 MED ORDER — PROPOFOL 500 MG/50ML IV EMUL
INTRAVENOUS | Status: DC | PRN
Start: 2019-11-20 — End: 2019-11-20
  Administered 2019-11-20: 30 mg via INTRAVENOUS

## 2019-11-20 MED ORDER — 0.9 % SODIUM CHLORIDE (POUR BTL) OPTIME
TOPICAL | Status: DC | PRN
  Administered 2019-11-20: 3000 mL

## 2019-11-20 MED ORDER — THROMBIN 5000 UNITS EX SOLR: OROMUCOSAL | Status: DC | PRN

## 2019-11-20 MED ORDER — DOCUSATE SODIUM 50 MG/5ML PO LIQD: 100.0000 mg | Freq: Two times a day (BID) | ORAL | Status: DC

## 2019-11-20 MED ORDER — BUPIVACAINE HCL 0.5 % IJ SOLN
INTRAMUSCULAR | Status: DC | PRN
  Administered 2019-11-20: 6.5 mL

## 2019-11-20 MED ORDER — POLYETHYLENE GLYCOL 3350 17 G PO PACK: 17.0000 g | PACK | Freq: Every day | ORAL | Status: DC | PRN

## 2019-11-20 MED ORDER — THROMBIN 20000 UNITS EX SOLR
CUTANEOUS | Status: AC
  Filled 2019-11-20: qty 20000

## 2019-11-20 MED ORDER — MIDAZOLAM HCL 2 MG/2ML IJ SOLN
INTRAMUSCULAR | Status: AC
  Filled 2019-11-20: qty 2

## 2019-11-20 MED ORDER — NIMODIPINE 6 MG/ML PO SOLN
60.0000 mg | ORAL | Status: AC
  Administered 2019-11-20 – 2019-12-10 (×115): 60 mg
  Filled 2019-11-20 (×112): qty 10

## 2019-11-20 MED ORDER — CEFAZOLIN SODIUM-DEXTROSE 1-4 GM/50ML-% IV SOLN: 1.0000 g | Freq: Three times a day (TID) | INTRAVENOUS | Status: DC

## 2019-11-20 MED ORDER — PROMETHAZINE HCL 25 MG PO TABS: 12.5000 mg | ORAL_TABLET | ORAL | Status: DC | PRN

## 2019-11-20 MED ORDER — MIDAZOLAM HCL 2 MG/2ML IJ SOLN
1.0000 mg | Freq: Once | INTRAMUSCULAR | Status: AC
  Administered 2019-11-20: 1 mg via INTRAVENOUS

## 2019-11-20 MED ORDER — ACETAMINOPHEN 650 MG RE SUPP: 650.0000 mg | RECTAL | Status: DC | PRN

## 2019-11-20 MED ORDER — DEXAMETHASONE SODIUM PHOSPHATE 10 MG/ML IJ SOLN
INTRAMUSCULAR | Status: DC | PRN
Start: 2019-11-20 — End: 2019-11-20
  Administered 2019-11-20: 10 mg via INTRAVENOUS
  Administered 2019-11-20: 5 mg via INTRAVENOUS

## 2019-11-20 MED ORDER — THROMBIN 5000 UNITS EX SOLR
CUTANEOUS | Status: AC
  Filled 2019-11-20: qty 5000

## 2019-11-20 MED ORDER — ACETAMINOPHEN 325 MG PO TABS: 650.0000 mg | ORAL_TABLET | ORAL | Status: DC | PRN

## 2019-11-20 MED ORDER — FENTANYL CITRATE (PF) 100 MCG/2ML IJ SOLN
INTRAMUSCULAR | Status: AC
  Filled 2019-11-20: qty 2

## 2019-11-20 MED ORDER — FENTANYL CITRATE (PF) 100 MCG/2ML IJ SOLN: 100.0000 ug | Freq: Once | INTRAMUSCULAR | Status: AC

## 2019-11-20 MED ORDER — INDOCYANINE GREEN 25 MG IV SOLR
INTRAVENOUS | Status: DC | PRN
Start: 2019-11-20 — End: 2019-11-20
  Administered 2019-11-20: 12.5 mg via INTRAVENOUS

## 2019-11-20 MED ORDER — ORAL CARE MOUTH RINSE
15.0000 mL | OROMUCOSAL | Status: DC
  Administered 2019-11-20 – 2019-12-15 (×240): 15 mL via OROMUCOSAL

## 2019-11-20 MED ORDER — DEXAMETHASONE SODIUM PHOSPHATE 10 MG/ML IJ SOLN
INTRAMUSCULAR | Status: AC
  Filled 2019-11-20: qty 1

## 2019-11-20 MED ORDER — IOHEXOL 300 MG/ML  SOLN
150.0000 mL | Freq: Once | INTRAMUSCULAR | Status: AC | PRN
  Administered 2019-11-20: 50 mL via INTRA_ARTERIAL

## 2019-11-20 MED ORDER — SODIUM CHLORIDE 0.9 % IV SOLN
1.0000 g | INTRAVENOUS | Status: DC
  Administered 2019-11-20 – 2019-11-22 (×3): 1 g via INTRAVENOUS
  Filled 2019-11-20: qty 1
  Filled 2019-11-20: qty 10
  Filled 2019-11-20: qty 1
  Filled 2019-11-20: qty 10
  Filled 2019-11-20: qty 1

## 2019-11-20 MED ORDER — SODIUM CHLORIDE 0.9 % IV SOLN: INTRAVENOUS | Status: DC | PRN

## 2019-11-20 MED ORDER — MANNITOL 20 % IV SOLN: INTRAVENOUS | Status: DC | PRN

## 2019-11-20 MED ORDER — IOHEXOL 300 MG/ML  SOLN
100.0000 mL | Freq: Once | INTRAMUSCULAR | Status: AC | PRN
  Administered 2019-11-20: 25 mL via INTRA_ARTERIAL

## 2019-11-20 MED ORDER — CHLORHEXIDINE GLUCONATE 0.12% ORAL RINSE (MEDLINE KIT)
15.0000 mL | Freq: Two times a day (BID) | OROMUCOSAL | Status: DC
  Administered 2019-11-20 – 2019-12-15 (×51): 15 mL via OROMUCOSAL

## 2019-11-20 MED ORDER — PANTOPRAZOLE SODIUM 40 MG IV SOLR: 40.0000 mg | Freq: Every day | INTRAVENOUS | Status: DC

## 2019-11-20 MED ORDER — DOCUSATE SODIUM 50 MG/5ML PO LIQD
100.0000 mg | Freq: Two times a day (BID) | ORAL | Status: DC
  Administered 2019-11-20 – 2019-12-05 (×12): 100 mg
  Filled 2019-11-20 (×14): qty 10

## 2019-11-20 MED ORDER — ONDANSETRON HCL 4 MG/2ML IJ SOLN
4.0000 mg | INTRAMUSCULAR | Status: DC | PRN
  Administered 2019-12-05 – 2019-12-25 (×4): 4 mg via INTRAVENOUS
  Filled 2019-11-20 (×4): qty 2

## 2019-11-20 MED ORDER — HEMOSTATIC AGENTS (NO CHARGE) OPTIME
TOPICAL | Status: DC | PRN
  Administered 2019-11-20: 1 via TOPICAL

## 2019-11-20 MED ORDER — MANNITOL 20 % IV SOLN
INTRAVENOUS | Status: AC
  Filled 2019-11-20: qty 500

## 2019-11-20 MED ORDER — SUFENTANIL CITRATE 50 MCG/ML IV SOLN
INTRAVENOUS | Status: DC | PRN
  Administered 2019-11-20: 5 ug via INTRAVENOUS
  Administered 2019-11-20: 10 ug via INTRAVENOUS

## 2019-11-20 MED ORDER — ROCURONIUM BROMIDE 50 MG/5ML IV SOLN
50.0000 mg | Freq: Once | INTRAVENOUS | Status: AC
  Filled 2019-11-20: qty 5

## 2019-11-20 MED ORDER — FLEET ENEMA 7-19 GM/118ML RE ENEM: 1.0000 | ENEMA | Freq: Once | RECTAL | Status: DC | PRN

## 2019-11-20 MED ORDER — SODIUM CHLORIDE 0.9 % IV SOLN: INTRAVENOUS | Status: DC

## 2019-11-20 MED ORDER — CHLORHEXIDINE GLUCONATE CLOTH 2 % EX PADS
6.0000 | MEDICATED_PAD | Freq: Every day | CUTANEOUS | Status: DC
  Administered 2019-11-21: 6 via TOPICAL

## 2019-11-20 MED ORDER — FENTANYL CITRATE (PF) 250 MCG/5ML IJ SOLN
INTRAMUSCULAR | Status: DC | PRN
  Administered 2019-11-20 (×2): 50 ug via INTRAVENOUS

## 2019-11-20 MED ORDER — SENNA 8.6 MG PO TABS
1.0000 | ORAL_TABLET | Freq: Two times a day (BID) | ORAL | Status: DC
  Administered 2019-11-20 – 2019-12-05 (×12): 8.6 mg
  Filled 2019-11-20 (×13): qty 1

## 2019-11-20 MED ORDER — LIDOCAINE HCL (PF) 1 % IJ SOLN
INTRAMUSCULAR | Status: DC | PRN
  Administered 2019-11-20: 6.5 mL

## 2019-11-20 MED ORDER — BACITRACIN ZINC 500 UNIT/GM EX OINT
TOPICAL_OINTMENT | CUTANEOUS | Status: AC
  Filled 2019-11-20: qty 28.35

## 2019-11-20 MED ORDER — BUPIVACAINE HCL (PF) 0.5 % IJ SOLN
INTRAMUSCULAR | Status: AC
  Filled 2019-11-20: qty 30

## 2019-11-20 MED ORDER — ROCURONIUM BROMIDE 10 MG/ML (PF) SYRINGE
PREFILLED_SYRINGE | INTRAVENOUS | Status: AC
  Administered 2019-11-20: 100 mg
  Filled 2019-11-20: qty 10

## 2019-11-20 MED ORDER — NALOXONE HCL 0.4 MG/ML IJ SOLN
0.0800 mg | INTRAMUSCULAR | Status: DC | PRN
  Filled 2019-11-20: qty 1

## 2019-11-20 MED ORDER — HYDROCODONE-ACETAMINOPHEN 5-325 MG PO TABS
1.0000 | ORAL_TABLET | ORAL | Status: DC | PRN
  Administered 2019-11-22 – 2019-11-30 (×4): 1
  Filled 2019-11-20 (×4): qty 1

## 2019-11-20 MED ORDER — PROPOFOL 1000 MG/100ML IV EMUL
0.0000 ug/kg/min | INTRAVENOUS | Status: DC
  Administered 2019-11-20 – 2019-11-22 (×4): 10 ug/kg/min via INTRAVENOUS
  Filled 2019-11-20 (×5): qty 100

## 2019-11-20 MED ORDER — POLYETHYLENE GLYCOL 3350 17 G PO PACK
17.0000 g | PACK | Freq: Every day | ORAL | Status: DC
  Filled 2019-11-20: qty 1

## 2019-11-20 MED ORDER — HYDROCODONE-ACETAMINOPHEN 5-325 MG PO TABS: 1.0000 | ORAL_TABLET | ORAL | Status: DC | PRN

## 2019-11-20 MED ORDER — ETOMIDATE 2 MG/ML IV SOLN: 20.0000 mg | Freq: Once | INTRAVENOUS | Status: AC

## 2019-11-20 MED ORDER — ROCURONIUM BROMIDE 100 MG/10ML IV SOLN
INTRAVENOUS | Status: DC | PRN
  Administered 2019-11-20: 60 mg via INTRAVENOUS
  Administered 2019-11-20: 40 mg via INTRAVENOUS

## 2019-11-20 MED ORDER — DEXMEDETOMIDINE HCL IN NACL 400 MCG/100ML IV SOLN
0.4000 ug/kg/h | INTRAVENOUS | Status: DC
  Filled 2019-11-20: qty 100

## 2019-11-20 MED ORDER — BACITRACIN ZINC 500 UNIT/GM EX OINT
TOPICAL_OINTMENT | CUTANEOUS | Status: DC | PRN
  Administered 2019-11-20 (×2): 1 via TOPICAL

## 2019-11-20 MED ORDER — LIDOCAINE HCL (PF) 0.5 % IJ SOLN
INTRAMUSCULAR | Status: AC
  Filled 2019-11-20: qty 50

## 2019-11-20 MED ORDER — ETOMIDATE 2 MG/ML IV SOLN
INTRAVENOUS | Status: AC
  Filled 2019-11-20: qty 20

## 2019-11-20 MED ORDER — ONDANSETRON HCL 4 MG PO TABS
4.0000 mg | ORAL_TABLET | ORAL | Status: DC | PRN
  Administered 2019-12-11: 4 mg via ORAL
  Filled 2019-11-20: qty 1

## 2019-11-20 MED ORDER — FENTANYL CITRATE (PF) 100 MCG/2ML IJ SOLN: 25.0000 ug | INTRAMUSCULAR | Status: DC | PRN

## 2019-11-20 MED ORDER — POLYETHYLENE GLYCOL 3350 17 G PO PACK
17.0000 g | PACK | Freq: Every day | ORAL | Status: DC
  Administered 2019-11-21 – 2019-11-30 (×4): 17 g
  Filled 2019-11-20 (×6): qty 1

## 2019-11-20 MED ORDER — ETOMIDATE 2 MG/ML IV SOLN
INTRAVENOUS | Status: AC
  Filled 2019-11-20: qty 10

## 2019-11-20 MED ORDER — LEVETIRACETAM IN NACL 500 MG/100ML IV SOLN: 500.0000 mg | Freq: Two times a day (BID) | INTRAVENOUS | Status: DC

## 2019-11-20 MED ORDER — VITAL HIGH PROTEIN PO LIQD
1000.0000 mL | ORAL | Status: DC
  Administered 2019-11-21 (×2): 1000 mL

## 2019-11-20 MED ORDER — CHLORHEXIDINE GLUCONATE 0.12% ORAL RINSE (MEDLINE KIT)
15.0000 mL | Freq: Two times a day (BID) | OROMUCOSAL | Status: DC
Start: 2019-11-20 — End: 2019-11-20

## 2019-11-20 MED ORDER — HYDROMORPHONE HCL 1 MG/ML IJ SOLN
0.5000 mg | INTRAMUSCULAR | Status: DC | PRN
  Administered 2019-11-22 – 2019-11-25 (×4): 1 mg via INTRAVENOUS
  Filled 2019-11-20 (×4): qty 1

## 2019-11-20 MED ORDER — ORAL CARE MOUTH RINSE
15.0000 mL | OROMUCOSAL | Status: DC
Start: 2019-11-20 — End: 2019-11-20

## 2019-11-20 MED ORDER — LABETALOL HCL 5 MG/ML IV SOLN: 10.0000 mg | INTRAVENOUS | Status: DC | PRN

## 2019-11-20 MED ORDER — INSULIN REGULAR(HUMAN) IN NACL 100-0.9 UT/100ML-% IV SOLN
INTRAVENOUS | Status: DC | PRN
Start: 2019-11-20 — End: 2019-11-20
  Administered 2019-11-20: 5.5 [IU]/h via INTRAVENOUS

## 2019-11-20 MED ORDER — SUFENTANIL CITRATE 50 MCG/ML IV SOLN
INTRAVENOUS | Status: AC
  Filled 2019-11-20: qty 1

## 2019-11-20 MED ORDER — SENNA 8.6 MG PO TABS: 1.0000 | ORAL_TABLET | Freq: Two times a day (BID) | ORAL | Status: DC

## 2019-11-20 MED ORDER — PROPOFOL 10 MG/ML IV BOLUS
INTRAVENOUS | Status: AC
  Filled 2019-11-20: qty 20

## 2019-11-20 MED ORDER — PHENYLEPHRINE HCL-NACL 10-0.9 MG/250ML-% IV SOLN
INTRAVENOUS | Status: DC | PRN
  Administered 2019-11-20: 50 ug/min via INTRAVENOUS

## 2019-11-20 MED ORDER — ETOMIDATE 2 MG/ML IV SOLN
10.0000 mg | Freq: Once | INTRAVENOUS | Status: AC
  Administered 2019-11-20: 10 mg via INTRAVENOUS

## 2019-11-20 MED ORDER — NIMODIPINE 30 MG PO CAPS
60.0000 mg | ORAL_CAPSULE | ORAL | Status: AC
  Administered 2019-12-07: 60 mg via ORAL

## 2019-11-20 MED ORDER — THROMBIN 20000 UNITS EX SOLR: CUTANEOUS | Status: DC | PRN

## 2019-11-20 MED ORDER — BISACODYL 5 MG PO TBEC: 5.0000 mg | DELAYED_RELEASE_TABLET | Freq: Every day | ORAL | Status: DC | PRN

## 2019-11-20 SURGICAL SUPPLY — 96 items
APL SKNCLS STERI-STRIP NONHPOA (GAUZE/BANDAGES/DRESSINGS)
ATTRACTOMAT 16X20 MAGNETIC DRP (DRAPES) ×1 IMPLANT
BAG DECANTER FOR FLEXI CONT (MISCELLANEOUS) ×2 IMPLANT
BAND INSRT 18 STRL LF DISP RB (MISCELLANEOUS) ×2
BAND RUBBER #18 3X1/16 STRL (MISCELLANEOUS) ×4 IMPLANT
BENZOIN TINCTURE PRP APPL 2/3 (GAUZE/BANDAGES/DRESSINGS) IMPLANT
BIT DRILL WIRE PASS 1.3MM (BIT) IMPLANT
BLADE SAW GIGLI 16 STRL (MISCELLANEOUS) IMPLANT
BLADE SURG 15 STRL LF DISP TIS (BLADE) ×1 IMPLANT
BLADE SURG 15 STRL SS (BLADE) ×2
BNDG CMPR 75X41 PLY ABS (GAUZE/BANDAGES/DRESSINGS) ×1
BNDG CMPR 75X41 PLY HI ABS (GAUZE/BANDAGES/DRESSINGS)
BNDG GAUZE ELAST 4 BULKY (GAUZE/BANDAGES/DRESSINGS) ×2 IMPLANT
BNDG STRETCH 4X75 NS LF (GAUZE/BANDAGES/DRESSINGS) ×1 IMPLANT
BNDG STRETCH 4X75 STRL LF (GAUZE/BANDAGES/DRESSINGS) IMPLANT
BUR ACORN 6.0 PRECISION (BURR) ×1 IMPLANT
BUR MATCHSTICK NEURO 3.0 LAGG (BURR) IMPLANT
BUR ROUND FLUTED 4 SOFT TCH (BURR) IMPLANT
BUR ROUND FLUTED 5 RND (BURR) IMPLANT
BUR SPIRAL ROUTER 2.3 (BUR) ×1 IMPLANT
CANISTER SUCT 3000ML PPV (MISCELLANEOUS) ×3 IMPLANT
CARTRIDGE OIL MAESTRO DRILL (MISCELLANEOUS) ×1 IMPLANT
CLIP ANEURY TI MINI LAT ANG 5M (Clip) ×1 IMPLANT
COVER BURR HOLE UNIV 10 (Orthopedic Implant) ×1 IMPLANT
COVER MAYO STAND STRL (DRAPES) ×1 IMPLANT
DECANTER SPIKE VIAL GLASS SM (MISCELLANEOUS) ×2 IMPLANT
DIFFUSER DRILL AIR PNEUMATIC (MISCELLANEOUS) ×2 IMPLANT
DRAPE MICROSCOPE LEICA (MISCELLANEOUS) ×2 IMPLANT
DRAPE NEUROLOGICAL W/INCISE (DRAPES) ×2 IMPLANT
DRAPE WARM FLUID 44X44 (DRAPES) ×2 IMPLANT
DRILL WIRE PASS 1.3MM (BIT)
DRSG ADAPTIC 3X8 NADH LF (GAUZE/BANDAGES/DRESSINGS) ×3 IMPLANT
DRSG TELFA 3X8 NADH (GAUZE/BANDAGES/DRESSINGS) ×2 IMPLANT
DURAPREP 6ML APPLICATOR 50/CS (WOUND CARE) ×2 IMPLANT
ELECT REM PT RETURN 9FT ADLT (ELECTROSURGICAL) ×2
ELECTRODE REM PT RTRN 9FT ADLT (ELECTROSURGICAL) ×1 IMPLANT
EVACUATOR SILICONE 100CC (DRAIN) IMPLANT
FORCEPS BIPOLAR SPETZLER 8 1.0 (NEUROSURGERY SUPPLIES) ×1 IMPLANT
GAUZE 4X4 16PLY RFD (DISPOSABLE) IMPLANT
GAUZE SPONGE 4X4 12PLY STRL (GAUZE/BANDAGES/DRESSINGS) IMPLANT
GLOVE BIO SURGEON STRL SZ7.5 (GLOVE) IMPLANT
GLOVE BIOGEL PI IND STRL 7.5 (GLOVE) ×2 IMPLANT
GLOVE BIOGEL PI INDICATOR 7.5 (GLOVE) ×2
GLOVE ECLIPSE 7.0 STRL STRAW (GLOVE) ×4 IMPLANT
GOWN STRL REUS W/ TWL LRG LVL3 (GOWN DISPOSABLE) ×2 IMPLANT
GOWN STRL REUS W/ TWL XL LVL3 (GOWN DISPOSABLE) IMPLANT
GOWN STRL REUS W/TWL 2XL LVL3 (GOWN DISPOSABLE) IMPLANT
GOWN STRL REUS W/TWL LRG LVL3 (GOWN DISPOSABLE) ×4
GOWN STRL REUS W/TWL XL LVL3 (GOWN DISPOSABLE)
GRAFT DURAGEN MATRIX 3WX3L (Graft) ×2 IMPLANT
GRAFT DURAGEN MATRIX 3X3 SNGL (Graft) IMPLANT
HEMOSTAT POWDER KIT SURGIFOAM (HEMOSTASIS) ×2 IMPLANT
HEMOSTAT SURGICEL 2X14 (HEMOSTASIS) ×3 IMPLANT
HOOK DURA 1/2IN (MISCELLANEOUS) ×2 IMPLANT
KIT BASIN OR (CUSTOM PROCEDURE TRAY) ×2 IMPLANT
KIT DRAIN CSF ACCUDRAIN (MISCELLANEOUS) IMPLANT
KIT TURNOVER KIT B (KITS) ×2 IMPLANT
KNIFE ARACHNOID DISP AM-24-S (MISCELLANEOUS) ×1 IMPLANT
NDL HYPO 25X1 1.5 SAFETY (NEEDLE) ×1 IMPLANT
NDL SPNL 25GX3.5 QUINCKE BL (NEEDLE) IMPLANT
NEEDLE HYPO 22GX1.5 SAFETY (NEEDLE) IMPLANT
NEEDLE HYPO 25X1 1.5 SAFETY (NEEDLE) ×2 IMPLANT
NEEDLE SPNL 25GX3.5 QUINCKE BL (NEEDLE) IMPLANT
NS IRRIG 1000ML POUR BTL (IV SOLUTION) ×2 IMPLANT
OIL CARTRIDGE MAESTRO DRILL (MISCELLANEOUS) ×2
PACK BATTERY CMF DISP FOR DVR (ORTHOPEDIC DISPOSABLE SUPPLIES) ×1 IMPLANT
PACK CRANIOTOMY CUSTOM (CUSTOM PROCEDURE TRAY) ×2 IMPLANT
PAD ARMBOARD 7.5X6 YLW CONV (MISCELLANEOUS) ×2 IMPLANT
PAD DRESSING TELFA 3X8 NADH (GAUZE/BANDAGES/DRESSINGS) ×1 IMPLANT
PATTIES SURGICAL .25X.25 (GAUZE/BANDAGES/DRESSINGS) IMPLANT
PATTIES SURGICAL .5 X.5 (GAUZE/BANDAGES/DRESSINGS) IMPLANT
PATTIES SURGICAL .5 X3 (DISPOSABLE) IMPLANT
PATTIES SURGICAL 1/4 X 3 (GAUZE/BANDAGES/DRESSINGS) IMPLANT
PATTIES SURGICAL 1X1 (DISPOSABLE) IMPLANT
PERFORATOR LRG  14-11MM (BIT)
PERFORATOR LRG 14-11MM (BIT) IMPLANT
PIN MAYFIELD SKULL DISP (PIN) IMPLANT
PLATE UNIV CMF 16 2H (Plate) ×1 IMPLANT
SCREW UNIII AXS SD 1.5X4 (Screw) ×11 IMPLANT
SPONGE NEURO XRAY DETECT 1X3 (DISPOSABLE) IMPLANT
SPONGE SURGIFOAM ABS GEL 100 (HEMOSTASIS) ×2 IMPLANT
STAPLER VISISTAT 35W (STAPLE) ×2 IMPLANT
STOCKINETTE 6  STRL (DRAPES) ×2
STOCKINETTE 6 STRL (DRAPES) ×1 IMPLANT
SUT ETHILON 3 0 FSL (SUTURE) IMPLANT
SUT NURALON 4 0 TR CR/8 (SUTURE) ×4 IMPLANT
SUT VIC AB 0 CT1 18XCR BRD8 (SUTURE) ×2 IMPLANT
SUT VIC AB 0 CT1 8-18 (SUTURE) ×6
SUT VIC AB 3-0 SH 8-18 (SUTURE) ×4 IMPLANT
TAPE CLOTH 1X10 TAN NS (GAUZE/BANDAGES/DRESSINGS) ×2 IMPLANT
TAPE PAPER 1X10 WHT MICROPORE (GAUZE/BANDAGES/DRESSINGS) ×1 IMPLANT
TIP NONSTICK .5MMX23CM (INSTRUMENTS)
TIP NONSTICK .5X23 (INSTRUMENTS) IMPLANT
TOWEL GREEN STERILE (TOWEL DISPOSABLE) ×2 IMPLANT
TOWEL GREEN STERILE FF (TOWEL DISPOSABLE) ×2 IMPLANT
WATER STERILE IRR 1000ML POUR (IV SOLUTION) ×2 IMPLANT

## 2019-11-20 NOTE — Op Note (Signed)
NEUROSURGERY OPERATIVE NOTE   PREOP DIAGNOSIS:  1. Subarachnoid Hemorrhage 2. Left middle cerebral artery aneurysm   POSTOP DIAGNOSIS: Same  PROCEDURE: 1. Left pterional craniotomy for clipping of MCA aneurysm, simple 2. Use of intraoperative microscope for microdissection 3. Intraoperative indocyanine green florescence viedoangiogrphy   SURGEON: Dr. [name], MD  ASSISTANT: Ferne Reus, PA-C  ANESTHESIA: General Endotracheal  EBL: 250cc  SPECIMENS: None  DRAINS: None  COMPLICATIONS: None immediate  CONDITION: Hemodynamically stable to ICU  HISTORY: Grabill is a 69 y.o. female admitted after being found unresponsive.  Work-up included CT scan demonstrating diffuse subarachnoid hemorrhage, with CT angiogram demonstrating a left middle cerebral artery aneurysm.  The patient became progressively more obtunded overnight requiring placement of external ventricular drain and endotracheal intubation for airway protection.  She underwent diagnostic cerebral angiogram demonstrating the presence of a left middle cerebral artery aneurysm which was not amenable to coil embolization.  She therefore was brought to the operating room for surgical clip ligation.  Prior to the angiogram, the risks, benefits, and alternatives to the surgery were reviewed in detail with the patient's daughter.  After all her questions were answered she provided consent on her mother's behalf.  PROCEDURE IN DETAIL: The patient was brought to the operating room. After induction of general anesthesia, the patient was positioned on the operative table in the [place] head holder in the supine position. All pressure points were meticulously padded.  Standard reverse question-mark skin incision was then marked out and prepped and draped in the usual sterile fashion.  After timeout was conducted, skin incision was skin incision was infiltrated with local anesthetic without epinephrine.  Incision was  then made subarachnoid dissection was carried out carried down through the galea.  Hemostasis was secured with Raney clips.  The temporalis muscle and fascia was then also incised.  Single piece myocutaneous flap was then elevated and retracted anteriorly.  Standard frontotemporal craniotomy flap was then elevated.  Hemostasis on the epidural plane was secured with bipolar electrocautery.  Leksell rongeurs were then used to remove some of the squamosal temporal bone.  High-speed drill was then used to drill down the lesser wing of the sphenoid in order to allow direct visualization of the opticocarotid cistern.  The dura was then opened in curvilinear fashion.  This was tacked up with 4-0 Nurolon stitches.  The microscope was then draped sterilely and brought into the field and the remainder of the case was done under the microscope using microdissection technique.  Initially a initially a subfrontal approach was employed to identify the optic nerve.  The arachnoid overlying the optic nerve was then opened sharply.  Dissection was then carried laterally to identify the supraclinoid internal carotid artery.  Egress of CSF allowed a fair bit of brain relaxation.  In addition, the patient was given mannitol by the anesthesia service prior to craniotomy, and the external ventricular drain was opened to 20 mmHg.  At this point, attention was turned more to the distal sylvian fissure.  This was opened sharply.  Using a combination of dissectors, the distal sylvian fissure was opened and dissection was carried medially until the proximal portion of the fissure was opened.  This allowed [place] to then dissect along the carotid artery and identify the ICA bifurcation.  Further dissection along the M1 identify the anterior temporal branch.  We were then able to identify the major branches identified on the preoperative angiogram.  The neck of the aneurysm was also identified.  In  order to place a clip, I did have to dissect  the inferior branch away from the dome of the aneurysm.  This then allowed dissection around the neck of the aneurysm in order to safely place a clip without occluding the inferior division.  An angled standard titanium clip was selected and placed across the aneurysm neck.  This then allowed further dissection around the dome of the aneurysm and allowed complete visualization of all the MCA branches including the superior division, inferior division, and anterior temporal branch.  All branches appeared to be widely patent.  The aneurysm appeared to be completely clipped.  At this point, the anesthesia service administered a bolus of 12.5 mg of indocyanine green.  Video angiography was reviewed and demonstrated patency of the internal carotid artery, M1, both M2 divisions, and the anterior temporal division.  The aneurysm was completely occluded.  We then irrigated the field with a copious amount of normal saline irrigation.  There was no active bleeding identified.  The dura was then approximated with interrupted 4-0 Nurolon stitches.  A layer of DuraGen onlay graft was placed.  The bone flap was then replaced and plated with standard titanium plates and screws.  The wound was again irrigated.  The galea was approximated with interrupted 0 and 3-0 Vicryl stitches and the skin was closed with staples.  Bacitracin ointment and sterile dressings were applied.  The patient was then removed from the [place] head holder and a sterile head wrap was applied.  At the end of the case all sponge, needle, instrument, and cottonoid counts were correct.  The patient was then transferred to the stretcher and taken to the intensive care unit in critical but stable hemodynamic condition.

## 2019-11-20 NOTE — Progress Notes (Signed)
Anesthesia present for case, CRNA Zach at bedside

## 2019-11-20 NOTE — Brief Op Note (Signed)
  NEUROSURGERY BRIEF OPERATIVE  NOTE   PREOP DX: Subarachnoid Hemorrhage  POSTOP DX: Same  PROCEDURE: Diagnostic cerebral angiogram  SURGEON: Dr. [name], MD  ANESTHESIA: GETA  EBL: Minimal  SPECIMENS: None  COMPLICATIONS: None  CONDITION: Stable to recovery  FINDINGS (Full report in [facility]): 1. Left MCA aneurysm as the source of SAH measuring 3.5 x 2.62mm, not amenable to coil embolization due to wide-neck morphology and incorporation of the MCA branches at the neck. 2. No significant vasospasm

## 2019-11-20 NOTE — Anesthesia Procedure Notes (Signed)
Date/Time: 11/20/2019 10:36 AM Performed by: Mariea Clonts, CRNA Pre-anesthesia Checklist: Patient identified, Emergency Drugs available, Suction available, Patient being monitored and Timeout performed Patient Re-evaluated:Patient Re-evaluated prior to induction Oxygen Delivery Method: Circle system utilized Preoxygenation: Pre-oxygenation with 100% oxygen Induction Type: Inhalational induction with existing ETT Placement Confirmation: positive ETCO2 and breath sounds checked- equal and bilateral

## 2019-11-20 NOTE — Anesthesia Procedure Notes (Signed)
Arterial Line Insertion Start/End6/12/2019 10:46 AM, 11/20/2019 10:51 AM Performed by: CRNA  Patient location: OR. Preanesthetic checklist: patient identified, IV checked, site marked, risks and benefits discussed, surgical consent, monitors and equipment checked, pre-op evaluation, timeout performed and anesthesia consent Patient sedated Left, radial was placed Catheter size: 20 G Hand hygiene performed  and maximum sterile barriers used   Attempts: 1 Procedure performed without using ultrasound guided technique. Following insertion, dressing applied and Biopatch. Post procedure assessment: normal  Patient tolerated the procedure well with no immediate complications.

## 2019-11-20 NOTE — Progress Notes (Cosign Needed)
FOR EDUCATION USE ONLY - NOT OFFICIAL PART OF MEDICAL RECORD  NAME:  [place], MRN:  [id number], DOB:  25-Oct-1950, LOS: 1 ADMISSION DATE:  11/19/2019, CONSULTATION DATE:  11/20/19 REFERRING MD:  Zada Finders, CHIEF COMPLAINT:  SAH    Brief History   69 year old female with PMH significant for seizures, previous CVA's, HTN, and known left MCA aneurysm who reports for sudden unresponsiveness, left-sided facial droop, and right upper extremity weakness. CT Head revealed large volume left sided SAH with 43mm midline shift.   History of present illness   69 year old female with a PMH significant for seizure disorders, previous CVA's, and HTN.  LKWT approximately around noon on 6/6. Her niece reports that she slumped over and became unresponsive. This was associated with left-sided facial droop and right upper extremity weakness. Reportedly, she had a known left MCA aneurysm that was being monitored. CT head revealed large volume subarachnoid hemorrhage overlying the left cerebral hemisphere with 52mm left to right midline shift. Overnight, she acutely decompensated and required placement of an right frontal ventriculostomy by NSG. PCCM was consulted for concern for airway protection for which she was intubated.   Past Medical History  CVA (No residual deficits) HTN Seizures DM II   Significant Hospital Events   6/6 Admitted to [facility] 6/7 Acute decompensation - right frontal EVD placed. Intubated for airway protection  Consults:  PCCM Neurology  Procedures:  6/7 EVD >> 6/7 ETT >>  Significant Diagnostic Tests:  6/7 CT Head >> Large volume SAH overlying left cerebral hemisphere with  left to right midline shift. New hydrocephalus of lateral and third vents. SAH reflux into occipital horns of lateral vents.   6/6 CTA Neck >> Patent common and internal carotids without significant stenosis. Mixed plaque within proximal left ICA. Patent vertebrals.   6/6 CTA Head >> 3x2 mm left MCA  bifurcation aneurysm (increased in size from April 2020). No intracranial LVO. High grade focal stenosis within inferior division mid M2 left MCA  Micro Data:  6/6 SARS CoV2 >> Negative  Antimicrobials:     Interim history/subjective:  Admitted yesterday for large volume SAH overlying left cerebral hemisphere with associated left to right midline shift, thought to be from previously known left MCA aneurysm.   Early this morning, patient became more obtunded and was noted to have new hydrocephalus of the lateral and third vents with some reflux of subarachnoid blood into the horns of the lateral vents. Neurosurgery emergently placed a right frontal ventriculostomy.  PCCM was consulted out of concern for airway protection and she was intubated.   Objective   Blood pressure 121/61, pulse 75, temperature (!) 100.7 F (38.2 C), temperature source Axillary, resp. rate (!) 24, height 5\' 6"  (1.676 m), weight 77.8 kg, SpO2 98 %.        Intake/Output Summary (Last 24 hours) at 11/20/2019 0804 Last data filed at 11/20/2019 0636 Gross per 24 hour  Intake 1264.91 ml  Output 850 ml  Net 414.91 ml   Filed Weights   11/19/19 1529  Weight: 77.8 kg    Examination: General: Elderly female laying in bed with eyes closed. No obvious distress HENT: Non-icteric sclera. MMM. Right sided facial droop. Gurgling from oropharyngeal cavity. Lungs: Lungs coarse crackles bilateral lung fields. No accessory muscle use. No signs of respiratory distress. Cardiovascular: Normal S1/S2. Sinus rhythm on monitor. No JVD. No murmurs or rubs. Abdomen: Soft, non-distended, and non-tender to palpation Extremities: No peripheral edema. Radial and DP  pulses 2+. Normal muscle tone and bulk without joint deformities Neuro: Does not follow commands. Opens eyes to deep painful stimulation. Right upper arm extensor posturing with pain. Left arm localizes. PERRL. 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 Left cerebral hemisphere SAH with right to left midline shift. Right frontal ventriculostomy at 10 cmH20 placed by NSG today for obtundation and new hydrocephalus  Plan: -Management per NSG.  -Plan for angiogram today with intervention based on findings -Neuro check q1h -Nimodipine for vasospasm prevention -Fever control with APAP to prevent secondary injury  Suspected AKI  Versus CKD. Creatinine 1.3 today, unable to access previous renal function labs.   Plan: -0.9% NaCl at 123mL/h for gentle hydration, in setting of planned angio today -Continue to monitor and trend renal indices.   Hypertension, Hydralazine 50mg  TID and amlodipine 10mg  daily at home. Currently on clevidipine drip.  Plan: -SBP < 140  -Consider resuming home meds once aneurysm secured  History of seizures Takes Keppra 500mg  BID at home. No seizure activity noted during admission  Plan:  -Continue Keppra 500mg  BID IV -Seizure precautions  DM II No home medications  Plan: -Consider obtaining Hgb A1c -Keep BG <180 mg/dL  Best practice:  Diet: NPO, planned procedure today Pain/Anxiety/Delirium protocol (if indicated): Propofol and fentanyl pushes VAP protocol (if indicated): Yes DVT prophylaxis: Mechanical  GI prophylaxis: PPI Glucose control: < 180mg /dL. Not requiring SSI Mobility: BR Code Status: FULL Family Communication: Yes 6/7 AM Dr. Elsworth Soho Disposition: Neuro ICU  Labs   CBC: Recent Labs  Lab 11/19/19 1459 11/19/19 1507  WBC 5.6  --   NEUTROABS 3.2  --   HGB 10.9* 10.5*  HCT 33.5*  31.0*  MCV 93.8  --   PLT 388  --     Basic Metabolic Panel: Recent Labs  Lab 11/19/19 1459 11/19/19 1507  NA 139 141  K 4.2 3.9  CL 110 110  CO2 20*  --   GLUCOSE 188* 187*  BUN 13 16  CREATININE 1.25* 1.30*  CALCIUM 8.9  --    GFR: Estimated Creatinine Clearance: 42.4 mL/min (A) (by C-G formula based on SCr of 1.3 mg/dL (H)). Recent Labs  Lab 11/19/19 1459  WBC 5.6    Liver Function Tests: Recent Labs  Lab 11/19/19 1459  AST 12*  ALT 7  ALKPHOS 99  BILITOT 0.6  PROT 6.4*  ALBUMIN 3.0*   No results for input(s): LIPASE, AMYLASE in the last 168 hours. No results for input(s): AMMONIA in the last 168 hours.  ABG    Component Value Date/Time   TCO2 21 (L) 11/19/2019 1507     Coagulation Profile: Recent Labs  Lab 11/19/19 1459  INR 1.0    Cardiac Enzymes: No results for input(s): CKTOTAL, CKMB, CKMBINDEX, TROPONINI in the last 168 hours.  HbA1C: No results found for: HGBA1C  CBG: Recent Labs  Lab 11/19/19 1454  GLUCAP 169*    Review of Systems:   Unable to obtain due to intubation / sedation / encephalopathy  Past Medical History  She,  has a past medical history of CVA (cerebral  vascular accident) ([place]), HTN (hypertension), and Seizure ([place]).   Surgical History   History reviewed. No pertinent surgical history.   Social History      Family History   Her family history is not on file.   Allergies Allergies  Allergen Reactions   Penicillins Rash     Home Medications  Prior to Admission medications   Medication Sig Start Date End Date Taking? Authorizing Provider  amLODipine (NORVASC) 10 MG tablet Take 10 mg by mouth daily. 09/25/19  Yes [provider]  aspirin EC 81 MG tablet Take 81 mg by mouth daily.   Yes [provider]  hydrALAZINE (APRESOLINE) 50 MG tablet Take 50 mg by mouth 3 (three) times daily. 10/30/19  Yes [provider]  HYDROcodone-acetaminophen (NORCO/VICODIN) 5-325 MG tablet Take 1  tablet by mouth 3 (three) times daily. 11/02/19  Yes [provider]  levETIRAcetam (KEPPRA) 500 MG tablet Take 500 mg by mouth 2 (two) times daily. For seizure 10/29/19  Yes [provider]  linaclotide (LINZESS) 145 MCG CAPS capsule Take 145 mcg by mouth daily before breakfast.   Yes [provider]  loratadine (CLARITIN) 10 MG tablet Take 10 mg by mouth daily. For sinus congestion 11/01/19  Yes [provider]  meloxicam (MOBIC) 7.5 MG tablet Take 7.5 mg by mouth daily. Take with food - for pain 10/29/19  Yes [provider]  omeprazole (PRILOSEC) 20 MG capsule Take 20 mg by mouth daily. 10/30/19  Yes [provider]     Critical care time: 50 minutes    Electronically signed by: Adonis Brook, [place], 11/20/19 [id number]

## 2019-11-20 NOTE — Progress Notes (Signed)
  NEUROSURGERY PROGRESS NOTE   Pts admission history and events overnight reviewed. Had weakness for a day then became unresponsive and brought to ED. Has PMHx sig for HTN, SZ, and hx of stroke. Unknown smoking or family hx.  EXAM:  BP 112/65 Comment: cleviprex off  Pulse 91   Temp 99.3 F (37.4 C) (Axillary)   Resp 16   Ht 5\' 6"  (1.676 m)   Wt 77.8 kg   SpO2 100%   BMI 27.68 kg/m   Pt intubated ~38min prior with paralytics No eye opening Pupils 38mm No spontaneous breathing No motor responses  IMAGING: CT demonstrates diffuse basal SAH eccentric into left sylvian fissure and repeat CT shows progressive ventriculomegaly.  CTA reveals ~2-28mm LMCA aneurysm projecting laterally and superiorly and appears to arise from the proximal portion of the inferior division LMCA.  IMPRESSION:  69 y.o. 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 We discussed the need for angiogram and treatment of aneurysm to prevent rebleeding. We discussed risks of both angiogram and surgery including possibility of stroke or potentially life-threatening hemorrhage. All her questions were answered and she provided consent on her mother's behalf to proceed.

## 2019-11-20 NOTE — Anesthesia Postprocedure Evaluation (Signed)
Anesthesia Post Note  Patient: [place]  Procedure(s) Performed: LEFT CRANIOTOMY INTRACRANIAL ANEURYSM CLIPPING (Left ) IR WITH ANESTHESIA (N/A )     Patient location during evaluation: NICU Anesthesia Type: General Level of consciousness: sedated and patient remains intubated per anesthesia plan Pain management: pain level controlled Vital Signs Assessment: post-procedure vital signs reviewed and stable Respiratory status: patient remains intubated per anesthesia plan and patient on ventilator - see flowsheet for VS Cardiovascular status: stable Anesthetic complications: no    Last Vitals:  Vitals:   11/20/19 2037 11/20/19 2038  BP:  (!) 109/59  Pulse:  96  Resp:  19  Temp:    SpO2: 100% 100%    Last Pain:  Vitals:   11/20/19 2000  TempSrc: Axillary  PainSc:                  JOSLIN,DAVID COKER

## 2019-11-20 NOTE — Anesthesia Preprocedure Evaluation (Signed)
Anesthesia Evaluation  Patient identified by MRN, date of birth, ID band Patient awake    Reviewed: Allergy & Precautions, NPO status , Patient's Chart, lab work & pertinent test results  Airway Mallampati: Intubated       Dental   Pulmonary    breath sounds clear to auscultation       Cardiovascular hypertension,  Rhythm:Regular Rate:Tachycardia     Neuro/Psych    GI/Hepatic   Endo/Other    Renal/GU      Musculoskeletal   Abdominal   Peds  Hematology   Anesthesia Other Findings   Reproductive/Obstetrics                             Anesthesia Physical Anesthesia Plan  ASA: IV and emergent  Anesthesia Plan: General   Post-op Pain Management:    Induction:   PONV Risk Score and Plan:   Airway Management Planned: Oral ETT  Additional Equipment: Arterial line, CVP and Ultrasound Guidance Line Placement  Intra-op Plan:   Post-operative Plan: Post-operative intubation/ventilation  Informed Consent: I have reviewed the patients History and Physical, chart, labs and discussed the procedure including the risks, benefits and alternatives for the proposed anesthesia with the patient or authorized representative who has indicated his/her understanding and acceptance.       Plan Discussed with: CRNA and Anesthesiologist  Anesthesia Plan Comments:         Anesthesia Quick Evaluation

## 2019-11-20 NOTE — Consult Note (Signed)
NAME:  [place], MRN:  [id number], DOB:  06/24/1950, LOS: 1 ADMISSION DATE:  11/19/2019, CONSULTATION DATE:  11/20/19 REFERRING MD:  Dr. Zada Finders, CHIEF COMPLAINT:  AMS, Respiratory Failure  Brief History   68F w/ reported h/o epilepsy, remote h/o stroke, and known L MCA aneurysm who presented w/ AMS and weakness for ~24 hours and found to have diffuse SAH tracking toward left sylvian fissure on CTH/CTA. PCCM consulted for airway management.   History of present illness   This is a 69 y.o. woman with a reported history of epilepsy that presents with acute change in mental status. Pt unable to provide any further information due to altered mental status, remainder of history obtained from chart review and report from family. She had a stroke 10y ago but reportedly has no residual deficits. She has had weakness for roughly 24h then became unresponsive today. Per report, she has a known L MCA aneurysm that was being monitored. By report, no use of antiplatelet or anticoagulant medications but outpatient medications are not yet listed in EMR.   Past Medical History  CVA HTN Seizure L MCA aneurysm  Significant Hospital Events   6/6 Admission 6/6 CTH/ CTA: 3 x 2 mm left MCA bifurcation aneurysm  Consults:  Neurosurgery PCCM  Procedures:  6/6 R frontal ventriculostomy 6/7 R F EVD > 6/7 ETT >   Significant Diagnostic Tests:  6/6 NWG:NFAOZ is large volume acute subarachnoid hemorrhage overlying the left cerebral hemisphere, within the left sylvian fissure, within the left MCA cistern, within the interhemispheric fissure and within the basal cisterns. Subarachnoid or subdural hemorrhage is also present along the tentorium bilaterally. + Mass effect w/ 39mm rightward midline shift w/out hydrocephalus 6/6 CTA: 3 x 2 mm left MCA bifurcation aneurysm. By report, this has increased in size since prior MRA head 10/13/2018 (measured at 2 x 2 mm at that time).  6/7 CTH: 1. New hydrocephalus of  the lateral and third ventricles. 2. Subarachnoid hemorrhage has refluxed into the occipital horns of the lateral ventricles and into the cerebral aqua duct. 3. Degree of extraventricular hemorrhage is similar to prior, no suspected rebleeding.  6/7 CXR:  Micro Data:  6/6 SARS COV-2 Neg, MRSA neg   Antimicrobials:  N/a  Interim history/subjective:  The patient became obtunded this morning and required a R F EVD emergently placed. PCCM was consulted for intubation and mechanical ventilation support given the progression of the patient's neurological derangements.   Objective   Blood pressure (!) 127/55, pulse 87, temperature (!) 100.7 F (38.2 C), temperature source Axillary, resp. rate (!) 24, height 5\' 6"  (1.676 m), weight 77.8 kg, SpO2 96 %.        Intake/Output Summary (Last 24 hours) at 11/20/2019 0805 Last data filed at 11/20/2019 0636 Gross per 24 hour  Intake 1264.91 ml  Output 850 ml  Net 414.91 ml   Filed Weights   11/19/19 1529  Weight: 77.8 kg    Examination: General: Acutely ill appearing elderly woman lying in hospital bed  HENT: Normocephalic, R ventriculostomy catheter visible with scant bleeding from site of entry Lungs: +Bilateral rhonchi, breathing comfortably on ventilator Cardiovascular: Tachycardic, no m/r/g Abdomen: S, NT, ND Extremities: Warm and well perfused x4 Neuro: Pt obtunded with downgaze on right and lateral gaze on left. Pupils react to light. +gag reflex. Pt withdraws from painful stimuli. Does not open eyes to voice or respond to commands.   No spontaneous movement  Resolved Hospital Problem list   n/a  Assessment & Plan:   [facility]  - Per neurosurgery recommendations - Catheter angiogram this morning w/ Dr. Kathyrn Sheriff  followed by treatment (clipping vs coiling, depending on angiogram findings) -SBP<140 -Cr 1.25 c/w AKI, repeat BMP to prep for angiogram  H/o epilepsy - Con't keppra  Respiratory Failure due to AMS - Continue  mechanical ventilation - Con't sedation w/ fentanyl and propfol - Routine oral care - Continue PPI  - Consider tracheal aspirate culture for possible aspiration PNA   Best practice:  Diet: NPO Pain/Anxiety/Delirium protocol (if indicated): Fentanyl + propfol VAP protocol (if indicated): Per protocol DVT prophylaxis: SCDs GI prophylaxis: PPI Glucose control: n/a Mobility: BR Code Status: Full Family Communication: Discussed care with daughter over phone Disposition: ICU   Labs   CBC: Recent Labs  Lab 11/19/19 1459 11/19/19 1507  WBC 5.6  --   NEUTROABS 3.2  --   HGB 10.9* 10.5*  HCT 33.5* 31.0*  MCV 93.8  --   PLT 388  --     Basic Metabolic Panel: Recent Labs  Lab 11/19/19 1459 11/19/19 1507  NA 139 141  K 4.2 3.9  CL 110 110  CO2 20*  --   GLUCOSE 188* 187*  BUN 13 16  CREATININE 1.25* 1.30*  CALCIUM 8.9  --    GFR: Estimated Creatinine Clearance: 42.4 mL/min (A) (by C-G formula based on SCr of 1.3 mg/dL (H)). Recent Labs  Lab 11/19/19 1459  WBC 5.6    Liver Function Tests: Recent Labs  Lab 11/19/19 1459  AST 12*  ALT 7  ALKPHOS 99  BILITOT 0.6  PROT 6.4*  ALBUMIN 3.0*   No results for input(s): LIPASE, AMYLASE in the last 168 hours. No results for input(s): AMMONIA in the last 168 hours.  ABG    Component Value Date/Time   TCO2 21 (L) 11/19/2019 1507     Coagulation Profile: Recent Labs  Lab 11/19/19 1459  INR 1.0    Cardiac Enzymes: No results for input(s): CKTOTAL, CKMB, CKMBINDEX, TROPONINI in the last 168 hours.  HbA1C: No results found for: HGBA1C  CBG: Recent Labs  Lab 11/19/19 1454  GLUCAP 169*    Review of Systems:   Unable to obtain at this time due to limited patient participation.  Past Medical History  She,  has a past medical history of CVA (cerebral vascular accident) ([place]), HTN (hypertension), and Seizure ([place]).   Surgical History   History reviewed. No pertinent surgical history.   Social History       Family History   Her family history is not on file.   Allergies Allergies  Allergen Reactions  . Penicillins Rash     Home Medications  Prior to Admission medications   Medication Sig Start Date End Date Taking? Authorizing Provider  amLODipine (NORVASC) 10 MG tablet Take 10 mg by mouth daily. 09/25/19  Yes [provider]  aspirin EC 81 MG tablet Take 81 mg by mouth daily.   Yes [provider]  hydrALAZINE (APRESOLINE) 50 MG tablet Take 50 mg by mouth 3 (three) times daily. 10/30/19  Yes [provider]  HYDROcodone-acetaminophen (NORCO/VICODIN) 5-325 MG tablet Take 1 tablet by mouth 3 (three) times daily. 11/02/19  Yes [provider]  levETIRAcetam (KEPPRA) 500 MG tablet Take 500 mg by mouth 2 (two) times daily. For seizure 10/29/19  Yes [provider]  linaclotide (LINZESS) 145 MCG CAPS capsule Take 145 mcg by mouth daily before breakfast.   Yes [provider]  loratadine (CLARITIN) 10 MG tablet Take 10 mg by mouth daily. For sinus congestion 11/01/19  Yes [provider]  meloxicam (MOBIC) 7.5 MG tablet Take 7.5 mg by mouth daily. Take with food - for pain 10/29/19  Yes [provider]  omeprazole (PRILOSEC) 20 MG capsule Take 20 mg by mouth daily. 10/30/19  Yes [provider]     Critical care time:       Lorne Skeens, Medical Student

## 2019-11-20 NOTE — Anesthesia Procedure Notes (Signed)
Central Venous Catheter Insertion Performed by: Roberts Gaudy, MD, anesthesiologist Start/End6/12/2019 10:50 AM, 11/20/2019 10:55 AM Patient location: OR. Preanesthetic checklist: patient identified, IV checked, site marked, risks and benefits discussed, surgical consent, monitors and equipment checked, pre-op evaluation, timeout performed and anesthesia consent Lidocaine 1% used for infiltration and patient sedated Hand hygiene performed  and maximum sterile barriers used  Catheter size: 8 Fr Total catheter length 16. Central line was placed.Double lumen Procedure performed using ultrasound guided technique. Ultrasound Notes:anatomy identified, needle tip was noted to be adjacent to the nerve/plexus identified, no ultrasound evidence of intravascular and/or intraneural injection and image(s) printed for medical record Attempts: 1 Following insertion, dressing applied and line sutured. Post procedure assessment: blood return through all ports  Patient tolerated the procedure well with no immediate complications.

## 2019-11-20 NOTE — Progress Notes (Signed)
R F EVD emergently placed, pt obtunded with downgaze on right and lateral gaze on left. Still recovering, will give her 30 minutes to improve her exam and protect her airway, otherwise will require intubation.

## 2019-11-20 NOTE — Procedures (Signed)
Intubation Procedure Note BIRTTANY DECHELLIS [id number] Feb 17, 1951  Procedure: Intubation Indications: Airway protection and maintenance  Procedure Details Consent: Risks of procedure as well as the alternatives and risks of each were explained to the (patient/caregiver).  Consent for procedure obtained. Time Out: Verified patient identification, verified procedure, site/side was marked, verified correct patient position, special equipment/implants available, medications/allergies/relevent history reviewed, required imaging and test results available.  Performed  Maximum sterile technique was used including gloves, hand hygiene and mask.  MAC and 4  Versed 2 fent 100 Etomidate 30 in divided doses Roc 50  Grade 2 view  Evaluation Hemodynamic Status: BP stable throughout; O2 sats: transiently fell during during procedure Patient's Current Condition: stable Complications: No apparent complications Patient did tolerate procedure well. Chest X-ray ordered to verify placement.  CXR: tube position acceptable.   Leanna Sato Elsworth Soho 11/20/2019

## 2019-11-20 NOTE — Progress Notes (Signed)
  NEUROSURGERY PROGRESS NOTE   Received call from nursing that patient's pupils are unequal in size, although sluggishly reactive. Daughter also arrived at bedside and requested to speak with someone. I was in the hospital and came up to see the patient and touch base with the daughter.   Patient remains intubated. Has propofol running. Right pupil 72mm, reactive, left 3mm reactive. EVD in place draining bloody CSF.   I updated the daughter at bedside regarding patient's current status, prognosis, plan going forward, etc. She states understanding. Will continue to monitor.  Ferne Reus, PA-C [facility] [employer]

## 2019-11-20 NOTE — Progress Notes (Signed)
Pt to 4N-27 from OR. Pt placed to unit monitor. VSS. Assessment as per flowsheet. Dr. Kathyrn Sheriff at bedside. VO received to place EVD to 10 mmHg and keep SBP <160. EVD open and draining. Will con't to monitor.

## 2019-11-20 NOTE — Procedures (Signed)
PREOP DX: Hydrocephalus  POSTOP DX: Same  PROCEDURE: Right frontal ventriculostomy   SURGEON: Dr. Emelda Brothers, MD  ANESTHESIA: Local with lidocaine  EBL: Minimal  SPECIMENS: None  COMPLICATIONS: None  CONDITION: Hemodynamically stable  INDICATIONS: Mrs. Rebecca Green is a 69 y.o. woman with aneurysmal SAH. She became obtunded this morning, stat CTH showed hydrocephalus, emergent R F EVD performed.  PROCEDURE IN DETAIL: After consent was obtained from the patient's family, skin of the right frontal scalp was clipped, prepped and draped in the usual sterile fashion.  Scalp was then infiltrated with local anesthetic with epinephrine.  Skin incision was made sharply, and twist drill burr hole was made.  The dura was then incised, and the ventricular catheter was passed in 1 attempt into the right lateral ventricle.  Good CSF flow was obtained under high pressure.  The catheter was then tunneled subcutaneously and connected to a drainage system and the skin incision closed.  The drain was then secured in place.  FINDINGS: 1. Opening pressure >15cm H2O 2. Clear CSF

## 2019-11-20 NOTE — Transfer of Care (Signed)
Immediate Anesthesia Transfer of Care Note  Patient: [place]  Procedure(s) Performed: IR WITH ANESTHESIA (N/A )  Patient Location: ICU  Anesthesia Type:General  Level of Consciousness: sedated and Patient remains intubated per anesthesia plan  Airway & Oxygen Therapy: Patient remains intubated per anesthesia plan and Patient placed on Ventilator (see vital sign flow sheet for setting)  Post-op Assessment: Report given to RN and Post -op Vital signs reviewed and stable abp 120/50 HR 50 spo2 100%  Post vital signs: Reviewed and stable  Last Vitals:  Vitals Value Taken Time  BP    Temp    Pulse 46 11/20/19 1624  Resp 15 11/20/19 1626  SpO2 100 % 11/20/19 1624  Vitals shown include unvalidated device data.  Last Pain:  Vitals:   11/20/19 0800  TempSrc: Axillary  PainSc:          Complications: No apparent anesthesia complications

## 2019-11-21 ENCOUNTER — Inpatient Hospital Stay (HOSPITAL_COMMUNITY): Payer: Medicare Other

## 2019-11-21 ENCOUNTER — Encounter (HOSPITAL_COMMUNITY): Payer: Self-pay

## 2019-11-21 DIAGNOSIS — J9601 Acute respiratory failure with hypoxia: Secondary | ICD-10-CM

## 2019-11-21 DIAGNOSIS — I609 Nontraumatic subarachnoid hemorrhage, unspecified: Secondary | ICD-10-CM

## 2019-11-21 DIAGNOSIS — I6012 Nontraumatic subarachnoid hemorrhage from left middle cerebral artery: Secondary | ICD-10-CM | POA: Diagnosis not present

## 2019-11-21 LAB — CBC
HCT: 27 % — ABNORMAL LOW (ref 36.0–46.0)
Hemoglobin: 8.6 g/dL — ABNORMAL LOW (ref 12.0–15.0)
MCH: 30.5 pg (ref 26.0–34.0)
MCHC: 31.9 g/dL (ref 30.0–36.0)
MCV: 95.7 fL (ref 80.0–100.0)
Platelets: 314 10*3/uL (ref 150–400)
RBC: 2.82 MIL/uL — ABNORMAL LOW (ref 3.87–5.11)
RDW: 13.7 % (ref 11.5–15.5)
WBC: 14 10*3/uL — ABNORMAL HIGH (ref 4.0–10.5)
nRBC: 0 % (ref 0.0–0.2)

## 2019-11-21 LAB — BASIC METABOLIC PANEL
Anion gap: 10 (ref 5–15)
BUN: 17 mg/dL (ref 8–23)
CO2: 16 mmol/L — ABNORMAL LOW (ref 22–32)
Calcium: 8.4 mg/dL — ABNORMAL LOW (ref 8.9–10.3)
Chloride: 115 mmol/L — ABNORMAL HIGH (ref 98–111)
Creatinine, Ser: 1.45 mg/dL — ABNORMAL HIGH (ref 0.44–1.00)
GFR calc Af Amer: 42 mL/min — ABNORMAL LOW (ref >60)
GFR calc non Af Amer: 36 mL/min — ABNORMAL LOW (ref >60)
Glucose, Bld: 298 mg/dL — ABNORMAL HIGH (ref 70–99)
Potassium: 4 mmol/L (ref 3.5–5.1)
Sodium: 141 mmol/L (ref 135–145)

## 2019-11-21 LAB — GLUCOSE, CAPILLARY
Glucose-Capillary: 352 mg/dL — ABNORMAL HIGH (ref 70–99)
Glucose-Capillary: 305 mg/dL — ABNORMAL HIGH (ref 70–99)
Glucose-Capillary: 212 mg/dL — ABNORMAL HIGH (ref 70–99)
Glucose-Capillary: 141 mg/dL — ABNORMAL HIGH (ref 70–99)
Glucose-Capillary: 128 mg/dL — ABNORMAL HIGH (ref 70–99)
Glucose-Capillary: 114 mg/dL — ABNORMAL HIGH (ref 70–99)
Glucose-Capillary: 119 mg/dL — ABNORMAL HIGH (ref 70–99)

## 2019-11-21 LAB — HEMOGLOBIN A1C
Hgb A1c MFr Bld: 7.8 % — ABNORMAL HIGH (ref 4.8–5.6)
Mean Plasma Glucose: 177.16 mg/dL

## 2019-11-21 LAB — TRIGLYCERIDES: Triglycerides: 113 mg/dL (ref <150)

## 2019-11-21 LAB — PHOSPHORUS: Phosphorus: 4.4 mg/dL (ref 2.5–4.6)

## 2019-11-21 LAB — MAGNESIUM: Magnesium: 1.6 mg/dL — ABNORMAL LOW (ref 1.7–2.4)

## 2019-11-21 MED ORDER — PRO-STAT SUGAR FREE PO LIQD
60.0000 mL | Freq: Two times a day (BID) | ORAL | Status: DC
  Administered 2019-11-21 – 2019-12-20 (×57): 60 mL
  Filled 2019-11-21 (×57): qty 60

## 2019-11-21 MED ORDER — INSULIN REGULAR(HUMAN) IN NACL 100-0.9 UT/100ML-% IV SOLN
INTRAVENOUS | Status: DC
  Administered 2019-11-21: 11.5 [IU]/h via INTRAVENOUS
  Filled 2019-11-21: qty 100

## 2019-11-21 MED ORDER — SODIUM CHLORIDE 0.9 % IV SOLN: INTRAVENOUS | Status: DC

## 2019-11-21 MED ORDER — INSULIN ASPART 100 UNIT/ML ~~LOC~~ SOLN
2.0000 [IU] | SUBCUTANEOUS | Status: DC
  Administered 2019-11-22 (×3): 4 [IU] via SUBCUTANEOUS
  Administered 2019-11-22: 6 [IU] via SUBCUTANEOUS
  Administered 2019-11-23: 4 [IU] via SUBCUTANEOUS
  Administered 2019-11-23: 6 [IU] via SUBCUTANEOUS
  Administered 2019-11-23 (×2): 4 [IU] via SUBCUTANEOUS
  Administered 2019-11-23 (×2): 2 [IU] via SUBCUTANEOUS
  Administered 2019-11-24 (×2): 4 [IU] via SUBCUTANEOUS
  Administered 2019-11-24: 6 [IU] via SUBCUTANEOUS
  Administered 2019-11-24: 4 [IU] via SUBCUTANEOUS
  Administered 2019-11-24: 2 [IU] via SUBCUTANEOUS
  Administered 2019-11-25 (×2): 4 [IU] via SUBCUTANEOUS

## 2019-11-21 MED ORDER — SODIUM CHLORIDE 0.9% FLUSH
10.0000 mL | Freq: Two times a day (BID) | INTRAVENOUS | Status: DC
  Administered 2019-11-21 – 2019-11-23 (×6): 10 mL
  Administered 2019-11-23: 20 mL
  Administered 2019-11-24 – 2019-11-26 (×5): 10 mL

## 2019-11-21 MED ORDER — FENTANYL CITRATE (PF) 100 MCG/2ML IJ SOLN: 25.0000 ug | Freq: Once | INTRAMUSCULAR | Status: DC

## 2019-11-21 MED ORDER — VITAL HIGH PROTEIN PO LIQD
1000.0000 mL | ORAL | Status: DC
  Administered 2019-11-22 – 2019-11-23 (×2): 1000 mL

## 2019-11-21 MED ORDER — FENTANYL BOLUS VIA INFUSION
25.0000 ug | INTRAVENOUS | Status: DC | PRN
  Administered 2019-11-21: 25 ug via INTRAVENOUS
  Filled 2019-11-21: qty 25

## 2019-11-21 MED ORDER — CHLORHEXIDINE GLUCONATE CLOTH 2 % EX PADS: 6.0000 | MEDICATED_PAD | Freq: Every day | CUTANEOUS | Status: DC

## 2019-11-21 MED ORDER — INSULIN ASPART 100 UNIT/ML ~~LOC~~ SOLN
0.0000 [IU] | SUBCUTANEOUS | Status: DC
  Administered 2019-11-21: 9 [IU] via SUBCUTANEOUS
  Administered 2019-11-21: 7 [IU] via SUBCUTANEOUS

## 2019-11-21 MED ORDER — DEXTROSE 10 % IV SOLN: INTRAVENOUS | Status: DC | PRN

## 2019-11-21 MED ORDER — ADULT MULTIVITAMIN W/MINERALS CH
1.0000 | ORAL_TABLET | Freq: Every day | ORAL | Status: DC
  Administered 2019-11-21 – 2019-12-29 (×38): 1
  Filled 2019-11-21 (×38): qty 1

## 2019-11-21 MED ORDER — SODIUM CHLORIDE 0.9% FLUSH: 10.0000 mL | INTRAVENOUS | Status: DC | PRN

## 2019-11-21 MED ORDER — INSULIN ASPART 100 UNIT/ML ~~LOC~~ SOLN
1.0000 [IU] | SUBCUTANEOUS | Status: DC
  Administered 2019-11-22 (×3): 1 [IU] via SUBCUTANEOUS

## 2019-11-21 MED ORDER — INSULIN DETEMIR 100 UNIT/ML ~~LOC~~ SOLN
5.0000 [IU] | Freq: Two times a day (BID) | SUBCUTANEOUS | Status: DC
  Administered 2019-11-21 – 2019-11-25 (×8): 5 [IU] via SUBCUTANEOUS
  Filled 2019-11-21 (×10): qty 0.05

## 2019-11-21 MED ORDER — DEXTROSE 50 % IV SOLN: 0.0000 mL | INTRAVENOUS | Status: DC | PRN

## 2019-11-21 MED ORDER — MAGNESIUM SULFATE 2 GM/50ML IV SOLN
2.0000 g | Freq: Once | INTRAVENOUS | Status: AC
  Administered 2019-11-21: 2 g via INTRAVENOUS
  Filled 2019-11-21: qty 50

## 2019-11-21 MED ORDER — FENTANYL 2500MCG IN NS 250ML (10MCG/ML) PREMIX INFUSION
25.0000 ug/h | INTRAVENOUS | Status: DC
  Administered 2019-11-21: 25 ug/h via INTRAVENOUS
  Filled 2019-11-21: qty 250

## 2019-11-21 MED ORDER — DEXTROSE-NACL 5-0.45 % IV SOLN: INTRAVENOUS | Status: DC

## 2019-11-21 MED FILL — Thrombin For Soln 5000 Unit: CUTANEOUS | Qty: 5000 | Status: AC

## 2019-11-21 NOTE — Progress Notes (Signed)
Blood glucose remain elevated despite ssi.   Increasing tf rate. Will start insulin infusion for now and get better estimate of insulin need for basal dosing, etc.

## 2019-11-21 NOTE — Progress Notes (Addendum)
NAME:  Warden, MRN:  [id number], DOB:  15-Sep-1950, LOS: 2 ADMISSION DATE:  11/19/2019, CONSULTATION DATE:  11/20/19 REFERRING MD:  Zada Finders, CHIEF COMPLAINT:  SAH    Brief History   69 year old female with PMH significant for seizures, previous CVA's, HTN, and known left MCA aneurysm who reports for sudden unresponsiveness, left-sided facial droop, and right upper extremity weakness. CT Head revealed large volume left sided SAH with 93mm midline shift.   History of present illness   69 year old female with a PMH significant for seizure disorders, previous CVA's, and HTN.  LKWT approximately around noon on 6/6. Her niece reports that she slumped over and became unresponsive. This was associated with left-sided facial droop and right upper extremity weakness. Reportedly, she had a known left MCA aneurysm that was being monitored. CT head revealed large volume subarachnoid hemorrhage overlying the left cerebral hemisphere with 17mm left to right midline shift. Overnight, she acutely decompensated and required placement of an right frontal ventriculostomy by NSG. PCCM was consulted for concern for airway protection for which she was intubated.   Past Medical History  CVA (No residual deficits) HTN Seizures DM II   Significant Hospital Events   6/6 Admitted to [place] 6/7 Acute decompensation - right frontal EVD placed. Intubated for airway protection  Consults:  PCCM Neurology  Procedures:  6/7 EVD >> 6/7 ETT >>  6/7 Left pterional craniotomy for clipping of MCA aneurysm  Significant Diagnostic Tests:  6/7 CT Head >> Large volume SAH overlying left cerebral hemisphere with  left to right midline shift. New hydrocephalus of lateral and third vents. SAH reflux into occipital horns of lateral vents.   6/6 CTA Neck >> Patent common and internal carotids without significant stenosis. Mixed plaque within proximal left ICA. Patent vertebrals.   6/6 CTA Head >> 3x2 mm left MCA  bifurcation aneurysm (increased in size from April 2020). No intracranial LVO. High grade focal stenosis within inferior division mid M2 left MCA  Micro Data:  6/6 SARS CoV2 >> Negative  Antimicrobials:  6/7 Rocephin >>   Interim history/subjective:  Admitted 6/6 for large volume SAH overlying left cerebral hemisphere with associated left to right midline shift, thought to be from previously known left MCA aneurysm.   NSG emergently placed right frontal ventriculostomy w/ EVD yesterday and clipped MCA aneurysm. EVD remains in place draining serosanguinous fluid.   Patient was started on rocephin 6/7 and tracheal aspirates were collected after aspiration pneumonia was suspected.  There were no acute events overnight. The patient remains sedated on propofol and is meeting BP goals.   Patient remains intubated and mechanically ventilated.   Objective   Blood pressure (!) 142/60, pulse 99, temperature 99.5 F (37.5 C), temperature source Axillary, resp. rate 20, height 5\' 6"  (1.676 m), weight 77.6 kg, SpO2 100 %.    Vent Mode: PRVC FiO2 (%):  [60 %-100 %] 60 % Set Rate:  [12 bmp-15 bmp] 15 bmp Vt Set:  [470 mL] 470 mL PEEP:  [5 cmH20] 5 cmH20 Plateau Pressure:  [16 cmH20-21 cmH20] 16 cmH20   Intake/Output Summary (Last 24 hours) at 11/21/2019 0803 Last data filed at 11/21/2019 0700 Gross per 24 hour  Intake 3393.17 ml  Output 1937 ml  Net 1456.17 ml   Filed Weights   11/19/19 1529 11/21/19 0500  Weight: 77.8 kg 77.6 kg    Examination: General: Elderly female laying in bed with eyes closed. No obvious distress HENT: Non-icteric  sclera. MMM. Right sided facial droop. Gurgling from oropharyngeal cavity. Lungs: Faint crackles in R lower lung. No accessory muscle use. No signs of respiratory distress. Cardiovascular: Normal S1/S2. Sinus rhythm on monitor. No JVD. No murmurs or rubs. Abdomen: Soft, non-distended, and non-tender to palpation Extremities: No peripheral edema. Radial  and DP pulses 2+. Normal muscle tone and bulk without joint deformities Neuro: Does not follow commands. Opens eyes to voice. Withdraws to pain in extremities. R pupil 8mm reactive, left pupil 64mm reactive.  Purposeful RUE movement GU: Deferred  Resolved Hospital Problem list     Assessment & Plan:  Acute respiratory failure secondary to encephalopathy Intubated 6/7 for airway protection. Concern for possible aspiration pneumonia given physical exam findings and increased WBC. CXR does not show any focal opacities.  Plan: -Minimal vent settings, wean as tolerated -ok for ps trials despite mental status precluding extubation -Peak pressures < 30 cmH20 -VAP prevention bundle -attempt to transition to fentanyl gtt from propofol or at least to minimize.  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 Left cerebral hemisphere SAH with right to left midline shift. Right frontal ventriculostomy at 10 cmH20 placed by NSG 6/7 for obtundation and new hydrocephalus. S/p aneurysm clipping 6/7.  Plan: -Management per NSG.  -Neuro check q1h -Nimodipine for vasospasm prevention -remains on cleviprex for SBP <160 -keppra ongoing - EVD in place -Fever control with APAP to prevent secondary injury - TCDs today, then MWF per NSG   Suspected AKI  Versus CKD. Creatinine 1.45 today, unable to access previous renal function labs.  Plan: -stopping IVF 2/2 BP and will follow trend -Continue to monitor and trend renal indices.  -Avoid nephrotoxic drugs  Hypertension, Hydralazine 50mg  TID and amlodipine 10mg  daily at home. Currently on clevidipine drip with good BP control.  Plan: -SBP goal < 140  -suspect can resume hydralazine tomorrow  History of seizures Takes Keppra 500mg  BID at home. No seizure activity noted during admission  Plan:  -Continue Keppra 500mg  BID  IV -Seizure precautions  DM II with hyperglycemia No home medications  Plan: -a1c 7.8 -goal BG <180 mg/dL  -adding ssi today   Hypomagnesemia Repleated  - recheck tomorrow  Best practice:  Diet: TF Pain/Anxiety/Delirium protocol (if indicated): fentanyl and propofol (attempt to wean propofol) VAP protocol (if indicated): Yes DVT prophylaxis: Mechanical  GI prophylaxis: PPI Glucose control: < 180mg /dL.  SSI Mobility: BR Code Status: FULL Family Communication: per primary Disposition: Neuro ICU  Labs   CBC: Recent Labs  Lab 11/19/19 1459 11/19/19 1507 11/20/19 1128 11/20/19 1246 11/20/19 1409 11/20/19 1507 11/21/19 0518  WBC 5.6  --   --   --   --   --  14.0*  NEUTROABS 3.2  --   --   --   --   --   --   HGB 10.9*   < > 8.8* 9.2* 7.8* 8.5* 8.6*  HCT 33.5*   < > 26.0* 27.0* 23.0* 25.0* 27.0*  MCV 93.8  --   --   --   --   --  95.7  PLT 388  --   --   --   --   --  314   < > = values in this interval not displayed.    Basic Metabolic Panel: Recent Labs  Lab 11/19/19 1459 11/19/19 1507 11/20/19 0811 11/20/19 0935 11/20/19 1128 11/20/19 1246 11/20/19  1409 11/20/19 1507 11/21/19 0518  NA 139   < > 140   < > 144 145 142 142 141  K 4.2   < > 4.2   < > 4.1 4.1 3.6 3.9 4.0  CL 110   < > 111  --   --  110 108 111 115*  CO2 20*  --  20*  --   --   --   --   --  16*  GLUCOSE 188*   < > 255*  --   --  206* 120* 85 298*  BUN 13   < > 14  --   --  14 14 13 17   CREATININE 1.25*   < > 1.23*  --   --  1.10* 1.10* 1.30* 1.45*  CALCIUM 8.9  --  9.0  --   --   --   --   --  8.4*  MG  --   --   --   --   --   --   --   --  1.6*  PHOS  --   --   --   --   --   --   --   --  4.4   < > = values in this interval not displayed.   GFR: Estimated Creatinine Clearance: 38 mL/min (A) (by C-G formula based on SCr of 1.45 mg/dL (H)). Recent Labs  Lab 11/19/19 1459 11/21/19 0518  WBC 5.6 14.0*    Liver Function Tests: Recent Labs  Lab 11/19/19 1459  AST 12*  ALT 7   ALKPHOS 99  BILITOT 0.6  PROT 6.4*  ALBUMIN 3.0*   No results for input(s): LIPASE, AMYLASE in the last 168 hours. No results for input(s): AMMONIA in the last 168 hours.  ABG    Component Value Date/Time   PHART 7.352 11/20/2019 1128   PCO2ART 38.8 11/20/2019 1128   PO2ART 164 (H) 11/20/2019 1128   HCO3 21.6 11/20/2019 1128   TCO2 19 (L) 11/20/2019 1507   ACIDBASEDEF 4.0 (H) 11/20/2019 1128   O2SAT 99.0 11/20/2019 1128     Coagulation Profile: Recent Labs  Lab 11/19/19 1459  INR 1.0    Cardiac Enzymes: No results for input(s): CKTOTAL, CKMB, CKMBINDEX, TROPONINI in the last 168 hours.  HbA1C: No results found for: HGBA1C  CBG: Recent Labs  Lab 11/19/19 1454 11/20/19 1702  GLUCAP 169* 120*       Critical care time: The patient is critically ill with multiple organ systems failure and requires high complexity decision making for assessment and support, frequent evaluation and titration of therapies, application of advanced monitoring technologies and extensive interpretation of multiple databases.  Critical care time 37 mins. This represents my time independent of the medical student, resident, NPs time taking care of the pt. This is excluding procedures.    [facility] 11/21/2019, 3:38 PM

## 2019-11-21 NOTE — Progress Notes (Signed)
Inpatient Diabetes Program Recommendations  AACE/ADA: New Consensus Statement on Inpatient Glycemic Control (2015)  Target Ranges:  Prepandial:   less than 140 mg/dL      Peak postprandial:   less than 180 mg/dL (1-2 hours)      Critically ill patients:  140 - 180 mg/dL   Lab Results  Component Value Date   GLUCAP 352 (H) 11/21/2019   HGBA1C 7.8 (H) 11/21/2019    Review of Glycemic Control Results for Rebecca Green, Rebecca Green (MRN [id number]) as of 11/21/2019 13:35  Ref. Range 11/20/2019 17:02 11/21/2019 12:29  Glucose-Capillary Latest Ref Range: 70 - 99 mg/dL 120 (H) 352 (H)   Diabetes history: None noted Outpatient Diabetes medications: None Current orders for Inpatient glycemic control:  Novolog sensitive q 4 hours  Inpatient Diabetes Program Recommendations:    Note marked hyperglycemia.  A1C indicates possible new diagnosis of DM?   Agree with the addition of q 4 hour coverage.  If blood sugars remain>200 mg/dL, may consider IV insulin (ICU hyperglycemia order set) while in the ICU setting?   Thanks  Adah Perl, RN, BC-ADM Inpatient Diabetes Coordinator Pager [phone] (8a-5p)

## 2019-11-21 NOTE — Progress Notes (Addendum)
Transcranial Doppler  Date POD PCO2 HCT BP  MCA ACA PCA OPHT SIPH VERT Basilar  6/8 JP     Right  Left   51  *   -44  *   23  *   23  25   38  43   *  *   *           Right  Left                                            Right  Left                                             Right  Left                                             Right  Left                                            Right  Left                                            Right  Left                                        MCA = Middle Cerebral Artery      OPHT = Opthalmic Artery     BASILAR = Basilar Artery   ACA = Anterior Cerebral Artery     SIPH = Carotid Siphon PCA = Posterior Cerebral Artery   VERT = Verterbral Artery                   Normal MCA = 62+\-12 ACA = 50+\-12 PCA = 42+\-23    * = not insonated. June Leap, BS, RDMS, RVT

## 2019-11-21 NOTE — Progress Notes (Signed)
Initial Nutrition Assessment  DOCUMENTATION CODES:   Not applicable  INTERVENTION:   Tube feeding via OG tube: Increase Vital High Protein to 30 ml/h (720 ml per day) Pro-stat 60 ml BID  Provides 1120 kcal, 123 gm protein, 601 ml free water daily  TF regimen cleviprex and propofol at current rate providing 1993 total kcal/day    NUTRITION DIAGNOSIS:   Inadequate oral intake related to inability to eat as evidenced by NPO status.  GOAL:   Provide needs based on ASPEN/SCCM guidelines  MONITOR:   TF tolerance, Vent status, Labs  REASON FOR ASSESSMENT:   Consult, Ventilator Enteral/tube feeding initiation and management  ASSESSMENT:   Pt with PMH of seizures, previous CVA's, HTN, DM, and known L MCA aneurysm who was admitted with large L SAH from ruptured L MCA aneurysm.   Pt discussed during ICU rounds and with RN.   6/7 s/p crani for clipping and EVD placement   Patient is currently intubated on ventilator support MV: 7.9 L/min Temp (24hrs), Avg:98.9 F (37.2 C), Min:97.5 F (36.4 C), Max:100.4 F (38 C)  Propofol: 4 ml/hr provides: 105 kcal  Cleviprex @ 16 ml/hr provides: 768 kcal  Medications reviewed and include: colace, SSI, miralax, senokot Labs reviewed: hgbA1C: 7.8 (H) CBG's: 352  EVD: 92 ml   TF: Vital High Protein @ 20 ml/hr   Diet Order:   Diet Order            Diet NPO time specified  Diet effective now              EDUCATION NEEDS:   No education needs have been identified at this time  Skin:  Skin Assessment: Reviewed RN Assessment  Last BM:  unknown  Height:   Ht Readings from Last 1 Encounters:  11/19/19 5\' 6"  (1.676 m)    Weight:   Wt Readings from Last 1 Encounters:  11/21/19 77.6 kg    Ideal Body Weight:  59 kg  BMI:  Body mass index is 27.61 kg/m.  Estimated Nutritional Needs:   Kcal:  8485  Protein:  110-125 grams  Fluid:  2 L/day  Lockie Pares., RD, LDN, CNSC See AMiON for contact information

## 2019-11-21 NOTE — Progress Notes (Signed)
  NEUROSURGERY PROGRESS NOTE   No issues overnight.  EXAM:  BP (!) 142/60   Pulse 99   Temp 99.5 F (37.5 C) (Axillary)   Resp 20   Ht 5\' 6"  (1.676 m)   Wt 77.6 kg   SpO2 100%   BMI 27.61 kg/m   Intubated Opens eyes to voice Right pupil 68mm reactive, left pupil 24mm reactive Does not follow commands Purposeful for RUE Bandage in place  IMPRESSION/PLAN 69 y.o. female POD1 craniotomy for clipping MCA bifurcation aneurysm.  Some improvement neurologically. - Continue supportive care, nimotop - TCDs today then MWF

## 2019-11-22 ENCOUNTER — Inpatient Hospital Stay (HOSPITAL_COMMUNITY): Payer: Medicare Other

## 2019-11-22 DIAGNOSIS — I609 Nontraumatic subarachnoid hemorrhage, unspecified: Secondary | ICD-10-CM | POA: Diagnosis not present

## 2019-11-22 DIAGNOSIS — I6012 Nontraumatic subarachnoid hemorrhage from left middle cerebral artery: Secondary | ICD-10-CM | POA: Diagnosis not present

## 2019-11-22 LAB — GLUCOSE, CAPILLARY
Glucose-Capillary: 150 mg/dL — ABNORMAL HIGH (ref 70–99)
Glucose-Capillary: 152 mg/dL — ABNORMAL HIGH (ref 70–99)
Glucose-Capillary: 160 mg/dL — ABNORMAL HIGH (ref 70–99)
Glucose-Capillary: 172 mg/dL — ABNORMAL HIGH (ref 70–99)
Glucose-Capillary: 183 mg/dL — ABNORMAL HIGH (ref 70–99)
Glucose-Capillary: 188 mg/dL — ABNORMAL HIGH (ref 70–99)
Glucose-Capillary: 190 mg/dL — ABNORMAL HIGH (ref 70–99)
Glucose-Capillary: 222 mg/dL — ABNORMAL HIGH (ref 70–99)
Glucose-Capillary: 305 mg/dL — ABNORMAL HIGH (ref 70–99)
Glucose-Capillary: 97 mg/dL (ref 70–99)

## 2019-11-22 LAB — COMPREHENSIVE METABOLIC PANEL
ALT: 11 U/L (ref 0–44)
AST: 15 U/L (ref 15–41)
Albumin: 2.4 g/dL — ABNORMAL LOW (ref 3.5–5.0)
Alkaline Phosphatase: 85 U/L (ref 38–126)
Anion gap: 9 (ref 5–15)
BUN: 32 mg/dL — ABNORMAL HIGH (ref 8–23)
CO2: 16 mmol/L — ABNORMAL LOW (ref 22–32)
Calcium: 8.7 mg/dL — ABNORMAL LOW (ref 8.9–10.3)
Chloride: 117 mmol/L — ABNORMAL HIGH (ref 98–111)
Creatinine, Ser: 1.5 mg/dL — ABNORMAL HIGH (ref 0.44–1.00)
GFR calc Af Amer: 40 mL/min — ABNORMAL LOW (ref >60)
GFR calc non Af Amer: 35 mL/min — ABNORMAL LOW (ref >60)
Glucose, Bld: 230 mg/dL — ABNORMAL HIGH (ref 70–99)
Potassium: 3.8 mmol/L (ref 3.5–5.1)
Sodium: 142 mmol/L (ref 135–145)
Total Bilirubin: 0.4 mg/dL (ref 0.3–1.2)
Total Protein: 5.8 g/dL — ABNORMAL LOW (ref 6.5–8.1)

## 2019-11-22 LAB — PROTEIN / CREATININE RATIO, URINE
Creatinine, Urine: 94.5 mg/dL
Protein Creatinine Ratio: 0.67 mg/mg{Cre} — ABNORMAL HIGH (ref 0.00–0.15)
Total Protein, Urine: 63 mg/dL

## 2019-11-22 LAB — TRIGLYCERIDES: Triglycerides: 203 mg/dL — ABNORMAL HIGH (ref <150)

## 2019-11-22 LAB — CBC
HCT: 25.2 % — ABNORMAL LOW (ref 36.0–46.0)
Hemoglobin: 8.1 g/dL — ABNORMAL LOW (ref 12.0–15.0)
MCH: 31 pg (ref 26.0–34.0)
MCHC: 32.1 g/dL (ref 30.0–36.0)
MCV: 96.6 fL (ref 80.0–100.0)
Platelets: 299 10*3/uL (ref 150–400)
RBC: 2.61 MIL/uL — ABNORMAL LOW (ref 3.87–5.11)
RDW: 14.3 % (ref 11.5–15.5)
WBC: 14.9 10*3/uL — ABNORMAL HIGH (ref 4.0–10.5)
nRBC: 0 % (ref 0.0–0.2)

## 2019-11-22 LAB — MAGNESIUM: Magnesium: 2.2 mg/dL (ref 1.7–2.4)

## 2019-11-22 LAB — SODIUM, URINE, RANDOM: Sodium, Ur: 36 mmol/L

## 2019-11-22 MED ORDER — HYDRALAZINE HCL 50 MG PO TABS: 50.0000 mg | ORAL_TABLET | Freq: Three times a day (TID) | ORAL | Status: DC

## 2019-11-22 MED ORDER — INSULIN ASPART 100 UNIT/ML ~~LOC~~ SOLN
3.0000 [IU] | SUBCUTANEOUS | Status: DC
  Administered 2019-11-22 – 2019-11-25 (×16): 3 [IU] via SUBCUTANEOUS

## 2019-11-22 MED ORDER — CHLORHEXIDINE GLUCONATE CLOTH 2 % EX PADS
6.0000 | MEDICATED_PAD | Freq: Every day | CUTANEOUS | Status: DC
  Administered 2019-11-23 – 2019-12-29 (×29): 6 via TOPICAL

## 2019-11-22 MED ORDER — AMLODIPINE BESYLATE 10 MG PO TABS
10.0000 mg | ORAL_TABLET | Freq: Every day | ORAL | Status: DC
  Administered 2019-11-22 – 2019-11-30 (×9): 10 mg
  Filled 2019-11-22 (×10): qty 1

## 2019-11-22 MED ORDER — AMLODIPINE BESYLATE 10 MG PO TABS: 10.0000 mg | ORAL_TABLET | Freq: Every day | ORAL | Status: DC

## 2019-11-22 MED ORDER — HYDRALAZINE HCL 50 MG PO TABS
50.0000 mg | ORAL_TABLET | Freq: Three times a day (TID) | ORAL | Status: DC
  Administered 2019-11-22 (×3): 50 mg
  Filled 2019-11-22 (×4): qty 1

## 2019-11-22 NOTE — Plan of Care (Signed)
Pt arouses to voice unable to follow commands at this time. VSS, SBP within goal (SBP goal<160)

## 2019-11-22 NOTE — Progress Notes (Signed)
Verified EVD level with Vinny Costella from Neurosurgery this morning that the EVD is to be at 54mmHg

## 2019-11-22 NOTE — Progress Notes (Addendum)
NAME:  [place], MRN:  [id number], DOB:  10/12/1950, LOS: 3 ADMISSION DATE:  11/19/2019, CONSULTATION DATE:  11/20/19 REFERRING MD:  Zada Finders, CHIEF COMPLAINT:  SAH    Brief History   69 year old female with PMH significant for seizures, previous CVA's, HTN, and known left MCA aneurysm who reports for sudden unresponsiveness, left-sided facial droop, and right upper extremity weakness. CT Head revealed large volume left sided SAH with 28m midline shift.   History of present illness   69year old female with a PMH significant for seizure disorders, previous CVA's, and HTN.  LKWT approximately around noon on 6/6. Her niece reports that she slumped over and became unresponsive. This was associated with left-sided facial droop and right upper extremity weakness. Reportedly, she had a known left MCA aneurysm that was being monitored. CT head revealed large volume subarachnoid hemorrhage overlying the left cerebral hemisphere with 374mleft to right midline shift. Overnight, she acutely decompensated and required placement of an right frontal ventriculostomy by NSG. PCCM was consulted for concern for airway protection for which she was intubated.   Past Medical History  CVA (No residual deficits) HTN Seizures DM II   Significant Hospital Events   6/6 [facility] Acute decompensation - right frontal EVD placed. Intubated for airway protection  Consults:  PCCM Neurology Neurosurgery  Procedures:  6/7 EVD >> 6/7 ETT >>  6/7 Left pterional craniotomy for clipping of MCA aneurysm  Significant Diagnostic Tests:  6/7 CT Head >> Large volume SAH overlying left cerebral hemisphere with  left to right midline shift. New hydrocephalus of lateral and third vents. SAH reflux into occipital horns of lateral vents.   6/6 CTA Neck >> Patent common and internal carotids without significant stenosis. Mixed plaque within proximal left ICA. Patent vertebrals.   6/6 CTA Head >> 3x2 mm  left MCA bifurcation aneurysm (increased in size from April 2020). No intracranial LVO. High grade focal stenosis within inferior division mid M2 left MCA  Micro Data:  6/6 SARS CoV2 >> Negative 6/8 resp: gpc  Antimicrobials:  6/7 Rocephin >>   Interim history/subjective:  No acute events overnight. BP under adequate control with current agents. A few low MAPs in the low 60s over night.   Blood sugar remains difficult to control, now on continuous insulin drip.   Patient appears more responsive today on exam.   EVD remains in place at 10, patient remains sedated on full mechanical ventilation.     Objective   Blood pressure (!) 125/51, pulse 75, temperature (!) 100.5 F (38.1 C), temperature source Axillary, resp. rate 16, height _0  (1.676 m), weight 78.4 kg, SpO2 100 %.    Vent Mode: PRVC FiO2 (%):  [40 %-50 %] 40 % Set Rate:  [15 bmp] 15 bmp Vt Set:  [470 mL] 470 mL PEEP:  [5 cmH20] 5 cmH20 Plateau Pressure:  [10 cmH20-13 cmH20] 11 cmH20   Intake/Output Summary (Last 24 hours) at 11/22/2019 0818 Last data filed at 11/22/2019 0700 Gross per 24 hour  Intake 2641.39 ml  Output 1185 ml  Net 1456.39 ml   Filed Weights   11/19/19 1529 11/21/19 0500 11/22/19 0500  Weight: 77.8 kg 77.6 kg 78.4 kg    Examination: General: Elderly female laying in bed with eyes closed. No obvious distress HENT: Non-icteric sclera. MMM. Right sided facial droop. Gurgling from oropharyngeal cavity. Lungs: Faint crackles in R lower lung. No accessory muscle use. No signs of  respiratory distress. Cardiovascular: Normal S1/S2. Sinus rhythm on monitor. No JVD. No murmurs or rubs. Abdomen: Soft, non-distended, and non-tender to palpation Extremities: No peripheral edema. Radial and DP pulses 2+. Normal muscle tone and bulk without joint deformities Neuro: Does not follow commands. Opens eyes to voice. Withdraws to pain in extremities. R pupil 51m reactive, left pupil 422mreactive.  Purposeful RUE  movement GU: Deferred  Resolved Hospital Problem list     Assessment & Plan:  Acute respiratory failure secondary to encephalopathy Intubated 6/7 for airway protection. CXR does not show any focal opacities. Resp cx showing few PMNs and GPC in chains pending speciation. Leukocytosis increasing to 14.9 today, but possibly reactive leukocytosis in the setting of steroid administration. Patient had slight fever to 100.5 this morning. Will continue antibiotics for now.  Plan: -Minimal vent settings, wean as tolerated, SBT today -ok for ps trials despite mental status precluding extubation -Peak pressures < 30 cmH20 -VAP prevention bundle -attempt to transition to fentanyl gtt from propofol or at least to minimize.  -F/u Resp cultures.. 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 Left cerebral hemisphere SAH with right to left midline shift. Right frontal ventriculostomy at 10 cmH20 placed by NSG 6/7 for obtundation and new hydrocephalus. S/p aneurysm clipping 6/7.  Plan: -Management per NSG.  -Neuro check q1h -Nimodipine for vasospasm prevention -remains on cleviprex for SBP <160 -resume home anti htn meds -keppra ongoing - EVD in place -Fever control with APAP to prevent secondary injury - TCDs MWF per NSG   Suspected AKI  Versus CKD. Creatinine 1.5 today, unable to access previous renal function labs. BUN also up to 32. eGFR ~ 40. Plan: -stopping IVF 2/2 BP and will follow trend -Continue to monitor and trend renal indices.  -Avoid nephrotoxic drugs -Check FENa, Renal [place]   Hypertension, Hydralazine 5076mID and amlodipine 21m95mily at home. Currently on clevidipine drip with good BP control. Plan: -SBP goal < 140  -Restart hydralazine 50mg46m and amlodipine 21mg 3my today  - wean clevidipine   History of seizures Takes Keppra  500mg B37mt home. No seizure activity noted during admission Plan:  -Continue Keppra 500mg BI3m -Seizure precautions  DM II with hyperglycemia No home medications. Several high blood sugars yesterday despite SSI. Patient was given steroids 6/7. We started continuous insulin drip yesterday afternoon to gain better control of blood sugars.  Plan: -a1c 7.8 -goal BG <180 mg/dL  -insulin infusion started yesterday 6/8 but was stopped overnight but unclear/undocumented reason. Will closely monitor today and may need reinitiating.   Hypomagnesemia (resolved) Repleated yesterday. 2.2 today  Best practice:  Diet: TF Pain/Anxiety/Delirium protocol (if indicated): fentanyl and propofol (attempt to wean propofol) VAP protocol (if indicated): Yes DVT prophylaxis: Mechanical  GI prophylaxis: PPI Glucose control: < 180mg/dL.57mobility: BR Code Status: FULL Family Communication: per primary Disposition: Neuro ICU  Labs   CBC: Recent Labs  Lab 11/19/19 1459 11/19/19 1507 11/20/19 1246 11/20/19 1409 11/20/19 1507 11/21/19 0518 11/22/19 0429  WBC 5.6  --   --   --   --  14.0* 14.9*  NEUTROABS 3.2  --   --   --   --   --   --   HGB 10.9*   < > 9.2* 7.8* 8.5* 8.6* 8.1*  HCT 33.5*   < > 27.0* 23.0* 25.0* 27.0* 25.2*  MCV 93.8  --   --   --   --  95.7 96.6  PLT 388  --   --   --   --  314 299   < > = values in this interval not displayed.    Basic Metabolic Panel: Recent Labs  Lab 11/19/19 1459 11/19/19 1507 11/20/19 0811 11/20/19 0935 11/20/19 1246 11/20/19 1409 11/20/19 1507 11/21/19 0518 11/22/19 0429  NA 139   < > 140   < > 145 142 142 141 142  K 4.2   < > 4.2   < > 4.1 3.6 3.9 4.0 3.8  CL 110   < > 111   < > 110 108 111 115* 117*  CO2 20*  --  20*  --   --   --   --  16* 16*  GLUCOSE 188*   < > 255*   < > 206* 120* 85 298* 230*  BUN 13   < > 14   < > _0 32*  CREATININE 1.25*   < > 1.23*   < > 1.10* 1.10* 1.30* 1.45* 1.50*  CALCIUM 8.9  --  9.0  --   --    --   --  8.4* 8.7*  MG  --   --   --   --   --   --   --  1.6* 2.2  PHOS  --   --   --   --   --   --   --  4.4  --    < > = values in this interval not displayed.   GFR: Estimated Creatinine Clearance: 36.9 mL/min (A) (by C-G formula based on SCr of 1.5 mg/dL (H)). Recent Labs  Lab 11/19/19 1459 11/21/19 0518 11/22/19 0429  WBC 5.6 14.0* 14.9*    Liver Function Tests: Recent Labs  Lab 11/19/19 1459 11/22/19 0429  AST 12* 15  ALT 7 11  ALKPHOS 99 85  BILITOT 0.6 0.4  PROT 6.4* 5.8*  ALBUMIN 3.0* 2.4*   No results for input(s): LIPASE, AMYLASE in the last 168 hours. No results for input(s): AMMONIA in the last 168 hours.  ABG    Component Value Date/Time   PHART 7.352 11/20/2019 1128   PCO2ART 38.8 11/20/2019 1128   PO2ART 164 (H) 11/20/2019 1128   HCO3 21.6 11/20/2019 1128   TCO2 19 (L) 11/20/2019 1507   ACIDBASEDEF 4.0 (H) 11/20/2019 1128   O2SAT 99.0 11/20/2019 1128     Coagulation Profile: Recent Labs  Lab 11/19/19 1459  INR 1.0    Cardiac Enzymes: No results for input(s): CKTOTAL, CKMB, CKMBINDEX, TROPONINI in the last 168 hours.  HbA1C: Hgb A1c MFr Bld  Date/Time Value Ref Range Status  11/21/2019 10:55 AM 7.8 (H) 4.8 - 5.6 % Final    Comment:    (NOTE) Pre diabetes:          5.7%-6.4% Diabetes:              >6.4% Glycemic control for   <7.0% adults with diabetes     CBG: Recent Labs  Lab 11/21/19 2057 11/21/19 2156 11/21/19 2258 11/22/19 0001 11/22/19 0348  GLUCAP 114* 119* 150* 160* 188*      Critical care time: The patient is critically ill with multiple organ systems failure and requires high complexity decision making for assessment and support, frequent evaluation and titration of therapies, application of advanced monitoring technologies and extensive interpretation of multiple databases.  Critical care time 34 mins. This represents my time independent of the NPs time taking care of the pt. This is excluding procedures.     Audria Nine DO [facility] 11/22/2019, 10:35 AM

## 2019-11-22 NOTE — Progress Notes (Signed)
Transcranial Doppler  Date POD PCO2 HCT BP  MCA ACA PCA OPHT SIPH VERT Basilar  6/8 JP     Right  Left   51  *   -44  *   23  *   23  25   38  43   *  *   *      6/9 GC     Right  Left   41  *   *  *   26  *   17  17   34  51   *  *   *           Right  Left                                             Right  Left                                             Right  Left                                            Right  Left                                            Right  Left                                        MCA = Middle Cerebral Artery      OPHT = Opthalmic Artery     BASILAR = Basilar Artery   ACA = Anterior Cerebral Artery     SIPH = Carotid Siphon PCA = Posterior Cerebral Artery   VERT = Verterbral Artery                   Normal MCA = 62+\-12 ACA = 50+\-12 PCA = 42+\-23   * = not insonated. 11/22/19 3:20 PM Rebecca Green RVT

## 2019-11-22 NOTE — Progress Notes (Signed)
Inpatient Diabetes Program Recommendations  AACE/ADA: New Consensus Statement on Inpatient Glycemic Control (2015)  Target Ranges:  Prepandial:   less than 140 mg/dL      Peak postprandial:   less than 180 mg/dL (1-2 hours)      Critically ill patients:  140 - 180 mg/dL   Lab Results  Component Value Date   GLUCAP 222 (H) 11/22/2019   HGBA1C 7.8 (H) 11/21/2019    Review of Glycemic Control Results for Rebecca Green, Rebecca Green (MRN [id number]) as of 11/22/2019 12:53  Ref. Range 11/22/2019 00:59 11/22/2019 03:48 11/22/2019 08:13 11/22/2019 11:31  Glucose-Capillary Latest Ref Range: 70 - 99 mg/dL 172 (H) 188 (H) 190 (H) 222 (H)   Diabetes history: None- ? New diagnosis Outpatient Diabetes medications: None Current orders for Inpatient glycemic control:  Levemir 5 units bid, Novolog 1 unit q 4 hours, Novolog 2-6 units q 4 hours  Inpatient Diabetes Program Recommendations:    Note patient was on IV insulin and transitioned early this morning.  Please consider increasing Novolog tube feed coverage to 3 units q 4 hours.   Thanks  Adah Perl, RN, BC-ADM Inpatient Diabetes Coordinator Pager [phone] (8a-5p)

## 2019-11-22 NOTE — Progress Notes (Addendum)
  NEUROSURGERY PROGRESS NOTE   No issues overnight.  EXAM:  BP (!) 125/51   Pulse 75   Temp (!) 100.5 F (38.1 C) (Axillary)   Resp 16   Ht 5\' 6"  (1.676 m)   Wt 78.4 kg   SpO2 100%   BMI 27.90 kg/m   Intubated, propofol and fenatnyl running Opens eyes to pain Pupils unequal, L>R, but both reactive Purposeful with RUE Does not follow commands Bandage from crani in place without drainage EVD in place draining bloody CSF.  Date POD PCO2 HCT BP  MCA ACA PCA OPHT SIPH VERT Basilar  4/12 JP     Right  Left   21  *   -70  *   7  *   23  33   38  52   *  *   *        IMPRESSION/PLAN 69 y.o. female d# 3, POD# 2 s/p clipping ruptured LMCA aneurysm. Neurologically stable.  - Cont supportive care - Cont Nimotop and Keppra - TCDs yesterday reviewed. Limited window but okay otherwise.  Continue MWF - Appreciate PCCM assistance

## 2019-11-23 DIAGNOSIS — I6012 Nontraumatic subarachnoid hemorrhage from left middle cerebral artery: Secondary | ICD-10-CM | POA: Diagnosis not present

## 2019-11-23 DIAGNOSIS — I609 Nontraumatic subarachnoid hemorrhage, unspecified: Secondary | ICD-10-CM | POA: Diagnosis not present

## 2019-11-23 LAB — CBC
HCT: 23.8 % — ABNORMAL LOW (ref 36.0–46.0)
Hemoglobin: 7.5 g/dL — ABNORMAL LOW (ref 12.0–15.0)
MCH: 30.2 pg (ref 26.0–34.0)
MCHC: 31.5 g/dL (ref 30.0–36.0)
MCV: 96 fL (ref 80.0–100.0)
Platelets: 276 10*3/uL (ref 150–400)
RBC: 2.48 MIL/uL — ABNORMAL LOW (ref 3.87–5.11)
RDW: 14.5 % (ref 11.5–15.5)
WBC: 12.5 10*3/uL — ABNORMAL HIGH (ref 4.0–10.5)
nRBC: 0 % (ref 0.0–0.2)

## 2019-11-23 LAB — BASIC METABOLIC PANEL
Anion gap: 12 (ref 5–15)
BUN: 44 mg/dL — ABNORMAL HIGH (ref 8–23)
CO2: 16 mmol/L — ABNORMAL LOW (ref 22–32)
Calcium: 8.9 mg/dL (ref 8.9–10.3)
Chloride: 117 mmol/L — ABNORMAL HIGH (ref 98–111)
Creatinine, Ser: 1.54 mg/dL — ABNORMAL HIGH (ref 0.44–1.00)
GFR calc Af Amer: 39 mL/min — ABNORMAL LOW (ref >60)
GFR calc non Af Amer: 34 mL/min — ABNORMAL LOW (ref >60)
Glucose, Bld: 163 mg/dL — ABNORMAL HIGH (ref 70–99)
Potassium: 3.4 mmol/L — ABNORMAL LOW (ref 3.5–5.1)
Sodium: 145 mmol/L (ref 135–145)

## 2019-11-23 LAB — CULTURE, RESPIRATORY W GRAM STAIN: Culture: NORMAL

## 2019-11-23 LAB — GLUCOSE, CAPILLARY
Glucose-Capillary: 101 mg/dL — ABNORMAL HIGH (ref 70–99)
Glucose-Capillary: 124 mg/dL — ABNORMAL HIGH (ref 70–99)
Glucose-Capillary: 128 mg/dL — ABNORMAL HIGH (ref 70–99)
Glucose-Capillary: 178 mg/dL — ABNORMAL HIGH (ref 70–99)
Glucose-Capillary: 182 mg/dL — ABNORMAL HIGH (ref 70–99)
Glucose-Capillary: 203 mg/dL — ABNORMAL HIGH (ref 70–99)

## 2019-11-23 LAB — PHOSPHORUS: Phosphorus: 3.9 mg/dL (ref 2.5–4.6)

## 2019-11-23 LAB — POTASSIUM: Potassium: 4.1 mmol/L (ref 3.5–5.1)

## 2019-11-23 LAB — MAGNESIUM: Magnesium: 2.4 mg/dL (ref 1.7–2.4)

## 2019-11-23 MED ORDER — POTASSIUM CHLORIDE 20 MEQ PO PACK
40.0000 meq | PACK | Freq: Two times a day (BID) | ORAL | Status: AC
  Administered 2019-11-23 (×2): 40 meq
  Filled 2019-11-23 (×2): qty 2

## 2019-11-23 MED ORDER — PLASMA-LYTE 148 IV SOLN
INTRAVENOUS | Status: DC
  Filled 2019-11-23 (×17): qty 500

## 2019-11-23 MED ORDER — HYDRALAZINE HCL 50 MG PO TABS
75.0000 mg | ORAL_TABLET | Freq: Three times a day (TID) | ORAL | Status: DC
  Administered 2019-11-23 – 2019-11-30 (×24): 75 mg
  Filled 2019-11-23 (×24): qty 1

## 2019-11-23 NOTE — Progress Notes (Signed)
  NEUROSURGERY PROGRESS NOTE   No issues overnight. Working on weaning, although mental status precludes extubation No longer on sedation  EXAM:  BP (!) 142/58   Pulse 98   Temp 98.9 F (37.2 C) (Axillary)   Resp (!) 24   Ht 5\' 6"  (1.676 m)   Wt 78 kg   SpO2 100%   BMI 27.75 kg/m   Intubated No sedation running Opens eyes to voice Follows commands by wiggling toes bilaterally ?gripping with BUE but certainly purposeful Crani bandage in place EVD in place with bloody CSF  Date POD PCO2 HCT BP  MCA ACA PCA OPHT SIPH VERT Basilar  6/8 JP     Right  Left   51  *   -44  *   23  *   23  25   38  43   *  *   *      6/9 GC     Right  Left   41  *   *  *   26  *   17  17   75  51   *  *   *       IMPRESSION/PLAN 69 y.o. female d# 4, POD# 3 s/p clipping ruptured LMCA aneurysm. Improved compared to yesterday. Working on weaning although mental status precludes extubation. - Cont supportive care - Cont Nimotop and Keppra -  Continue TCDs MWF - Appreciate PCCM assistance

## 2019-11-23 NOTE — Progress Notes (Addendum)
NAME:  [place], MRN:  [id number], DOB:  October 06, 1950, LOS: 4 ADMISSION DATE:  11/19/2019, CONSULTATION DATE:  11/20/19 REFERRING MD:  Zada Finders, CHIEF COMPLAINT:  SAH    Brief History   69 year old female with PMH significant for seizures, previous CVA's, HTN, and known left MCA aneurysm who reports for sudden unresponsiveness, left-sided facial droop, and right upper extremity weakness. CT Head revealed large volume left sided SAH with 14m midline shift.   History of present illness   69year old female with a PMH significant for seizure disorders, previous CVA's, and HTN.  LKWT approximately around noon on 6/6. Her niece reports that she slumped over and became unresponsive. This was associated with left-sided facial droop and right upper extremity weakness. Reportedly, she had a known left MCA aneurysm that was being monitored. CT head revealed large volume subarachnoid hemorrhage overlying the left cerebral hemisphere with 313mleft to right midline shift. Overnight, she acutely decompensated and required placement of an right frontal ventriculostomy by NSG. PCCM was consulted for concern for airway protection for which she was intubated.   Past Medical History  CVA (No residual deficits) HTN Seizures DM II   Significant Hospital Events   6/6 [facility] Acute decompensation - right frontal EVD placed. Intubated for airway protection  Consults:  PCCM Neurology Neurosurgery  Procedures:  6/7 EVD >> 6/7 ETT >>  6/7 Left pterional craniotomy for clipping of MCA aneurysm  Significant Diagnostic Tests:  6/7 CT Head >> Large volume SAH overlying left cerebral hemisphere with  left to right midline shift. New hydrocephalus of lateral and third vents. SAH reflux into occipital horns of lateral vents.   6/6 CTA Neck >> Patent common and internal carotids without significant stenosis. Mixed plaque within proximal left ICA. Patent vertebrals.   6/6 CTA Head >> 3x2 mm  left MCA bifurcation aneurysm (increased in size from April 2020). No intracranial LVO. High grade focal stenosis within inferior division mid M2 left MCA 6/9 renal u/s: Right renal cysts without complicating factors. No hydronephrosis.   Micro Data:  6/6 SARS CoV2 >> Negative 6/8 resp: Normal oral flora  Antimicrobials:  6/7 Rocephin >> 6/10  Interim history/subjective:  No acute events overnight. BP under adequate control with current agents.  Blood sugar under improved control with standing levemir and standing novolog with meals on top of SSI.  Patient appears more responsive today on exam. Able to open eyes and track to voice. Not following commands in any extremities.  EVD remains in place at 1067patient not currently sedated but remains on full mechanical ventilation. Mental status remains barrier to extubation.    Objective   Blood pressure (!) 128/49, pulse 87, temperature 100.3 F (37.9 C), temperature source Axillary, resp. rate 18, height _0  (1.676 m), weight 78 kg, SpO2 100 %.    Vent Mode: PSV;CPAP FiO2 (%):  [40 %] 40 % Set Rate:  [15 bmp] 15 bmp Vt Set:  [470 mL] 470 mL PEEP:  [5 cmH20] 5 cmH20 Pressure Support:  [5 cmH20] 5 cmH20 Plateau Pressure:  [13 cmH20-20 cmH20] 13 cmH20   Intake/Output Summary (Last 24 hours) at 11/23/2019 0758 Last data filed at 11/23/2019 0700 Gross per 24 hour  Intake 1247.55 ml  Output 1533 ml  Net -285.45 ml   Filed Weights   11/21/19 0500 11/22/19 0500 11/23/19 0500  Weight: 77.6 kg 78.4 kg 78 kg    Examination: General: Elderly female laying  in bed with eyes closed. No obvious distress HENT: Non-icteric sclera. MMM. Right sided facial droop. Gurgling from oropharyngeal cavity. Lungs: Faint crackles in R lower lung. No accessory muscle use. No signs of respiratory distress. Cardiovascular: Normal S1/S2. Sinus rhythm on monitor. No JVD. No murmurs or rubs. Abdomen: Soft, non-distended, and non-tender to  palpation Extremities: No peripheral edema. Radial and DP pulses 2+. Normal muscle tone and bulk without joint deformities Neuro: Does not follow commands. Opens eyes to voice. Withdraws to pain in lower extremities only. R pupil 63m reactive, left pupil 348mreactive.  GU: Deferred  Resolved Hospital Problem list     Assessment & Plan:  Acute respiratory failure secondary to encephalopathy Intubated 6/7 for airway protection. CXR does not show any focal opacities. Resp cx normal flora  Leukocytosis down-trending today and patient has been afebrile..  Given clinical improvements and normal oral flora, we will discontinue antibiotics today (day 4)  Plan: -Minimal vent settings, wean as tolerated, SBT today -ok for ps trials despite mental status precluding extubation, tolerated ~4h yesterday -Peak pressures < 30 cmH20 -VAP prevention bundle -continue to minimize sedating/ respiratory suppressing drugs -Discontinue rocephin today - PPI   Acute left-sided SAH with hydrocephalus, likely ruptured left MCA aneurysm Known prior left MCA aneurysm. Left cerebral hemisphere SAH with right to left midline shift. Right frontal ventriculostomy at 10 cmH20 placed by NSG 6/7 for obtundation and new hydrocephalus. S/p aneurysm clipping 6/7.  Plan: -Management per NSG.  -Neuro check q1h -Nimodipine for vasospasm prevention -weaning cleviprex for SBP <160 now that she is on on home anti-HTN meds -increasing home hydral to facilitate cleviprex wean  -keppra ongoing - EVD in place -Fever control with APAP to prevent secondary injury - TCDs  per NSG   Suspected AKI  Versus CKD. Creatinine 1.54 today, unable to access previous renal function labs. BUN also continuing to rise at 44. eGFR ~ 40. FENa studies yesterday suggestive of pre-renal AKI. Renal [place] not show any obstructive pattern or hydronephrosis. Could be due to renal hypoperfusion in the setting of tight blood pressure control and some  low MAPs. The patient has also had stably low Bicarb at 16 over the last several days in the setting of no anion gap. Concern for possible RTA.   Plan: -Start plasmalyte maintenance fluids -replace K -Continue to monitor and trend renal indices.  -Avoid nephrotoxic drugs   Hypertension, Restarted home Hydralazine 503mID and amlodipine 64m78mily yesterday. Continues to require additional clevidipine drip with good BP control. Plan: -SBP goal < 140  -Increase Hydralazine to 75mg42m. Continue amlodipine 64mg 66my - wean clevidipine as able  History of seizures Takes Keppra 500mg B30mt home. No seizure activity noted during admission Plan:  -Continue Keppra 500mg BI18m -Seizure precautions  DM II with hyperglycemia A1C 7.8 on admission. No home medications. Patient was given steroids 6/7. Blood sugar under better control after working with dietitian and diabetes coordinator. No on standard sliding scale insulin w/ additional standing q12h levemir and scheduled novolog q4. A couple blood sugars <130  Plan: -goal BG <180 mg/dL  -Continue to monitor closely   Best practice:  Diet: TF Pain/Anxiety/Delirium protocol (if indicated): fentanyl and propofol (but held as of this am) VAP protocol (if indicated): Yes DVT prophylaxis: Mechanical  GI prophylaxis: PPI Glucose control: < 180mg/dL.28mobility: BR Code Status: FULL Family Communication: per primary Disposition: Neuro ICU  Labs   CBC: Recent Labs  Lab 11/19/19 1459  11/19/19 1507 11/20/19 1409 11/20/19 1507 11/21/19 0518 11/22/19 0429 11/23/19 0545  WBC 5.6  --   --   --  14.0* 14.9* 12.5*  NEUTROABS 3.2  --   --   --   --   --   --   HGB 10.9*   < > 7.8* 8.5* 8.6* 8.1* 7.5*  HCT 33.5*   < > 23.0* 25.0* 27.0* 25.2* 23.8*  MCV 93.8  --   --   --  95.7 96.6 96.0  PLT 388  --   --   --  314 299 276   < > = values in this interval not displayed.    Basic Metabolic Panel: Recent Labs  Lab 11/19/19 1459  11/19/19 1507 11/20/19 0811 11/20/19 0935 11/20/19 1409 11/20/19 1507 11/21/19 0518 11/22/19 0429 11/23/19 0545  NA 139   < > 140   < > 142 142 141 142 145  K 4.2   < > 4.2   < > 3.6 3.9 4.0 3.8 3.4*  CL 110   < > 111   < > 108 111 115* 117* 117*  CO2 20*  --  20*  --   --   --  16* 16* 16*  GLUCOSE 188*   < > 255*   < > 120* 85 298* 230* 163*  BUN 13   < > 14   < > _0 32* 44*  CREATININE 1.25*   < > 1.23*   < > 1.10* 1.30* 1.45* 1.50* 1.54*  CALCIUM 8.9  --  9.0  --   --   --  8.4* 8.7* 8.9  MG  --   --   --   --   --   --  1.6* 2.2 2.4  PHOS  --   --   --   --   --   --  4.4  --  3.9   < > = values in this interval not displayed.   GFR: Estimated Creatinine Clearance: 35.8 mL/min (A) (by C-G formula based on SCr of 1.54 mg/dL (H)). Recent Labs  Lab 11/19/19 1459 11/21/19 0518 11/22/19 0429 11/23/19 0545  WBC 5.6 14.0* 14.9* 12.5*    Liver Function Tests: Recent Labs  Lab 11/19/19 1459 11/22/19 0429  AST 12* 15  ALT 7 11  ALKPHOS 99 85  BILITOT 0.6 0.4  PROT 6.4* 5.8*  ALBUMIN 3.0* 2.4*   No results for input(s): LIPASE, AMYLASE in the last 168 hours. No results for input(s): AMMONIA in the last 168 hours.  ABG    Component Value Date/Time   PHART 7.352 11/20/2019 1128   PCO2ART 38.8 11/20/2019 1128   PO2ART 164 (H) 11/20/2019 1128   HCO3 21.6 11/20/2019 1128   TCO2 19 (L) 11/20/2019 1507   ACIDBASEDEF 4.0 (H) 11/20/2019 1128   O2SAT 99.0 11/20/2019 1128     Coagulation Profile: Recent Labs  Lab 11/19/19 1459  INR 1.0    Cardiac Enzymes: No results for input(s): CKTOTAL, CKMB, CKMBINDEX, TROPONINI in the last 168 hours.  HbA1C: Hgb A1c MFr Bld  Date/Time Value Ref Range Status  11/21/2019 10:55 AM 7.8 (H) 4.8 - 5.6 % Final    Comment:    (NOTE) Pre diabetes:          5.7%-6.4% Diabetes:              >6.4% Glycemic control for   <7.0% adults with diabetes     CBG: Recent Labs  Lab 11/22/19 1131 11/22/19 1554 11/22/19 1948  11/22/19 2337 11/23/19 0339  GLUCAP 222* 152* 97 183* 128*      Critical care time: The patient is critically ill with multiple organ systems failure and requires high complexity decision making for assessment and support, frequent evaluation and titration of therapies, application of advanced monitoring technologies and extensive interpretation of multiple databases.  Critical care time 32 mins. This represents my time independent of the NPs time taking care of the pt. This is excluding procedures.    Audria Nine DO [facility] 11/23/2019, 10:50 AM Lorne Skeens, Medical Student

## 2019-11-23 NOTE — Plan of Care (Signed)
  Problem: Clinical Measurements: Goal: Neurologic status will improve Outcome: Progressing

## 2019-11-24 ENCOUNTER — Inpatient Hospital Stay (HOSPITAL_COMMUNITY): Payer: Medicare Other

## 2019-11-24 DIAGNOSIS — I609 Nontraumatic subarachnoid hemorrhage, unspecified: Secondary | ICD-10-CM

## 2019-11-24 DIAGNOSIS — I6012 Nontraumatic subarachnoid hemorrhage from left middle cerebral artery: Secondary | ICD-10-CM | POA: Diagnosis not present

## 2019-11-24 DIAGNOSIS — Z978 Presence of other specified devices: Secondary | ICD-10-CM

## 2019-11-24 LAB — BASIC METABOLIC PANEL
Anion gap: 11 (ref 5–15)
BUN: 43 mg/dL — ABNORMAL HIGH (ref 8–23)
CO2: 17 mmol/L — ABNORMAL LOW (ref 22–32)
Calcium: 9.4 mg/dL (ref 8.9–10.3)
Chloride: 121 mmol/L — ABNORMAL HIGH (ref 98–111)
Creatinine, Ser: 1.27 mg/dL — ABNORMAL HIGH (ref 0.44–1.00)
GFR calc Af Amer: 50 mL/min — ABNORMAL LOW (ref >60)
GFR calc non Af Amer: 43 mL/min — ABNORMAL LOW (ref >60)
Glucose, Bld: 168 mg/dL — ABNORMAL HIGH (ref 70–99)
Potassium: 4.3 mmol/L (ref 3.5–5.1)
Sodium: 149 mmol/L — ABNORMAL HIGH (ref 135–145)

## 2019-11-24 LAB — BPAM RBC
Blood Product Expiration Date: 202107102359
Blood Product Expiration Date: 202107102359
Unit Type and Rh: 5100
Unit Type and Rh: 5100

## 2019-11-24 LAB — TYPE AND SCREEN
ABO/RH(D): O POS
Antibody Screen: NEGATIVE
Unit division: 0
Unit division: 0

## 2019-11-24 LAB — PHOSPHORUS: Phosphorus: 3.1 mg/dL (ref 2.5–4.6)

## 2019-11-24 LAB — GLUCOSE, CAPILLARY
Glucose-Capillary: 125 mg/dL — ABNORMAL HIGH (ref 70–99)
Glucose-Capillary: 95 mg/dL (ref 70–99)
Glucose-Capillary: 193 mg/dL — ABNORMAL HIGH (ref 70–99)
Glucose-Capillary: 185 mg/dL — ABNORMAL HIGH (ref 70–99)
Glucose-Capillary: 186 mg/dL — ABNORMAL HIGH (ref 70–99)
Glucose-Capillary: 277 mg/dL — ABNORMAL HIGH (ref 70–99)

## 2019-11-24 LAB — CBC
HCT: 24.3 % — ABNORMAL LOW (ref 36.0–46.0)
Hemoglobin: 7.7 g/dL — ABNORMAL LOW (ref 12.0–15.0)
MCH: 30.7 pg (ref 26.0–34.0)
MCHC: 31.7 g/dL (ref 30.0–36.0)
MCV: 96.8 fL (ref 80.0–100.0)
Platelets: 303 10*3/uL (ref 150–400)
RBC: 2.51 MIL/uL — ABNORMAL LOW (ref 3.87–5.11)
RDW: 14.5 % (ref 11.5–15.5)
WBC: 11.4 10*3/uL — ABNORMAL HIGH (ref 4.0–10.5)
nRBC: 0 % (ref 0.0–0.2)

## 2019-11-24 LAB — MAGNESIUM: Magnesium: 2.4 mg/dL (ref 1.7–2.4)

## 2019-11-24 MED ORDER — LEVETIRACETAM 100 MG/ML PO SOLN
500.0000 mg | Freq: Two times a day (BID) | ORAL | Status: DC
  Administered 2019-11-24 – 2019-12-19 (×50): 500 mg
  Filled 2019-11-24 (×50): qty 5

## 2019-11-24 MED ORDER — JEVITY 1.2 CAL PO LIQD
1000.0000 mL | ORAL | Status: DC
  Administered 2019-11-24 – 2019-12-05 (×11): 1000 mL
  Filled 2019-11-24 (×16): qty 1000

## 2019-11-24 NOTE — Progress Notes (Signed)
Transcranial Doppler  Date POD PCO2 HCT BP  MCA ACA PCA OPHT SIPH VERT Basilar  6/8 JP     Right  Left   51  *   -44  *   23  *   23  25   38  43   *  *   *      6/9 GC     Right  Left   41  *   *  *   26  *   17  17   34  51   *  *   *      6/11 GC     Right  Left   *  68   *  -20   *  36   20  29   32  36   -38  -45   -44            Right  Left                                             Right  Left                                            Right  Left                                            Right  Left                                        MCA = Middle Cerebral Artery      OPHT = Opthalmic Artery     BASILAR = Basilar Artery   ACA = Anterior Cerebral Artery     SIPH = Carotid Siphon PCA = Posterior Cerebral Artery   VERT = Verterbral Artery                   Normal MCA = 62+\-12 ACA = 50+\-12 PCA = 42+\-23   * = not insonated. 11/24/19 12:08 PM Carlos Levering RVT

## 2019-11-24 NOTE — Progress Notes (Signed)
Waste 105 ml of fentanyl 2529mcg / 250 ml with Delfin Gant RN.

## 2019-11-24 NOTE — Progress Notes (Signed)
Nutrition Follow-up  DOCUMENTATION CODES:   Not applicable  INTERVENTION:   Tube feeding via OG tube: Jevity 1.2 @ 45 ml/h (1080 ml per day) Pro-stat 60 ml BID  Provides 1696 kcal, 120 gm protein, 875 ml free water daily   NUTRITION DIAGNOSIS:   Inadequate oral intake related to inability to eat as evidenced by NPO status.  GOAL:   Provide needs based on ASPEN/SCCM guidelines  MONITOR:   TF tolerance, Vent status, Labs  REASON FOR ASSESSMENT:   Consult, Ventilator Enteral/tube feeding initiation and management  ASSESSMENT:   Pt with PMH of seizures, previous CVA's, HTN, DM, and known L MCA aneurysm who was admitted with large L SAH from ruptured L MCA aneurysm.   Pt discussed during ICU rounds and with RN.  Per MD hopeful for extubation soon, pt remains very weak   6/7 s/p crani for clipping and EVD placement   Patient is currently intubated on ventilator support MV: 12.8 L/min Temp (24hrs), Avg:99.6 F (37.6 C), Min:98.9 F (37.2 C), Max:100.5 F (38.1 C)  Propofol: off Cleviprex off Medications reviewed and include: colace, 2-6 units novolog every 4 hours, 3 units novolog every 4 hours, 5 units levemir every 12 hours, MVI with minerals, miralax, senokot Plasmalyte-148 @ 50 ml/hr Labs reviewed: hgbA1C: 7.8 (H) CBG's: [id number]  EVD: 155 ml   TF: Vital High Protein @ 30 ml/hr with 60 ml Prostat BID Provides: 1120 kcal and 123 grams protein   Diet Order:   Diet Order            Diet NPO time specified  Diet effective now                 EDUCATION [id number]:   No education needs have been identified at this time  Skin:  Skin Assessment: Reviewed RN Assessment  Last BM:  250 via rectal tube  Height:   Ht Readings from Last 1 Encounters:  11/19/19 5\' 6"  (1.676 m)    Weight:   Wt Readings from Last 1 Encounters:  11/24/19 81.6 kg    Ideal Body Weight:  59 kg  BMI:  Body mass index is 29.04 kg/m.  Estimated Nutritional  Needs:   Kcal:  4163  Protein:  110-125 grams  Fluid:  2 L/day  Lockie Pares., RD, LDN, CNSC See AMiON for contact information

## 2019-11-24 NOTE — Progress Notes (Addendum)
  NEUROSURGERY PROGRESS NOTE   No issues overnight.  EXAM:  BP (!) 161/77   Pulse 89   Temp 99 F (37.2 C) (Oral)   Resp (!) 23   Ht 5\' 6"  (1.676 m)   Wt 81.6 kg   SpO2 100%   BMI 29.04 kg/m   Intubated No sedation running Opens eyes to voice Follows commands by wiggling toes bilaterally and showing thumbs Crani bandage in place EVD in place with bloody CSF  IMPRESSION/PLAN 69 y.o. female d#5, POD#4s/p clipping ruptured LMCA aneurysm.Stable neurologically. Working on weaning although mental status precludes extubation. - Cont supportive care - Cont [place] and [place] today - Appreciate PCCM assistance   I have seen and examined [employer] and agree with the exam, impression, and plan as documented in the note above by [employer], PA-C.  SAH d# 5 s/p clipping LMCA aneurysm, improving neurologically. - Wean vent per PCCM - Stop PRN anti-hypertensives - Cont Nimotop - TCD today  [name], MD [facility]

## 2019-11-24 NOTE — Progress Notes (Signed)
NAME:  [place], MRN:  [id number], DOB:  12-29-1950, LOS: 5 ADMISSION DATE:  11/19/2019, CONSULTATION DATE:  11/20/19 REFERRING MD:  Zada Finders, CHIEF COMPLAINT:  SAH    Brief History   69 year old female with PMH significant for seizures, previous CVA's, HTN, and known left MCA aneurysm who reports for sudden unresponsiveness, left-sided facial droop, and right upper extremity weakness. CT Head revealed large volume left sided SAH with 28mm midline shift.   History of present illness   69 year old female with a PMH significant for seizure disorders, previous CVA's, and HTN.  LKWT approximately around noon on 6/6. Her niece reports that she slumped over and became unresponsive. This was associated with left-sided facial droop and right upper extremity weakness. Reportedly, she had a known left MCA aneurysm that was being monitored. CT head revealed large volume subarachnoid hemorrhage overlying the left cerebral hemisphere with 21mm left to right midline shift. Overnight, she acutely decompensated and required placement of an right frontal ventriculostomy by NSG. PCCM was consulted for concern for airway protection for which she was intubated.   Past Medical History  CVA (No residual deficits) HTN Seizures DM II   Significant Hospital Events   6/6 Admitted to [facility] 6/7 Acute decompensation - right frontal EVD placed. Intubated for airway protection  Consults:  PCCM Neurology Neurosurgery  Procedures:  6/7 EVD >> 6/7 ETT >>  6/7 Left pterional craniotomy for clipping of MCA aneurysm  Significant Diagnostic Tests:  6/7 CT Head >> Large volume SAH overlying left cerebral hemisphere with  left to right midline shift. New hydrocephalus of lateral and third vents. SAH reflux into occipital horns of lateral vents.   6/6 CTA Neck >> Patent common and internal carotids without significant stenosis. Mixed plaque within proximal left ICA. Patent vertebrals.   6/6 CTA Head >> 3x2 mm  left MCA bifurcation aneurysm (increased in size from April 2020). No intracranial LVO. High grade focal stenosis within inferior division mid M2 left MCA 6/9 renal u/s: Right renal cysts without complicating factors. No hydronephrosis.   Micro Data:  6/6 SARS CoV2 >> Negative 6/8 resp: Normal oral flora  Antimicrobials:  6/7 Rocephin >> 6/10  Interim history/subjective:  tmax 100.5 over past 24h. uop remains good, gluc controlled, labs pending for renal function this am. Will wean for hopeful extubation soon but pt is extremely weak and unsure her ability to be successful after at this time, off sedation this am. Has tolerated sbt's and yesterday more consistent with following commands on R but just profoundly weak    Objective   Blood pressure (!) 161/63, pulse (!) 102, temperature (!) 100.5 F (38.1 C), temperature source Oral, resp. rate (!) 28, height 5\' 6"  (1.676 m), weight 78 kg, SpO2 100 %.    Vent Mode: PRVC FiO2 (%):  [40 %] 40 % Set Rate:  [15 bmp] 15 bmp Vt Set:  [470 mL] 470 mL PEEP:  [5 cmH20] 5 cmH20 Pressure Support:  [5 cmH20] 5 cmH20   Intake/Output Summary (Last 24 hours) at 11/24/2019 0445 Last data filed at 11/24/2019 0100 Gross per 24 hour  Intake 1447.22 ml  Output 2454 ml  Net -1006.78 ml   Filed Weights   11/21/19 0500 11/22/19 0500 11/23/19 0500  Weight: 77.6 kg 78.4 kg 78 kg    Examination: General: Elderly female laying in bed with eyes closed. No obvious distress HENT: Non-icteric sclera. MMM. Right sided facial droop. ett in place Lungs:  clear bilaterally Cardiovascular: Normal S1/S2. Sinus rhythm on monitor. No JVD. No murmurs or rubs. Abdomen: Soft, non-distended, and non-tender to palpation Extremities: No peripheral edema. Radial and DP pulses 2+. Normal muscle tone and bulk without joint deformities Neuro: follows commands weakly on RUE and RLE. Opens eyes to voice.  GU: Deferred  Resolved Hospital Problem list     Assessment &  Plan:  Acute respiratory failure secondary to encephalopathy Intubated 6/7 for airway protection. CXR does not show any focal opacities. Resp cx normal flora  Leukocytosis down-trending today and patient has been afebrile..  Given clinical improvements and normal oral flora, we have discontinued antibiotics (day 4)  Plan: -Minimal vent settings, wean as tolerated, SAT/SBT today again.  -profoundly weak unsure if she will be able to clear secretions when extubated despite tolerating sbt's -Peak pressures < 30 cmH20 -VAP prevention bundle -off sedation this am and weakly following commands - PPI   Acute left-sided SAH with hydrocephalus, likely ruptured left MCA aneurysm Known prior left MCA aneurysm. Left cerebral hemisphere SAH with right to left midline shift. Right frontal ventriculostomy at 10 cmH20 placed by NSG 6/7 for obtundation and new hydrocephalus. S/p aneurysm clipping 6/7.  Plan: -Management per NSG.  -Neuro checks ongoing -Nimodipine for vasospasm prevention -off cleviprex, cont PO anti-HTN meds -keppra ongoing - EVD in place at 10 -Fever control with APAP to prevent secondary injury - TCDs  per NSG   Suspected AKI  Versus CKD. Creatinine pending today, unable to access previous renal function labs.  - FENa studies 6/9 suggestive of pre-renal AKI.  -Renal US did not show any obstructive pattern or hydronephrosis.  -The patient has also had stably low Bicarb at 16 over the last several days in the setting of no anion gap. Concern for possible RTA.   Plan: -Started plasmalyte maintenance fluids, will f/u bmp this am and adjust -Continue to monitor and trend renal indices.  -uop remains adequate -Avoid nephrotoxic drugs   Hypertension, Plan: -SBP to autoregulate, goal <160  -cont increased Hydralazine to 75mg  TID. Continue amlodipine 10mg  daily -neurosurg stopped cleviprex  History of seizures Takes Keppra 500mg  BID at home. No seizure activity noted during  admission Plan:  -Continue Keppra 500mg  BID IV -Seizure precautions  DM II with hyperglycemia A1C 7.8 on admission. No home medications.  Plan: -goal BG <180 mg/dL  -improved glucose control -Continue to monitor closely   Best practice:  Diet: TF Pain/Anxiety/Delirium protocol (if indicated): off continuous this am VAP protocol (if indicated): Yes DVT prophylaxis: Mechanical  GI prophylaxis: PPI Glucose control: < 180mg /dL.   Mobility: BR Code Status: FULL Family Communication: per primary Disposition: Neuro ICU  Labs   CBC: Recent Labs  Lab 11/19/19 1459 11/19/19 1507 11/20/19 1409 11/20/19 1507 11/21/19 0518 11/22/19 0429 11/23/19 0545  WBC 5.6  --   --   --  14.0* 14.9* 12.5*  NEUTROABS 3.2  --   --   --   --   --   --   HGB 10.9*   < > 7.8* 8.5* 8.6* 8.1* 7.5*  HCT 33.5*   < > 23.0* 25.0* 27.0* 25.2* 23.8*  MCV 93.8  --   --   --  95.7 96.6 96.0  PLT 388  --   --   --  314 299 276   < > = values in this interval not displayed.    Basic Metabolic Panel: Recent Labs  Lab 11/19/19 1459 11/19/19 1507 11/20/19 0811 11/20/19 0935 11/20/19  1409 11/20/19 1409 11/20/19 1507 11/21/19 0518 11/22/19 0429 11/23/19 0545 11/23/19 1400  NA 139   < > 140   < > 142  --  142 141 142 145  --   K 4.2   < > 4.2   < > 3.6   < > 3.9 4.0 3.8 3.4* 4.1  CL 110   < > 111   < > 108  --  111 115* 117* 117*  --   CO2 20*  --  20*  --   --   --   --  16* 16* 16*  --   GLUCOSE 188*   < > 255*   < > 120*  --  85 298* 230* 163*  --   BUN 13   < > 14   < > 14  --  13 17 32* 44*  --   CREATININE 1.25*   < > 1.23*   < > 1.10*  --  1.30* 1.45* 1.50* 1.54*  --   CALCIUM 8.9  --  9.0  --   --   --   --  8.4* 8.7* 8.9  --   MG  --   --   --   --   --   --   --  1.6* 2.2 2.4  --   PHOS  --   --   --   --   --   --   --  4.4  --  3.9  --    < > = values in this interval not displayed.   GFR: Estimated Creatinine Clearance: 35.8 mL/min (A) (by C-G formula based on SCr of 1.54 mg/dL  (H)). Recent Labs  Lab 11/19/19 1459 11/21/19 0518 11/22/19 0429 11/23/19 0545  WBC 5.6 14.0* 14.9* 12.5*    Liver Function Tests: Recent Labs  Lab 11/19/19 1459 11/22/19 0429  AST 12* 15  ALT 7 11  ALKPHOS 99 85  BILITOT 0.6 0.4  PROT 6.4* 5.8*  ALBUMIN 3.0* 2.4*   No results for input(s): LIPASE, AMYLASE in the last 168 hours. No results for input(s): AMMONIA in the last 168 hours.  ABG    Component Value Date/Time   PHART 7.352 11/20/2019 1128   PCO2ART 38.8 11/20/2019 1128   PO2ART 164 (H) 11/20/2019 1128   HCO3 21.6 11/20/2019 1128   TCO2 19 (L) 11/20/2019 1507   ACIDBASEDEF 4.0 (H) 11/20/2019 1128   O2SAT 99.0 11/20/2019 1128     Coagulation Profile: Recent Labs  Lab 11/19/19 1459  INR 1.0    Cardiac Enzymes: No results for input(s): CKTOTAL, CKMB, CKMBINDEX, TROPONINI in the last 168 hours.  HbA1C: Hgb A1c MFr Bld  Date/Time Value Ref Range Status  11/21/2019 10:55 AM 7.8 (H) 4.8 - 5.6 % Final    Comment:    (NOTE) Pre diabetes:          5.7%-6.4% Diabetes:              >6.4% Glycemic control for   <7.0% adults with diabetes     CBG: Recent Labs  Lab 11/23/19 1141 11/23/19 1545 11/23/19 1930 11/23/19 2337 11/24/19 0316  GLUCAP 203* 124* 101* 182* 125*      Critical care time: The patient is critically ill with multiple organ systems failure and requires high complexity decision making for assessment and support, frequent evaluation and titration of therapies, application of advanced monitoring technologies and extensive interpretation of multiple databases.  Critical care time 33 mins.  This represents my time independent of the NPs time taking care of the pt. This is excluding procedures.    Audria Nine DO [facility] 11/24/2019, 4:45 AM Lorne Skeens, Medical Student

## 2019-11-25 DIAGNOSIS — J988 Other specified respiratory disorders: Secondary | ICD-10-CM

## 2019-11-25 DIAGNOSIS — I609 Nontraumatic subarachnoid hemorrhage, unspecified: Secondary | ICD-10-CM | POA: Diagnosis not present

## 2019-11-25 DIAGNOSIS — I6012 Nontraumatic subarachnoid hemorrhage from left middle cerebral artery: Secondary | ICD-10-CM | POA: Diagnosis not present

## 2019-11-25 LAB — GLUCOSE, CAPILLARY
Glucose-Capillary: 192 mg/dL — ABNORMAL HIGH (ref 70–99)
Glucose-Capillary: 199 mg/dL — ABNORMAL HIGH (ref 70–99)
Glucose-Capillary: 276 mg/dL — ABNORMAL HIGH (ref 70–99)
Glucose-Capillary: 189 mg/dL — ABNORMAL HIGH (ref 70–99)
Glucose-Capillary: 222 mg/dL — ABNORMAL HIGH (ref 70–99)
Glucose-Capillary: 269 mg/dL — ABNORMAL HIGH (ref 70–99)

## 2019-11-25 MED ORDER — INSULIN DETEMIR 100 UNIT/ML ~~LOC~~ SOLN
5.0000 [IU] | Freq: Two times a day (BID) | SUBCUTANEOUS | Status: DC
  Administered 2019-11-25: 5 [IU] via SUBCUTANEOUS
  Filled 2019-11-25 (×3): qty 0.05

## 2019-11-25 MED ORDER — INSULIN ASPART 100 UNIT/ML ~~LOC~~ SOLN
0.0000 [IU] | SUBCUTANEOUS | Status: DC
  Administered 2019-11-25: 7 [IU] via SUBCUTANEOUS
  Administered 2019-11-25: 4 [IU] via SUBCUTANEOUS
  Administered 2019-11-25 (×2): 11 [IU] via SUBCUTANEOUS
  Administered 2019-11-26: 7 [IU] via SUBCUTANEOUS
  Administered 2019-11-26: 3 [IU] via SUBCUTANEOUS

## 2019-11-25 MED ORDER — INSULIN DETEMIR 100 UNIT/ML ~~LOC~~ SOLN
5.0000 [IU] | Freq: Two times a day (BID) | SUBCUTANEOUS | Status: DC
  Filled 2019-11-25 (×2): qty 0.05

## 2019-11-25 NOTE — Progress Notes (Signed)
NAME:  [place], MRN:  [id number], DOB:  Dec 08, 1950, LOS: 6 ADMISSION DATE:  11/19/2019, CONSULTATION DATE:  11/20/19 REFERRING MD:  Zada Finders, CHIEF COMPLAINT:  SAH    Brief History   69 year old female with PMH significant for seizures, previous CVA's, HTN, and known left MCA aneurysm who reports for sudden unresponsiveness, left-sided facial droop, and right upper extremity weakness. CT Head revealed large volume left sided SAH with 36mm midline shift.   Past Medical History  CVA (No residual deficits) HTN Seizures DM II   Significant Hospital Events   6/06 Admitted to [facility] Neuro[facility] 6/07 Acute decompensation - right frontal EVD placed. Intubated for airway protection 6/11 fever 101.3 6/12 change to pressure control  Consults:  Neurology Neurosurgery  Procedures:  6/7 EVD >> 6/7 ETT >>  6/7 Left pterional craniotomy for clipping of MCA aneurysm  Significant Diagnostic Tests:  6/7 CT Head >> Large volume SAH overlying left cerebral hemisphere with  left to right midline shift. New hydrocephalus of lateral and third vents. SAH reflux into occipital horns of lateral vents.   6/6 CTA Neck >> Patent common and internal carotids without significant stenosis. Mixed plaque within proximal left ICA. Patent vertebrals.   6/6 CTA Head >> 3x2 mm left MCA bifurcation aneurysm (increased in size from April 2020). No intracranial LVO. High grade focal stenosis within inferior division mid M2 left MCA  6/9 renal u/s >> Right renal cysts without complicating factors.  No hydronephrosis.  Micro Data:  6/6 SARS CoV2 >> Negative 6/8 resp >>  Normal oral flora  Antimicrobials:  6/7 Rocephin >> 6/10  Interim history/subjective:  Increased RR with SBT.  Objective   Blood pressure (!) 168/75, pulse 86, temperature (!) 100.4 F (38 C), temperature source Axillary, resp. rate (!) 23, height 5\' 6"  (1.676 m), weight 77.8 kg, SpO2 100 %.    Vent Mode: CPAP;PSV FiO2 (%):  [40 %] 40  % Set Rate:  [15 bmp] 15 bmp Vt Set:  [470 mL] 470 mL PEEP:  [5 cmH20] 5 cmH20 Pressure Support:  [5 cmH20] 5 cmH20 Plateau Pressure:  [17 cmH20] 17 cmH20   Intake/Output Summary (Last 24 hours) at 11/25/2019 1021 Last data filed at 11/25/2019 0800 Gross per 24 hour  Intake 2043.96 ml  Output 1289 ml  Net 754.96 ml   Filed Weights   11/23/19 0500 11/24/19 0500 11/25/19 0427  Weight: 78 kg 81.6 kg 77.8 kg    Examination:  General - somnolent Eyes - pupils reactive ENT - ETT in place Cardiac - regular rate/rhythm, no murmur Chest - b/l rhonchi Abdomen - soft, non tender, + bowel sounds Extremities - 1+ edema Skin - no rashes Neuro - not following commands   Resolved Hospital Problem list     Assessment & Plan:   Acute hypoxic respiratory failure with compromised airway in setting of SAH. - change to pressure control - goal SpO2 > 92% - f/u CXR - not ready for extubation; limited by neuro status and WOB  Acute left-sided SAH with hydrocephalus, likely ruptured left MCA aneurysm. Hx of seizures. - Known prior left MCA aneurysm.  - Left cerebral hemisphere SAH with right to left midline shift. Right frontal ventriculostomy at 10 cmH20 placed by NSG 6/7 for obtundation and new hydrocephalus.  - S/p aneurysm clipping 6/7.  - continue nimotop, keppra  Fever. - likely central - continue tylenol - cooling blanket as needed  CKD 2. Non gap metabolic acidosis. - f/u  BMET - continue plamalyte  HTN. - goal SBP < 160 - continue norvasc, hydralazine  DM II poorly controlled with hyperglycemia. - SSI   Best practice:  Diet: Tube feeds DVT prophylaxis: SCDs; add lovenox when okay with neurosurgery GI prophylaxis: protonix   Mobility: bed rest Code Status: full code Disposition: Neuro ICU  Labs    CMP Latest Ref Rng & Units 11/24/2019 11/23/2019 11/23/2019  Glucose 70 - 99 mg/dL 168(H) - 163(H)  BUN 8 - 23 mg/dL 43(H) - 44(H)  Creatinine 0.44 - 1.00 mg/dL  1.27(H) - 1.54(H)  Sodium 135 - 145 mmol/L 149(H) - 145  Potassium 3.5 - 5.1 mmol/L 4.3 4.1 3.4(L)  Chloride 98 - 111 mmol/L 121(H) - 117(H)  CO2 22 - 32 mmol/L 17(L) - 16(L)  Calcium 8.9 - 10.3 mg/dL 9.4 - 8.9  Total Protein 6.5 - 8.1 g/dL - - -  Total Bilirubin 0.3 - 1.2 mg/dL - - -  Alkaline Phos 38 - 126 U/L - - -  AST 15 - 41 U/L - - -  ALT 0 - 44 U/L - - -    CBC Latest Ref Rng & Units 11/24/2019 11/23/2019 11/22/2019  WBC 4.0 - 10.5 K/uL 11.4(H) 12.5(H) 14.9(H)  Hemoglobin 12.0 - 15.0 g/dL 7.7(L) 7.5(L) 8.1(L)  Hematocrit 36 - 46 % 24.3(L) 23.8(L) 25.2(L)  Platelets 150 - 400 K/uL 303 276 299    ABG    Component Value Date/Time   PHART 7.352 11/20/2019 1128   PCO2ART 38.8 11/20/2019 1128   PO2ART 164 (H) 11/20/2019 1128   HCO3 21.6 11/20/2019 1128   TCO2 19 (L) 11/20/2019 1507   ACIDBASEDEF 4.0 (H) 11/20/2019 1128   O2SAT 99.0 11/20/2019 1128    CBG (last 3)  Recent Labs    11/24/19 2316 11/25/19 0331 11/25/19 0742  GLUCAP 277* 192* 199*     Critical care time: 34 minutes  Chesley Mires, MD [place] Pager - [phone] 11/25/2019, 10:31 AM

## 2019-11-25 NOTE — Progress Notes (Signed)
Subjective: NAEs o/n  Objective: Vital signs in last 24 hours: Temp:  [98.7 F (37.1 C)-101.3 F (38.5 C)] 100.4 F (38 C) (06/12 0800) Pulse Rate:  [85-117] 96 (06/12 1100) Resp:  [19-33] 22 (06/12 1100) BP: (134-168)/(55-75) 145/62 (06/12 1100) SpO2:  [100 %] 100 % (06/12 1100) FiO2 (%):  [40 %] 40 % (06/12 0823) Weight:  [77.8 kg] 77.8 kg (06/12 0427)  Intake/Output from previous day: 06/11 0701 - 06/12 0700 In: 1080.7 [I.V.:593.2; NG/GT:487.5] Out: 2005 [Urine:1900; Drains:105] Intake/Output this shift: Total I/O In: 1333 [I.V.:613; NG/GT:720] Out: 2 [Drains:2]  Eyes open to stim.  Localizes in UEs, w/d in LEs. Head dressed.   Lab Results: Recent Labs    11/23/19 0545 11/24/19 0548  WBC 12.5* 11.4*  HGB 7.5* 7.7*  HCT 23.8* 24.3*  PLT 276 303   BMET Recent Labs    11/23/19 0545 11/23/19 0545 11/23/19 1400 11/24/19 0548  NA 145  --   --  149*  K 3.4*   < > 4.1 4.3  CL 117*  --   --  121*  CO2 16*  --   --  17*  GLUCOSE 163*  --   --  168*  BUN 44*  --   --  43*  CREATININE 1.54*  --   --  1.27*  CALCIUM 8.9  --   --  9.4   < > = values in this interval not displayed.    Studies/Results: VAS [place] TRANSCRANIAL DOPPLER  Result Date: 11/24/2019  Transcranial Doppler Indications: Subarachnoid hemorrhage. Limitations: Bandages, patient positioning, vent. Limitations for diagnostic windows: Unable to insonate right transtemporal window. Comparison Study: 11/22/2019 Performing Technologist: Oliver Hum RVT  Examination Guidelines: A complete evaluation includes B-mode imaging, spectral Doppler, color Doppler, and power Doppler as needed of all accessible portions of each vessel. Bilateral testing is considered an integral part of a complete examination. Limited examinations for reoccurring indications may be performed as noted.  +----------+-------------+----------+-----------+-------+ RIGHT TCD Right VM (cm)Depth (cm)PulsatilityComment  +----------+-------------+----------+-----------+-------+ Opthalmic     20.00                 1.43            +----------+-------------+----------+-----------+-------+ ICA siphon    32.00                 1.32            +----------+-------------+----------+-----------+-------+ Vertebral    -38.00                 1.52            +----------+-------------+----------+-----------+-------+  +----------+------------+----------+-----------+-------+ LEFT TCD  Left VM (cm)Depth (cm)PulsatilityComment +----------+------------+----------+-----------+-------+ MCA          68.00                 1.11            +----------+------------+----------+-----------+-------+ ACA          -20.00                1.33            +----------+------------+----------+-----------+-------+ Term ICA     39.00                 1.29            +----------+------------+----------+-----------+-------+ PCA          36.00  1.13            +----------+------------+----------+-----------+-------+ Opthalmic    29.00                 1.88            +----------+------------+----------+-----------+-------+ ICA siphon   36.00                 1.97            +----------+------------+----------+-----------+-------+ Vertebral    -45.00                1.45            +----------+------------+----------+-----------+-------+  +------------+-------+-------+             VM cm/sComment +------------+-------+-------+ Prox Basilar-44.00  1.48   +------------+-------+-------+ Dist Basilar-33.00  1.65   +------------+-------+-------+ Summary:  Absent right temoral window limuits exam. Normal mean flow velocities in remaining identified vessels of anterior and posterior cerebral circulation. No evidence of vasospasm noted. *See table(s) above for TCD measurements and observations.  Diagnosing physician: Antony Contras MD Electronically signed by Antony Contras MD on 11/24/2019 at 1:21:52  PM.    Final     Assessment/Plan: PBD#6 s/p craniotomy for ruptured L MCA aneurysm.  - cont permissive HTN - cont EVD drainage for now.  Potential weaning early next week. - supportive care per [place] 11/25/2019, 11:32 AM

## 2019-11-26 ENCOUNTER — Inpatient Hospital Stay (HOSPITAL_COMMUNITY): Payer: Medicare Other

## 2019-11-26 DIAGNOSIS — I6012 Nontraumatic subarachnoid hemorrhage from left middle cerebral artery: Secondary | ICD-10-CM | POA: Diagnosis not present

## 2019-11-26 DIAGNOSIS — I609 Nontraumatic subarachnoid hemorrhage, unspecified: Secondary | ICD-10-CM | POA: Diagnosis not present

## 2019-11-26 LAB — GLUCOSE, CAPILLARY
Glucose-Capillary: 132 mg/dL — ABNORMAL HIGH (ref 70–99)
Glucose-Capillary: 243 mg/dL — ABNORMAL HIGH (ref 70–99)
Glucose-Capillary: 220 mg/dL — ABNORMAL HIGH (ref 70–99)
Glucose-Capillary: 210 mg/dL — ABNORMAL HIGH (ref 70–99)
Glucose-Capillary: 90 mg/dL (ref 70–99)

## 2019-11-26 LAB — CBC
HCT: 25.7 % — ABNORMAL LOW (ref 36.0–46.0)
Hemoglobin: 7.7 g/dL — ABNORMAL LOW (ref 12.0–15.0)
MCH: 29.7 pg (ref 26.0–34.0)
MCHC: 30 g/dL (ref 30.0–36.0)
MCV: 99.2 fL (ref 80.0–100.0)
Platelets: 346 K/uL (ref 150–400)
RBC: 2.59 MIL/uL — ABNORMAL LOW (ref 3.87–5.11)
RDW: 15.1 % (ref 11.5–15.5)
WBC: 7.8 K/uL (ref 4.0–10.5)
nRBC: 0 % (ref 0.0–0.2)

## 2019-11-26 LAB — BASIC METABOLIC PANEL
Anion gap: 9 (ref 5–15)
BUN: 57 mg/dL — ABNORMAL HIGH (ref 8–23)
CO2: 16 mmol/L — ABNORMAL LOW (ref 22–32)
Calcium: 9.6 mg/dL (ref 8.9–10.3)
Chloride: 126 mmol/L — ABNORMAL HIGH (ref 98–111)
Creatinine, Ser: 1.44 mg/dL — ABNORMAL HIGH (ref 0.44–1.00)
GFR calc Af Amer: 43 mL/min — ABNORMAL LOW (ref >60)
GFR calc non Af Amer: 37 mL/min — ABNORMAL LOW (ref >60)
Glucose, Bld: 257 mg/dL — ABNORMAL HIGH (ref 70–99)
Potassium: 4.7 mmol/L (ref 3.5–5.1)
Sodium: 151 mmol/L — ABNORMAL HIGH (ref 135–145)

## 2019-11-26 LAB — MAGNESIUM: Magnesium: 2.6 mg/dL — ABNORMAL HIGH (ref 1.7–2.4)

## 2019-11-26 LAB — PHOSPHORUS: Phosphorus: 4 mg/dL (ref 2.5–4.6)

## 2019-11-26 MED ORDER — INSULIN DETEMIR 100 UNIT/ML ~~LOC~~ SOLN
8.0000 [IU] | Freq: Two times a day (BID) | SUBCUTANEOUS | Status: DC
  Administered 2019-11-26 – 2019-11-30 (×9): 8 [IU] via SUBCUTANEOUS
  Filled 2019-11-26 (×10): qty 0.08

## 2019-11-26 MED ORDER — INSULIN ASPART 100 UNIT/ML ~~LOC~~ SOLN
0.0000 [IU] | SUBCUTANEOUS | Status: DC
  Administered 2019-11-26 (×2): 7 [IU] via SUBCUTANEOUS
  Administered 2019-11-27 (×2): 3 [IU] via SUBCUTANEOUS
  Administered 2019-11-27: 4 [IU] via SUBCUTANEOUS
  Administered 2019-11-27: 0 [IU] via SUBCUTANEOUS
  Administered 2019-11-27: 11 [IU] via SUBCUTANEOUS
  Administered 2019-11-27 – 2019-11-28 (×2): 3 [IU] via SUBCUTANEOUS
  Administered 2019-11-28 (×3): 4 [IU] via SUBCUTANEOUS
  Administered 2019-11-28: 3 [IU] via SUBCUTANEOUS
  Administered 2019-11-28 – 2019-11-29 (×2): 4 [IU] via SUBCUTANEOUS
  Administered 2019-11-29 (×3): 3 [IU] via SUBCUTANEOUS
  Administered 2019-11-29 – 2019-11-30 (×3): 4 [IU] via SUBCUTANEOUS

## 2019-11-26 MED ORDER — INSULIN ASPART 100 UNIT/ML ~~LOC~~ SOLN
2.0000 [IU] | SUBCUTANEOUS | Status: DC
  Administered 2019-11-26 – 2019-11-30 (×23): 2 [IU] via SUBCUTANEOUS

## 2019-11-26 MED ORDER — FENTANYL CITRATE (PF) 100 MCG/2ML IJ SOLN
25.0000 ug | INTRAMUSCULAR | Status: DC | PRN
  Administered 2019-11-27: 100 ug via INTRAVENOUS
  Administered 2019-11-27 – 2019-11-28 (×4): 50 ug via INTRAVENOUS
  Administered 2019-11-28 – 2019-12-02 (×8): 100 ug via INTRAVENOUS
  Administered 2019-12-02: 25 ug via INTRAVENOUS
  Administered 2019-12-03 – 2019-12-07 (×10): 100 ug via INTRAVENOUS
  Administered 2019-12-07: 50 ug via INTRAVENOUS
  Administered 2019-12-08: 100 ug via INTRAVENOUS
  Administered 2019-12-08: 50 ug via INTRAVENOUS
  Administered 2019-12-08: 100 ug via INTRAVENOUS
  Administered 2019-12-08: 50 ug via INTRAVENOUS
  Administered 2019-12-09 – 2019-12-10 (×7): 100 ug via INTRAVENOUS
  Administered 2019-12-11: 50 ug via INTRAVENOUS
  Filled 2019-11-26 (×37): qty 2

## 2019-11-26 MED ORDER — FREE WATER
200.0000 mL | Freq: Four times a day (QID) | Status: DC
  Administered 2019-11-26 – 2019-11-28 (×7): 200 mL

## 2019-11-26 NOTE — Progress Notes (Signed)
NAME:  Rebecca Green, MRN:  [id number], DOB:  1950-10-02, LOS: 7 ADMISSION DATE:  11/19/2019, CONSULTATION DATE:  11/20/19 REFERRING MD:  Zada Finders, CHIEF COMPLAINT:  SAH    Brief History   69 year old female with PMH significant for seizures, previous CVA's, HTN, and known left MCA aneurysm who reports for sudden unresponsiveness, left-sided facial droop, and right upper extremity weakness. CT Head revealed large volume left sided SAH with 53mm midline shift.   Past Medical History  CVA (No residual deficits) HTN Seizures DM II   Significant Hospital Events   6/06 Admitted to [facility] Neuro[facility] 6/07 Acute decompensation - right frontal EVD placed. Intubated for airway protection 6/11 fever 101.3 6/12 change to pressure control  Consults:  Neurology Neurosurgery  Procedures:  6/7 EVD >> 6/7 ETT >>  6/7 Left pterional craniotomy for clipping of MCA aneurysm  Significant Diagnostic Tests:   6/7 CT Head >> Large volume SAH overlying left cerebral hemisphere with  left to right midline shift. New hydrocephalus of lateral and third vents. SAH reflux into occipital horns of lateral vents.   6/6 CTA Neck >> Patent common and internal carotids without significant stenosis. Mixed plaque within proximal left ICA. Patent vertebrals.   6/6 CTA Head >> 3x2 mm left MCA bifurcation aneurysm (increased in size from April 2020). No intracranial LVO. High grade focal stenosis within inferior division mid M2 left MCA  6/9 renal u/s >> Right renal cysts without complicating factors.  No hydronephrosis.  Micro Data:  6/6 SARS CoV2 >> Negative 6/8 resp >>  Normal oral flora  Antimicrobials:  6/7 Rocephin >> 6/10  Interim history/subjective:  Tolerating pressure support better.  Objective   Blood pressure (!) 157/71, pulse (!) 103, temperature 99.1 F (37.3 C), temperature source Axillary, resp. rate (!) 23, height 5\' 6"  (1.676 m), weight 77.8 kg, SpO2 100 %.    Vent Mode: PSV;CPAP FiO2 (%):   [40 %] 40 % Set Rate:  [20 bmp] 20 bmp PEEP:  [5 cmH20] 5 cmH20 Pressure Support:  [10 cmH20] 10 cmH20 Plateau Pressure:  [12 cmH20-17 cmH20] 15 cmH20   Intake/Output Summary (Last 24 hours) at 11/26/2019 7829 Last data filed at 11/26/2019 0900 Gross per 24 hour  Intake 2081.01 ml  Output 2060 ml  Net 21.01 ml   Filed Weights   11/23/19 0500 11/24/19 0500 11/25/19 0427  Weight: 78 kg 81.6 kg 77.8 kg    Examination:  General - somnolent Eyes - pupils reactive ENT - ETT in place, protuberant tongue Cardiac - regular rate/rhythm, no murmur Chest - scattered rhonchi Abdomen - soft, non tender, + bowel sounds Extremities - 2+ edema Skin - no rashes Neuro - follows simple commands, moves Rt side  Resolved Hospital Problem list     Assessment & Plan:   Acute hypoxic respiratory failure with compromised airway in setting of SAH. - pressure control as rest mode - pressure support wean as able - mental status, upper airway anatomy and deconditioning barriers to extubation trial at this time; might need trach to assist with vent weaning - goal SpO2 > 92%  Acute left-sided SAH with hydrocephalus, likely ruptured left MCA aneurysm. Hx of seizures. - Known prior left MCA aneurysm.  - Left cerebral hemisphere SAH with right to left midline shift. Right frontal ventriculostomy at 10 cmH20 placed by NSG 6/7 for obtundation and new hydrocephalus.  - S/p aneurysm clipping 6/7.  - continue nimotop, keppra  Fever. - likely central - continue  tylenol - cooling blanket as needed  CKD 2. Non gap metabolic acidosis. - f/u BMET - continue plasmalyte  HTN. - goal SBP < 160 - continue norvasc, hydralazine  DM II poorly controlled with hyperglycemia. - SSI - increase levemir to 8 units bid - add tube feed coverage   Best practice:  Diet: Tube feeds DVT prophylaxis: SCDs; add lovenox when okay with neurosurgery GI prophylaxis: protonix   Mobility: bed rest Code Status: full  code Disposition: Neuro ICU  Labs    CMP Latest Ref Rng & Units 11/24/2019 11/23/2019 11/23/2019  Glucose 70 - 99 mg/dL 168(H) - 163(H)  BUN 8 - 23 mg/dL 43(H) - 44(H)  Creatinine 0.44 - 1.00 mg/dL 1.27(H) - 1.54(H)  Sodium 135 - 145 mmol/L 149(H) - 145  Potassium 3.5 - 5.1 mmol/L 4.3 4.1 3.4(L)  Chloride 98 - 111 mmol/L 121(H) - 117(H)  CO2 22 - 32 mmol/L 17(L) - 16(L)  Calcium 8.9 - 10.3 mg/dL 9.4 - 8.9  Total Protein 6.5 - 8.1 g/dL - - -  Total Bilirubin 0.3 - 1.2 mg/dL - - -  Alkaline Phos 38 - 126 U/L - - -  AST 15 - 41 U/L - - -  ALT 0 - 44 U/L - - -    CBC Latest Ref Rng & Units 11/24/2019 11/23/2019 11/22/2019  WBC 4.0 - 10.5 K/uL 11.4(H) 12.5(H) 14.9(H)  Hemoglobin 12.0 - 15.0 g/dL 7.7(L) 7.5(L) 8.1(L)  Hematocrit 36 - 46 % 24.3(L) 23.8(L) 25.2(L)  Platelets 150 - 400 K/uL 303 276 299    ABG    Component Value Date/Time   PHART 7.352 11/20/2019 1128   PCO2ART 38.8 11/20/2019 1128   PO2ART 164 (H) 11/20/2019 1128   HCO3 21.6 11/20/2019 1128   TCO2 19 (L) 11/20/2019 1507   ACIDBASEDEF 4.0 (H) 11/20/2019 1128   O2SAT 99.0 11/20/2019 1128    CBG (last 3)  Recent Labs    11/25/19 2317 11/26/19 0349 11/26/19 0752  GLUCAP 269* 132* 243*     Critical care time: 32 minutes  Chesley Mires, MD [place] Pager - [phone] 11/26/2019, 9:22 AM

## 2019-11-26 NOTE — Progress Notes (Signed)
Subjective: NAEs o/n  Objective: Vital signs in last 24 hours: Temp:  [99 F (37.2 C)-100.4 F (38 C)] 99.1 F (37.3 C) (06/13 0800) Pulse Rate:  [74-116] 74 (06/13 1400) Resp:  [14-33] 21 (06/13 1400) BP: (124-167)/(51-88) 128/53 (06/13 1400) SpO2:  [100 %] 100 % (06/13 1400) FiO2 (%):  [40 %] 40 % (06/13 1200)  Intake/Output from previous day: 06/12 0701 - 06/13 0700 In: 2989.3 [I.V.:1369.3; NG/GT:1620] Out: 2054 [Urine:1950; Drains:29; Stool:75] Intake/Output this shift: Total I/O In: 706.1 [I.V.:436.1; NG/GT:270] Out: 819 [Urine:750; Drains:18; Stool:51]  Eyes open to voice, PERRL.  FC on right side.  W/d on left side  Lab Results: Recent Labs    11/24/19 0548 11/26/19 0908  WBC 11.4* 7.8  HGB 7.7* 7.7*  HCT 24.3* 25.7*  PLT 303 346   BMET Recent Labs    11/24/19 0548 11/26/19 0908  NA 149* 151*  K 4.3 4.7  CL 121* 126*  CO2 17* 16*  GLUCOSE 168* 257*  BUN 43* 57*  CREATININE 1.27* 1.44*  CALCIUM 9.4 9.6    Studies/Results: DG Chest Port 1 View  Result Date: 11/26/2019 CLINICAL DATA:  Hypoxia EXAM: PORTABLE CHEST 1 VIEW COMPARISON:  November 21, 2019 FINDINGS: Endotracheal tube tip is 3.3 cm above the carina. Nasogastric tube tip and side port are below the diaphragm. No pneumothorax. There is mild left base atelectasis. No edema or airspace opacity. Heart size and pulmonary vascularity are normal. No adenopathy. There is aortic atherosclerosis. Aorta is mildly prominent. IMPRESSION: Tube positions as described without pneumothorax. Mild left base atelectasis. Lungs otherwise clear. Stable cardiac silhouette. Aortic prominence likely is indicative of a degree of chronic hypertension. Aortic Atherosclerosis (ICD10-I70.0). Electronically Signed   By: Lowella Grip III M.D.   On: 11/26/2019 08:23    Assessment/Plan: Ruptured MCA aneurysm s/p clipping - permissive HTN - follow clinically for vasospasm/DND - cont supportive care   Vallarie Mare 11/26/2019, 2:18 PM

## 2019-11-27 ENCOUNTER — Inpatient Hospital Stay (HOSPITAL_COMMUNITY): Payer: Medicare Other

## 2019-11-27 DIAGNOSIS — J9600 Acute respiratory failure, unspecified whether with hypoxia or hypercapnia: Secondary | ICD-10-CM

## 2019-11-27 DIAGNOSIS — I609 Nontraumatic subarachnoid hemorrhage, unspecified: Secondary | ICD-10-CM

## 2019-11-27 DIAGNOSIS — I6012 Nontraumatic subarachnoid hemorrhage from left middle cerebral artery: Secondary | ICD-10-CM | POA: Diagnosis not present

## 2019-11-27 LAB — GLUCOSE, CAPILLARY
Glucose-Capillary: 120 mg/dL — ABNORMAL HIGH (ref 70–99)
Glucose-Capillary: 133 mg/dL — ABNORMAL HIGH (ref 70–99)
Glucose-Capillary: 138 mg/dL — ABNORMAL HIGH (ref 70–99)
Glucose-Capillary: 142 mg/dL — ABNORMAL HIGH (ref 70–99)
Glucose-Capillary: 161 mg/dL — ABNORMAL HIGH (ref 70–99)
Glucose-Capillary: 174 mg/dL — ABNORMAL HIGH (ref 70–99)
Glucose-Capillary: 270 mg/dL — ABNORMAL HIGH (ref 70–99)

## 2019-11-27 LAB — BASIC METABOLIC PANEL
Anion gap: 7 (ref 5–15)
BUN: 54 mg/dL — ABNORMAL HIGH (ref 8–23)
CO2: 16 mmol/L — ABNORMAL LOW (ref 22–32)
Calcium: 9.6 mg/dL (ref 8.9–10.3)
Chloride: 130 mmol/L — ABNORMAL HIGH (ref 98–111)
Creatinine, Ser: 1.38 mg/dL — ABNORMAL HIGH (ref 0.44–1.00)
GFR calc Af Amer: 45 mL/min — ABNORMAL LOW (ref >60)
GFR calc non Af Amer: 39 mL/min — ABNORMAL LOW (ref >60)
Glucose, Bld: 155 mg/dL — ABNORMAL HIGH (ref 70–99)
Potassium: 5.1 mmol/L (ref 3.5–5.1)
Sodium: 153 mmol/L — ABNORMAL HIGH (ref 135–145)

## 2019-11-27 LAB — CBC
HCT: 23.6 % — ABNORMAL LOW (ref 36.0–46.0)
Hemoglobin: 7.2 g/dL — ABNORMAL LOW (ref 12.0–15.0)
MCH: 30.3 pg (ref 26.0–34.0)
MCHC: 30.5 g/dL (ref 30.0–36.0)
MCV: 99.2 fL (ref 80.0–100.0)
Platelets: 370 10*3/uL (ref 150–400)
RBC: 2.38 MIL/uL — ABNORMAL LOW (ref 3.87–5.11)
RDW: 15.2 % (ref 11.5–15.5)
WBC: 7.9 10*3/uL (ref 4.0–10.5)
nRBC: 0 % (ref 0.0–0.2)

## 2019-11-27 NOTE — Progress Notes (Signed)
  NEUROSURGERY PROGRESS NOTE   No issues overnight.   EXAM:  BP (!) 143/59   Pulse 97   Temp 99.7 F (37.6 C) (Axillary)   Resp (!) 22   Ht 5\' 6"  (1.676 m)   Wt 77.8 kg   SpO2 100%   BMI 27.68 kg/m   Intubated, no sedation Opens eyes to voice Intermittently following commands by moving toes b/l.  Not following commands with BUE but moving purposefully to pain Crani bandage in place EVD remains in place, functional, CSF clearing up  IMPRESSION/PLAN 69 y.o. female  d#8, POD#7s/p clipping ruptured LMCA aneurysm.Relatively stable neurologically, waxing and waning. With her inconsistently following commands, will continue EVD drainage for today. - Cont supportive care, nimotop, keppra - permissive HTN -TCD today - Appreciate PCCM assistance

## 2019-11-27 NOTE — Progress Notes (Signed)
Transcranial Doppler  Date POD PCO2 HCT BP  MCA ACA PCA OPHT SIPH VERT Basilar  6/8 JP     Right  Left   51  *   -44  *   23  *   23  25   38  43   *  *   *      6/9 GC     Right  Left   41  *   *  *   26  *   17  17   34  51   *  *   *      6/11 GC     Right  Left   *  68   *  -20   *  36   20  29   32  36   -38  -45   -44      6/14 MR      Right  Left   77  47   -38  *   21  55   *  20   68  48   *  *   *            Right  Left                                            Right  Left                                            Right  Left                                        MCA = Middle Cerebral Artery      OPHT = Opthalmic Artery     BASILAR = Basilar Artery   ACA = Anterior Cerebral Artery     SIPH = Carotid Siphon PCA = Posterior Cerebral Artery   VERT = Verterbral Artery                   Normal MCA = 62+\-12 ACA = 50+\-12 PCA = 42+\-23   * = not insonated. Abram Sander 11/27/2019 2:24 PM

## 2019-11-27 NOTE — Procedures (Signed)
Cortrak  Person Inserting Tube:  Heddy Vidana M, RD Tube Type:  Cortrak - 43 inches Tube Location:  Right nare Initial Placement:  Stomach Secured by: Bridle Technique Used to Measure Tube Placement:  Documented cm marking at nare/ corner of mouth Cortrak Secured At:  69 cm Procedure Comments:  Cortrak Tube Team Note:  Consult received to place a Cortrak feeding tube.   No x-ray is required. RN may begin using tube.   If the tube becomes dislodged please keep the tube and contact the Cortrak team at [email or web address] (password [email or web address]) for replacement.  If after hours and replacement cannot be delayed, place a NG tube and confirm placement with an abdominal x-ray.      Ian Cavey, MS, RD, LDN, CNSC Inpatient Clinical Dietitian RD pager # available in AMION  After hours/weekend pager # available in AMION      

## 2019-11-27 NOTE — Progress Notes (Addendum)
NAME:  Bishop Hill, MRN:  [id number], DOB:  12/04/1950, LOS: 8 ADMISSION DATE:  11/19/2019, CONSULTATION DATE:  11/20/19 REFERRING MD:  Zada Finders, CHIEF COMPLAINT:  SAH    Brief History   69 year old female with PMH significant for seizures, previous CVA's, HTN, and known left MCA aneurysm who reports for sudden unresponsiveness, left-sided facial droop, and right upper extremity weakness. CT Head revealed large volume left sided SAH with 12mm midline shift.   Past Medical History  CVA (No residual deficits) HTN Seizures DM II   Significant Hospital Events   6/06 Admitted to [facility] NeuroICU 6/07 Aneurysm clipped. Acute decompensation - right frontal EVD placed. Intubated for airway protection 6/11 fever 101.3 6/12 change to pressure control 6/14 on PS wean  Consults:  Neurology Neurosurgery  Procedures:  6/7 EVD >> 6/7 ETT >>  6/7 Left pterional craniotomy for clipping of MCA aneurysm  Significant Diagnostic Tests:   6/7 CT Head >> Large volume SAH overlying left cerebral hemisphere with  left to right midline shift. New hydrocephalus of lateral and third vents. SAH reflux into occipital horns of lateral vents.   6/6 CTA Neck >> Patent common and internal carotids without significant stenosis. Mixed plaque within proximal left ICA. Patent vertebrals.   6/6 CTA Head >> 3x2 mm left MCA bifurcation aneurysm (increased in size from April 2020). No intracranial LVO. High grade focal stenosis within inferior division mid M2 left MCA  6/9 renal u/s >> Right renal cysts without complicating factors.  No hydronephrosis.  Micro Data:  6/6 SARS CoV2 >> Negative 6/8 resp >>  Normal oral flora  Antimicrobials:  6/7 Rocephin >> 6/10  Interim history/subjective:  No acute events overnight. Tolerating pressure support well on 10/5 with FiO2 40%.  Objective   Blood pressure (!) 140/51, pulse 87, temperature 99.8 F (37.7 C), temperature source Axillary, resp. rate (!) 22, height 5'  6" (1.676 m), weight 77.8 kg, SpO2 100 %.    Vent Mode: CPAP;PSV FiO2 (%):  [40 %] 40 % Set Rate:  [20 bmp] 20 bmp PEEP:  [5 cmH20] 5 cmH20 Pressure Support:  [10 cmH20] 10 cmH20 Plateau Pressure:  [14 cmH20-17 cmH20] 17 cmH20   Intake/Output Summary (Last 24 hours) at 11/27/2019 0857 Last data filed at 11/27/2019 0800 Gross per 24 hour  Intake 3109.26 ml  Output 2026 ml  Net 1083.26 ml   Filed Weights   11/23/19 0500 11/24/19 0500 11/25/19 0427  Weight: 78 kg 81.6 kg 77.8 kg    Examination:  General - somnolent, intubated elderly woman, resting in bed Eyes - pupils reactive, 34mm bilaterally ENT - ETT in place, protuberant tongue Cardiac - regular rate/rhythm, no murmur, rubs, gallops Chest - CTAB, normal respiratory effort Abdomen - soft, non tender, + bowel sounds Extremities - 2+ pedal edema Skin - no rashes Neuro - awakens and opens eyes, starts to follow commands and falls back asleep, moves UE spontaneously, good tone  Resolved Hospital Problem list     Assessment & Plan:   Acute hypoxic respiratory failure with compromised airway in setting of SAH. - PS wean - Barriers to extubation include mental status, upper airway anatomy and deconditioning; might need trach to assist with vent weaning - goal SpO2 > 92%  Acute left-sided SAH with hydrocephalus, likely ruptured left MCA aneurysm. Hx of seizures. Known prior left MCA aneurysm. Left cerebral hemisphere SAH with right to left midline shift. Right frontal ventriculostomy at 10 cmH20 placed by NSG 6/7  for obtundation and new hydrocephalus. S/p aneurysm clipping 6/7 - Nimotop - Keppra 500mg  BID - TCD today - Continue EVD  Fever. Currently afebrile with peak 100.2 in last day. Likely was central. - Tylenol as needeed - cooling blanket as needed  CKD 2. Non gap metabolic acidosis. - f/u BMET - Continue plasmalyte  HTN. - Goal SBP < 160 - Continue norvasc, hydralazine  DM II poorly controlled with  hyperglycemia. - SSI - Levemir 8u BID - Tube feed coverage  Best practice:  Diet: Tube feeds Pain/Anxiety/Delirium protocol (if indicated): na VAP protocol (if indicated): na DVT prophylaxis: Add Lovenox when OK with NSG GI prophylaxis: Protonix Glucose control: SSI, Levemir Mobility: Bed rest Code Status: Full code Family Communication: Will discuss with NSG and can update family as needed Disposition: Neuro ICU   Critical care time:   Acquanetta Sit, MS4

## 2019-11-28 DIAGNOSIS — I6012 Nontraumatic subarachnoid hemorrhage from left middle cerebral artery: Secondary | ICD-10-CM | POA: Diagnosis not present

## 2019-11-28 DIAGNOSIS — I609 Nontraumatic subarachnoid hemorrhage, unspecified: Secondary | ICD-10-CM | POA: Diagnosis not present

## 2019-11-28 LAB — GLUCOSE, CAPILLARY
Glucose-Capillary: 168 mg/dL — ABNORMAL HIGH (ref 70–99)
Glucose-Capillary: 126 mg/dL — ABNORMAL HIGH (ref 70–99)
Glucose-Capillary: 138 mg/dL — ABNORMAL HIGH (ref 70–99)
Glucose-Capillary: 199 mg/dL — ABNORMAL HIGH (ref 70–99)
Glucose-Capillary: 157 mg/dL — ABNORMAL HIGH (ref 70–99)

## 2019-11-28 MED ORDER — FREE WATER
200.0000 mL | Freq: Three times a day (TID) | Status: DC
  Administered 2019-11-28 – 2019-12-01 (×10): 200 mL

## 2019-11-28 NOTE — Progress Notes (Addendum)
  NEUROSURGERY PROGRESS NOTE   No issues overnight.   EXAM:  BP (!) 116/51   Pulse 86   Temp 99.5 F (37.5 C) (Oral)   Resp (!) 24   Ht 5\' 6"  (1.676 m)   Wt 79.8 kg   SpO2 100%   BMI 28.40 kg/m   Intubated, no sedation Opens eyes to voice Following commands by moving toes b/l, R more brisk than left Not following commands with BUE but moving purposefully to pain Crani bandage in place EVD remains in place, functional, CSF clearing up  IMPRESSION/PLAN 68 y.o. female  d#9, POD#8s/p clipping ruptured LMCA aneurysm.Overall, stable neurologically. Hypernatremic.  - Cont supportive care, nimotop, keppra - permissive HTN -TCDs without evidence of vasospasm - Appreciate PCCM assistance - attempting to reach family for discussion regarding trach - hypernatremia: add 200cc q 8 free water. Trend BMP    I have seen and examined Rebecca Green and agree with the exam, impression, and plan as documented in the note above by [employer], PA-C.  SAH d#9 s/p clipping LMCA aneurysm. Neurologically stable, following commands in all extremities. Exam and TCD remain reassuring - Cont neurologic observation, Nimotop, TCD tomorrow - VDRF Will need trach, no real expectation for extubation and prognosis for recovery is reasonable. - Hypernatremia - will add free water, monitor Na  Rebecca Lose, MD [facility]

## 2019-11-28 NOTE — Progress Notes (Signed)
Patient's daughter, Audelia Acton, called this morning at 0905 to get an update on her mother.  I asked when she would be in so the the physicians could give her an update and discuss trach.  She said she was "right around the corner" and would be her in a few minutes. At 1715, after CCM and NS both tried to reach Gilbert, I called the number from my called ID and Margo's husband answered.  He said she had been running around all day and was napping but that he would have her call me in a few minutes. Jaylenn Altier C 5:17 PM

## 2019-11-28 NOTE — Progress Notes (Signed)
  Fiound out from RN 6:47 PM 11/28/2019 that number for daughter Audelia Acton has been corrected and updaed. CCM is consult. We will call the family 11/29/19 AM during rounds     SIGNATURE    Dr. Brand Males, M.D., F.C.C.P,  Pulmonary and Critical Care Medicine Staff Physician, [place] Director - Interstitial Lung Disease  Program  Pulmonary [place] at [place], [place], [address]  Pager: [phone], If no answer or between  15:00h - 7:00h: call [phone] Telephone: [phone]  6:48 PM 11/28/2019

## 2019-11-28 NOTE — Progress Notes (Signed)
Pt's daughter, Audelia Acton, returned call.  I received an updated contact phone number for her.  It has been changed and is now correct in the system.  I will pass message along to CCM to call her when they are available. Richanda Darin C 5:48 PM

## 2019-11-28 NOTE — Progress Notes (Addendum)
NAME:  Rebecca Green, MRN:  [id number], DOB:  [birth year], LOS: 9 ADMISSION DATE:  11/19/2019, CONSULTATION DATE:  11/20/19 REFERRING MD:  Zada Finders, CHIEF COMPLAINT:  SAH    Brief History   69 year old female with PMH significant for seizures, previous CVA's, HTN, and known left MCA aneurysm who reports for sudden unresponsiveness, left-sided facial droop, and right upper extremity weakness. CT Head revealed large volume left sided SAH with 58mm midline shift.   Past Medical History  CVA (No residual deficits) HTN Seizures DM II   Significant Hospital Events   6/06 Admitted to [facility] NeuroICU 6/07 Aneurysm clipped. Acute decompensation - right frontal EVD placed. Intubated for airway protection 6/11 fever 101.3 6/12 change to pressure control 6/14 on PS wean  Consults:  Neurology Neurosurgery  Procedures:  6/7 EVD >> 6/7 ETT >>  6/7 Left pterional craniotomy for clipping of MCA aneurysm  Significant Diagnostic Tests:   6/7 CT Head >> Large volume SAH overlying left cerebral hemisphere with  left to right midline shift. New hydrocephalus of lateral and third vents. SAH reflux into occipital horns of lateral vents.   6/6 CTA Neck >> Patent common and internal carotids without significant stenosis. Mixed plaque within proximal left ICA. Patent vertebrals.   6/6 CTA Head >> 3x2 mm left MCA bifurcation aneurysm (increased in size from April 2020). No intracranial LVO. High grade focal stenosis within inferior division mid M2 left MCA  6/9 renal u/s >> Right renal cysts without complicating factors.  No hydronephrosis.  Micro Data:  6/6 SARS CoV2 >> Negative 6/8 resp >>  Normal oral flora  Antimicrobials:  6/7 Rocephin >> 6/10  Interim history/subjective:  No acute events overnight. Tolerating pressure support well on 10/5 with FiO2 40%. Opening eyes and moving spontaneously. Responding to commands.  Objective   Blood pressure (!) 102/50, pulse 95, temperature 98.2 F (36.8  C), temperature source Axillary, resp. rate (!) 21, height 5\' 6"  (1.676 m), weight 79.8 kg, SpO2 100 %.    Vent Mode: CPAP;PSV FiO2 (%):  [40 %] 40 % Set Rate:  [20 bmp] 20 bmp PEEP:  [5 cmH20] 5 cmH20 Pressure Support:  [10 cmH20] 10 cmH20 Plateau Pressure:  [15 cmH20-19 cmH20] 19 cmH20   Intake/Output Summary (Last 24 hours) at 11/28/2019 0853 Last data filed at 11/28/2019 0815 Gross per 24 hour  Intake 2458.65 ml  Output 1746 ml  Net 712.65 ml   Filed Weights   11/24/19 0500 11/25/19 0427 11/28/19 0500  Weight: 81.6 kg 77.8 kg 79.8 kg    Examination:  General - somnolent, intubated elderly woman, resting in bed Eyes - pupils reactive, 43mm bilaterally ENT - ETT in place, protuberant tongue Cardiac - regular rate/rhythm, no murmur, rubs, gallops Chest - CTAB, normal respiratory effort Abdomen - soft, non tender, + bowel sounds Extremities - 1+ pedal edema Skin - no rashes Neuro - awakens and opens eyes, following commands, moves extremities spontaneously, good tone, wiggles toes on BLE and shows thumbs on [facility] Problem list     Assessment & Plan:   Acute hypoxic respiratory failure with compromised airway in setting of SAH. - PS wean - Barriers to extubation include mental status, upper airway anatomy and deconditioning; might need trach to assist with vent weaning - Will speak with family about goals of care, trach - goal SpO2 > 92%  Acute left-sided SAH with hydrocephalus, likely ruptured left MCA aneurysm. Hx of seizures. Known prior left MCA  aneurysm. Left cerebral hemisphere SAH with right to left midline shift. Right frontal ventriculostomy at 10 cmH20 placed by NSG 6/7 for obtundation and new hydrocephalus. S/p aneurysm clipping 6/7 - Nimotop - Keppra 500mg  BID - TCD today - Continue EVD  Fever. Currently afebrile with Tmax 99.8. Likely was central. - Tylenol as needeed - cooling blanket as needed  CKD 2. Non gap metabolic acidosis. -  f/u BMET - Continue plasmalyte  Hypernatremia Na 153 on 6/14. - 200cc q8 free water   - Follow BMP  HTN. - Goal SBP < 160 - Continue norvasc, hydralazine  DM II poorly controlled with hyperglycemia. - SSI - Levemir 8u BID - Tube feed coverage  Best practice:  Diet: Tube feeds Pain/Anxiety/Delirium protocol (if indicated): na VAP protocol (if indicated): na DVT prophylaxis: Add Lovenox when OK with NSG GI prophylaxis: Protonix Glucose control: SSI, Levemir Mobility: Bed rest Code Status: Full code Family Communication: will speak with family about goals of care, trach Disposition: Neuro ICU   Critical care time:   Acquanetta Sit, MS4

## 2019-11-29 ENCOUNTER — Inpatient Hospital Stay (HOSPITAL_COMMUNITY): Payer: Medicare Other

## 2019-11-29 DIAGNOSIS — I609 Nontraumatic subarachnoid hemorrhage, unspecified: Secondary | ICD-10-CM

## 2019-11-29 DIAGNOSIS — I6012 Nontraumatic subarachnoid hemorrhage from left middle cerebral artery: Secondary | ICD-10-CM | POA: Diagnosis not present

## 2019-11-29 LAB — GLUCOSE, CAPILLARY
Glucose-Capillary: 112 mg/dL — ABNORMAL HIGH (ref 70–99)
Glucose-Capillary: 121 mg/dL — ABNORMAL HIGH (ref 70–99)
Glucose-Capillary: 122 mg/dL — ABNORMAL HIGH (ref 70–99)
Glucose-Capillary: 125 mg/dL — ABNORMAL HIGH (ref 70–99)
Glucose-Capillary: 155 mg/dL — ABNORMAL HIGH (ref 70–99)
Glucose-Capillary: 186 mg/dL — ABNORMAL HIGH (ref 70–99)
Glucose-Capillary: 193 mg/dL — ABNORMAL HIGH (ref 70–99)

## 2019-11-29 LAB — BASIC METABOLIC PANEL
Anion gap: 8 (ref 5–15)
BUN: 65 mg/dL — ABNORMAL HIGH (ref 8–23)
CO2: 15 mmol/L — ABNORMAL LOW (ref 22–32)
Calcium: 9.5 mg/dL (ref 8.9–10.3)
Chloride: 128 mmol/L — ABNORMAL HIGH (ref 98–111)
Creatinine, Ser: 1.56 mg/dL — ABNORMAL HIGH (ref 0.44–1.00)
GFR calc Af Amer: 39 mL/min — ABNORMAL LOW (ref >60)
GFR calc non Af Amer: 33 mL/min — ABNORMAL LOW (ref >60)
Glucose, Bld: 252 mg/dL — ABNORMAL HIGH (ref 70–99)
Potassium: 6.2 mmol/L — ABNORMAL HIGH (ref 3.5–5.1)
Sodium: 151 mmol/L — ABNORMAL HIGH (ref 135–145)

## 2019-11-29 LAB — CBC
HCT: 22.9 % — ABNORMAL LOW (ref 36.0–46.0)
HCT: 23 % — ABNORMAL LOW (ref 36.0–46.0)
Hemoglobin: 7 g/dL — ABNORMAL LOW (ref 12.0–15.0)
Hemoglobin: 6.8 g/dL — CL (ref 12.0–15.0)
MCH: 30 pg (ref 26.0–34.0)
MCH: 30.2 pg (ref 26.0–34.0)
MCHC: 30.6 g/dL (ref 30.0–36.0)
MCHC: 29.6 g/dL — ABNORMAL LOW (ref 30.0–36.0)
MCV: 98.3 fL (ref 80.0–100.0)
MCV: 102.2 fL — ABNORMAL HIGH (ref 80.0–100.0)
Platelets: 460 10*3/uL — ABNORMAL HIGH (ref 150–400)
Platelets: 431 10*3/uL — ABNORMAL HIGH (ref 150–400)
RBC: 2.33 MIL/uL — ABNORMAL LOW (ref 3.87–5.11)
RBC: 2.25 MIL/uL — ABNORMAL LOW (ref 3.87–5.11)
RDW: 15.8 % — ABNORMAL HIGH (ref 11.5–15.5)
RDW: 15.9 % — ABNORMAL HIGH (ref 11.5–15.5)
WBC: 10.9 10*3/uL — ABNORMAL HIGH (ref 4.0–10.5)
WBC: 10 10*3/uL (ref 4.0–10.5)
nRBC: 0 % (ref 0.0–0.2)
nRBC: 0 % (ref 0.0–0.2)

## 2019-11-29 LAB — HEPATIC FUNCTION PANEL
ALT: 34 U/L (ref 0–44)
AST: 22 U/L (ref 15–41)
Albumin: 2.2 g/dL — ABNORMAL LOW (ref 3.5–5.0)
Alkaline Phosphatase: 117 U/L (ref 38–126)
Bilirubin, Direct: <0.1 mg/dL (ref 0.0–0.2)
Total Bilirubin: 0.4 mg/dL (ref 0.3–1.2)
Total Protein: 6.1 g/dL — ABNORMAL LOW (ref 6.5–8.1)

## 2019-11-29 LAB — PROTIME-INR
INR: 1.1 (ref 0.8–1.2)
Prothrombin Time: 13.9 seconds (ref 11.4–15.2)

## 2019-11-29 MED ORDER — MIDAZOLAM HCL 2 MG/2ML IJ SOLN
5.0000 mg | Freq: Once | INTRAMUSCULAR | Status: AC
  Administered 2019-11-30: 2 mg via INTRAVENOUS
  Filled 2019-11-29: qty 6

## 2019-11-29 MED ORDER — SODIUM ZIRCONIUM CYCLOSILICATE 10 G PO PACK
10.0000 g | PACK | Freq: Once | ORAL | Status: AC
  Administered 2019-11-29: 10 g
  Filled 2019-11-29: qty 1

## 2019-11-29 MED ORDER — FENTANYL CITRATE (PF) 100 MCG/2ML IJ SOLN: 200.0000 ug | Freq: Once | INTRAMUSCULAR | Status: DC

## 2019-11-29 MED ORDER — ETOMIDATE 2 MG/ML IV SOLN
40.0000 mg | Freq: Once | INTRAVENOUS | Status: AC
  Administered 2019-11-30: 40 mg via INTRAVENOUS
  Filled 2019-11-29: qty 20

## 2019-11-29 MED ORDER — PROPOFOL 10 MG/ML IV BOLUS: 500.0000 mg | Freq: Once | INTRAVENOUS | Status: AC

## 2019-11-29 MED ORDER — SODIUM ZIRCONIUM CYCLOSILICATE 10 G PO PACK
10.0000 g | PACK | Freq: Once | ORAL | Status: DC
  Filled 2019-11-29 (×2): qty 1

## 2019-11-29 MED ORDER — SODIUM CHLORIDE 0.9% IV SOLUTION: Freq: Once | INTRAVENOUS | Status: AC

## 2019-11-29 MED ORDER — VECURONIUM BROMIDE 10 MG IV SOLR
10.0000 mg | Freq: Once | INTRAVENOUS | Status: AC
  Administered 2019-11-30: 10 mg via INTRAVENOUS
  Filled 2019-11-29: qty 10

## 2019-11-29 NOTE — Progress Notes (Signed)
Transcranial Doppler  Date POD PCO2 HCT BP  MCA ACA PCA OPHT SIPH VERT Basilar  6/8 JP     Right  Left   51  *   -44  *   23  *   23  25   38  43   *  *   *      6/9 GC     Right  Left   41  *   *  *   26  *   17  17   34  51   *  *   *      6/11 GC     Right  Left   *  68   *  -20   *  36   20  29   32  36   -38  -45   -44      6/14 MR      Right  Left   77  47   -38  *   21  55   *  20   68  48   *  *   *       6/16 JP     Right  Left   90  95   -76  -53   36  23   13  24    88  97   -63  -42   -53           Right  Left                                            Right  Left                                        MCA = Middle Cerebral Artery      OPHT = Opthalmic Artery     BASILAR = Basilar Artery   ACA = Anterior Cerebral Artery     SIPH = Carotid Siphon PCA = Posterior Cerebral Artery   VERT = Verterbral Artery                   Normal MCA = 62+\-12 ACA = 50+\-12 PCA = 42+\-23   * = not insonated. June Leap, BS, RDMS, RVT  11/29/2019 12:53 PM

## 2019-11-29 NOTE — Progress Notes (Signed)
  NEUROSURGERY PROGRESS NOTE   No issues overnight.   EXAM:  BP (!) 164/66   Pulse 96   Temp 99.7 F (37.6 C) (Axillary)   Resp (!) 27   Ht 5\' 6"  (1.676 m)   Wt 79.6 kg   SpO2 100%   BMI 28.32 kg/m   Intubated, no sedation Opens eyes to voice Following commands with all extremities Crani bandage in place EVD remains in place, functional, CSF clearing up  IMPRESSION/PLAN 69 y.o. female d#10, POD#9s/p clipping ruptured LMCA aneurysm.stable neurologically. Hypernatremic.  - Cont supportive care, nimotop, keppra -permissive HTN -TCD today - attempting to reach family for discussion regarding trach - hypernatremia: add 200cc q 8 free water. Trend BMP. Pending this am - Appreciate PCCM assistance

## 2019-11-29 NOTE — Progress Notes (Signed)
Attempted to call [employer] pt's daughter to get blood consent, no answer so a voicemail was left asking for a call back. Per E-link if the daughter does not call back consent and blood can wait until the morning.

## 2019-11-29 NOTE — Progress Notes (Signed)
NAME:  Rebecca Green, MRN:  [id number], DOB:  12-07-1950, LOS: 71 ADMISSION DATE:  11/19/2019, CONSULTATION DATE:  11/20/19 REFERRING MD:  Zada Finders, CHIEF COMPLAINT:  SAH    Brief History   69 year old female with PMH significant for seizures, previous CVA's, HTN, and known left MCA aneurysm who reports for sudden unresponsiveness, left-sided facial droop, and right upper extremity weakness. CT Head revealed large volume left sided SAH with 60mm midline shift.   Past Medical History  CVA (No residual deficits) HTN Seizures DM II   Significant Hospital Events   6/06 Admitted to [facility] 6/07 Aneurysm clipped. Acute decompensation - right frontal EVD placed. Intubated for airway protection 6/11 fever 101.3 6/12 change to pressure control 6/14 on PS wean  Consults:  Neurology Neurosurgery  Procedures:  6/7 EVD >> 6/7 ETT >>  6/7 Left pterional craniotomy for clipping of MCA aneurysm  Significant Diagnostic Tests:   6/7 CT Head >> Large volume SAH overlying left cerebral hemisphere with  left to right midline shift. New hydrocephalus of lateral and third vents. SAH reflux into occipital horns of lateral vents.   6/6 CTA Neck >> Patent common and internal carotids without significant stenosis. Mixed plaque within proximal left ICA. Patent vertebrals.   6/6 CTA Head >> 3x2 mm left MCA bifurcation aneurysm (increased in size from April 2020). No intracranial LVO. High grade focal stenosis within inferior division mid M2 left MCA  6/9 renal u/s >> Right renal cysts without complicating factors.  No hydronephrosis.  Micro Data:  6/6 SARS CoV2 >> Negative 6/8 resp >>  Normal oral flora  Antimicrobials:  6/7 Rocephin >> 6/10  Interim history/subjective:  No acute events overnight. Tolerating pressure support well on 10/5 with FiO2 40%. Woke to gentle touch this morning and opened eyes. Not following commands.  Objective   Blood pressure 135/67, pulse 99, temperature 99.7  F (37.6 C), temperature source Axillary, resp. rate (!) 30, height 5\' 6"  (1.676 m), weight 79.6 kg, SpO2 100 %.    Vent Mode: PSV;CPAP FiO2 (%):  [40 %] 40 % Set Rate:  [20 bmp] 20 bmp PEEP:  [5 cmH20] 5 cmH20 Pressure Support:  [10 cmH20] 10 cmH20 Plateau Pressure:  [16 cmH20-30 cmH20] 19 cmH20   Intake/Output Summary (Last 24 hours) at 11/29/2019 1000 Last data filed at 11/29/2019 0900 Gross per 24 hour  Intake 2382.16 ml  Output 2201 ml  Net 181.16 ml   Filed Weights   11/25/19 0427 11/28/19 0500 11/29/19 0500  Weight: 77.8 kg 79.8 kg 79.6 kg    Examination:  General - somnolent, intubated elderly woman, resting in bed Eyes - pupils reactive, 55mm bilaterally ENT - ETT in place, protuberant tongue Cardiac - regular rate/rhythm, no murmur, rubs, gallops Chest - CTAB, normal respiratory effort Abdomen - soft, non tender, + bowel sounds Extremities - 1+ pedal edema Skin - no rashes Neuro - awakens and opens eyes, not following commands due to falling back asleep, moves extremities spontaneously, good tone  Resolved Hospital Problem list     Assessment & Plan:   Acute hypoxic respiratory failure with compromised airway in setting of SAH. - PS wean - Barriers to extubation include mental status, upper airway anatomy and deconditioning; might need trach to assist with vent weaning - Planning to speak with family about goals of care, trach during rounds this AM - goal SpO2 > 92%  Acute left-sided SAH with hydrocephalus, likely ruptured left MCA aneurysm. Hx of  seizures. Known prior left MCA aneurysm. Left cerebral hemisphere SAH with right to left midline shift. Right frontal ventriculostomy at 10 cmH20 placed by NSG 6/7 for obtundation and new hydrocephalus. S/p aneurysm clipping 6/7 - Nimotop - Keppra 500mg  BID - TCDs - Continue EVD  Fever. Currently afebrile with Tmax 99.7. Likely was central.  - Tylenol as needeed - cooling blanket as needed  CKD 2. Non gap  metabolic acidosis. - f/u BMET - Continue plasmalyte  Hypernatremia Na 153 on 6/14. - 200cc q8 free water   - Follow BMP  HTN. - Goal SBP < 160 - Continue norvasc, hydralazine  DM II poorly controlled with hyperglycemia. - SSI - Levemir 8u BID - Tube feed coverage  Best practice:  Diet: Tube feeds Pain/Anxiety/Delirium protocol (if indicated): na VAP protocol (if indicated): na DVT prophylaxis: Add Lovenox when OK with NSG GI prophylaxis: Protonix Glucose control: SSI, Levemir Mobility: Bed rest Code Status: Full code Family Communication: will speak with family about goals of care, trach Disposition: Neuro ICU   Critical care time:   Acquanetta Sit, MS4

## 2019-11-29 NOTE — Progress Notes (Signed)
Brief Progress Note  Acute Respiratory Failure in setting of SAH Dr. Chase Caller spoke with [address] patient's daughter, by phone this AM. Discussed trach and answered all questions. Daughter voiced understanding and is OK to proceed with trach.   Dr. Chase Caller also spoke with Lexine Baton, RT, and planning for tracheostomy tomorrow (6/17) at 11am.   Hyperkalemia K came back at 6.2  - Will discontinue plasmalyte. Only other fluid source is free water - Repeat BMET at 22:00 - Sodium zirconium 10g  AKI/CKD Renal function is worsened compared to last labs. Cr 1.56 (from 1.38).  - Trend BMET as above  Anemia Hb is 7.0. Has remained in the low 7s over the last few days. Will need to transfuse if <7. - Type and screen   Leukocytosis Patient has been afebrile, but increase in WBC to 10.9 (from 7.9), platelets at 460. Concern for possible infection. - Recheck CBC at midnight - Consider CXR, UA

## 2019-11-29 NOTE — Progress Notes (Signed)
CRITICAL VALUE ALERT  Critical Value:  hgb 6.8  Date & Time Notied:  11/29/2019 2240  Provider Notified: E-link  Orders Received/Actions taken: awaiting orders

## 2019-11-29 NOTE — Progress Notes (Signed)
eLink Physician-Brief Progress Note Patient Name: Rebecca Green DOB: 1951/06/04 MRN: [id number]   Date of Service  11/29/2019  HPI/Events of Note  Anemia - Hgb = 6.8.   eICU Interventions  Transfuse 1 unit PRBC.     Intervention Category Major Interventions: Other:  Lysle Dingwall 11/29/2019, 10:55 PM

## 2019-11-30 ENCOUNTER — Inpatient Hospital Stay (HOSPITAL_COMMUNITY): Payer: Medicare Other

## 2019-11-30 DIAGNOSIS — I340 Nonrheumatic mitral (valve) insufficiency: Secondary | ICD-10-CM

## 2019-11-30 DIAGNOSIS — I6012 Nontraumatic subarachnoid hemorrhage from left middle cerebral artery: Secondary | ICD-10-CM | POA: Diagnosis not present

## 2019-11-30 DIAGNOSIS — I609 Nontraumatic subarachnoid hemorrhage, unspecified: Secondary | ICD-10-CM | POA: Diagnosis not present

## 2019-11-30 LAB — BASIC METABOLIC PANEL
Anion gap: 7 (ref 5–15)
Anion gap: 9 (ref 5–15)
BUN: 67 mg/dL — ABNORMAL HIGH (ref 8–23)
BUN: 67 mg/dL — ABNORMAL HIGH (ref 8–23)
CO2: 15 mmol/L — ABNORMAL LOW (ref 22–32)
CO2: 17 mmol/L — ABNORMAL LOW (ref 22–32)
Calcium: 9.6 mg/dL (ref 8.9–10.3)
Calcium: 9.6 mg/dL (ref 8.9–10.3)
Chloride: 128 mmol/L — ABNORMAL HIGH (ref 98–111)
Chloride: 125 mmol/L — ABNORMAL HIGH (ref 98–111)
Creatinine, Ser: 1.47 mg/dL — ABNORMAL HIGH (ref 0.44–1.00)
Creatinine, Ser: 1.68 mg/dL — ABNORMAL HIGH (ref 0.44–1.00)
GFR calc Af Amer: 41 mL/min — ABNORMAL LOW (ref >60)
GFR calc Af Amer: 35 mL/min — ABNORMAL LOW (ref >60)
GFR calc non Af Amer: 36 mL/min — ABNORMAL LOW (ref >60)
GFR calc non Af Amer: 30 mL/min — ABNORMAL LOW (ref >60)
Glucose, Bld: 214 mg/dL — ABNORMAL HIGH (ref 70–99)
Glucose, Bld: 170 mg/dL — ABNORMAL HIGH (ref 70–99)
Potassium: 5.9 mmol/L — ABNORMAL HIGH (ref 3.5–5.1)
Potassium: 5.2 mmol/L — ABNORMAL HIGH (ref 3.5–5.1)
Sodium: 150 mmol/L — ABNORMAL HIGH (ref 135–145)
Sodium: 151 mmol/L — ABNORMAL HIGH (ref 135–145)

## 2019-11-30 LAB — COMPREHENSIVE METABOLIC PANEL
ALT: 30 U/L (ref 0–44)
AST: 19 U/L (ref 15–41)
Albumin: 2.3 g/dL — ABNORMAL LOW (ref 3.5–5.0)
Alkaline Phosphatase: 126 U/L (ref 38–126)
Anion gap: 8 (ref 5–15)
BUN: 63 mg/dL — ABNORMAL HIGH (ref 8–23)
CO2: 16 mmol/L — ABNORMAL LOW (ref 22–32)
Calcium: 9.7 mg/dL (ref 8.9–10.3)
Chloride: 126 mmol/L — ABNORMAL HIGH (ref 98–111)
Creatinine, Ser: 1.42 mg/dL — ABNORMAL HIGH (ref 0.44–1.00)
GFR calc Af Amer: 43 mL/min — ABNORMAL LOW (ref >60)
GFR calc non Af Amer: 37 mL/min — ABNORMAL LOW (ref >60)
Glucose, Bld: 209 mg/dL — ABNORMAL HIGH (ref 70–99)
Potassium: 5.7 mmol/L — ABNORMAL HIGH (ref 3.5–5.1)
Sodium: 150 mmol/L — ABNORMAL HIGH (ref 135–145)
Total Bilirubin: 0.2 mg/dL — ABNORMAL LOW (ref 0.3–1.2)
Total Protein: 6.1 g/dL — ABNORMAL LOW (ref 6.5–8.1)

## 2019-11-30 LAB — HEMOGLOBIN AND HEMATOCRIT, BLOOD
HCT: 27.2 % — ABNORMAL LOW (ref 36.0–46.0)
Hemoglobin: 8.3 g/dL — ABNORMAL LOW (ref 12.0–15.0)

## 2019-11-30 LAB — GLUCOSE, CAPILLARY
Glucose-Capillary: 117 mg/dL — ABNORMAL HIGH (ref 70–99)
Glucose-Capillary: 195 mg/dL — ABNORMAL HIGH (ref 70–99)
Glucose-Capillary: 162 mg/dL — ABNORMAL HIGH (ref 70–99)
Glucose-Capillary: 123 mg/dL — ABNORMAL HIGH (ref 70–99)
Glucose-Capillary: 130 mg/dL — ABNORMAL HIGH (ref 70–99)
Glucose-Capillary: 106 mg/dL — ABNORMAL HIGH (ref 70–99)

## 2019-11-30 LAB — PREPARE RBC (CROSSMATCH)

## 2019-11-30 LAB — PROTIME-INR
INR: 1.1 (ref 0.8–1.2)
Prothrombin Time: 13.7 seconds (ref 11.4–15.2)

## 2019-11-30 LAB — TRIGLYCERIDES: Triglycerides: 145 mg/dL (ref <150)

## 2019-11-30 LAB — ECHOCARDIOGRAM COMPLETE
Height: 66 in
Weight: 2807.78 oz

## 2019-11-30 LAB — MAGNESIUM: Magnesium: 2.7 mg/dL — ABNORMAL HIGH (ref 1.7–2.4)

## 2019-11-30 LAB — PHOSPHORUS: Phosphorus: 5.1 mg/dL — ABNORMAL HIGH (ref 2.5–4.6)

## 2019-11-30 MED ORDER — SODIUM ZIRCONIUM CYCLOSILICATE 10 G PO PACK
10.0000 g | PACK | Freq: Once | ORAL | Status: AC
  Administered 2019-11-30: 10 g
  Filled 2019-11-30: qty 1

## 2019-11-30 MED ORDER — INSULIN ASPART 100 UNIT/ML ~~LOC~~ SOLN
3.0000 [IU] | SUBCUTANEOUS | Status: DC
  Administered 2019-11-30 (×2): 3 [IU] via SUBCUTANEOUS
  Administered 2019-11-30: 6 [IU] via SUBCUTANEOUS
  Administered 2019-12-01: 3 [IU] via SUBCUTANEOUS
  Administered 2019-12-02: 6 [IU] via SUBCUTANEOUS
  Administered 2019-12-02 – 2019-12-03 (×3): 3 [IU] via SUBCUTANEOUS
  Administered 2019-12-03 – 2019-12-04 (×2): 6 [IU] via SUBCUTANEOUS

## 2019-11-30 MED ORDER — PROPOFOL 1000 MG/100ML IV EMUL
5.0000 ug/kg/min | INTRAVENOUS | Status: DC
  Administered 2019-11-30: 30 ug/kg/min via INTRAVENOUS

## 2019-11-30 MED ORDER — VECURONIUM BROMIDE 10 MG IV SOLR
INTRAVENOUS | Status: AC
  Filled 2019-11-30: qty 10

## 2019-11-30 MED ORDER — DEXTROSE 10 % IV SOLN: INTRAVENOUS | Status: DC

## 2019-11-30 MED ORDER — ETOMIDATE 2 MG/ML IV SOLN
INTRAVENOUS | Status: AC
  Filled 2019-11-30: qty 20

## 2019-11-30 MED ORDER — SODIUM ZIRCONIUM CYCLOSILICATE 10 G PO PACK
10.0000 g | PACK | Freq: Once | ORAL | Status: DC
  Filled 2019-11-30: qty 1

## 2019-11-30 MED ORDER — INSULIN DETEMIR 100 UNIT/ML ~~LOC~~ SOLN
0.1500 [IU]/kg | Freq: Two times a day (BID) | SUBCUTANEOUS | Status: DC
  Administered 2019-11-30 – 2019-12-03 (×7): 12 [IU] via SUBCUTANEOUS
  Filled 2019-11-30 (×9): qty 0.12

## 2019-11-30 MED ORDER — FUROSEMIDE 10 MG/ML IJ SOLN
20.0000 mg | Freq: Once | INTRAMUSCULAR | Status: AC
  Administered 2019-11-30: 20 mg via INTRAVENOUS
  Filled 2019-11-30: qty 2

## 2019-11-30 MED ORDER — PROPOFOL 1000 MG/100ML IV EMUL
INTRAVENOUS | Status: AC
  Administered 2019-11-30: 20 mg via INTRAVENOUS
  Filled 2019-11-30: qty 100

## 2019-11-30 MED ORDER — INSULIN ASPART 100 UNIT/ML ~~LOC~~ SOLN
4.0000 [IU] | SUBCUTANEOUS | Status: DC
  Administered 2019-11-30 – 2019-12-04 (×20): 4 [IU] via SUBCUTANEOUS

## 2019-11-30 MED ORDER — IPRATROPIUM-ALBUTEROL 0.5-2.5 (3) MG/3ML IN SOLN
3.0000 mL | RESPIRATORY_TRACT | Status: DC
  Administered 2019-11-30 – 2019-12-06 (×35): 3 mL via RESPIRATORY_TRACT
  Filled 2019-11-30 (×36): qty 3

## 2019-11-30 MED ORDER — SODIUM ZIRCONIUM CYCLOSILICATE 10 G PO PACK
10.0000 g | PACK | Freq: Once | ORAL | Status: AC
  Administered 2019-12-01: 10 g
  Filled 2019-11-30: qty 1

## 2019-11-30 MED ORDER — PHENYLEPHRINE 40 MCG/ML (10ML) SYRINGE FOR IV PUSH (FOR BLOOD PRESSURE SUPPORT)
PREFILLED_SYRINGE | INTRAVENOUS | Status: AC
  Filled 2019-11-30: qty 10

## 2019-11-30 NOTE — Op Note (Addendum)
[place]  Date of admit: 11/19/2019  LOS: 11 days  Date of Procedure: 11/30/2019            PROCEDURE  NOTE   FLEXIBLE VIDEO BRONCHOSCOPY FOR PERCUTANEOUS DILATATIONAL TRACHEOSTOMY     OPERATOR  1. Dr. Brand Males - Flexible Bronchoscopy  2. Dr Leory Plowman Icard - Percutaneous Dilatational Tracheostomy    RISKS  Risks of pneumothorax, hemothorax, sedation/anesthesia complications such as cardiac or respiratory arrest or hypotension, stroke and bleeding all explained. Benefits of diagnosis but limitations of non-diagnosis also explained. Patient/family verbalized understanding and wished to proceed.   CONSENT  Signed informed consent from family member - over phone     PROCEDURE DETAILS  Procedure done by Dr Chase Caller on 11/30/2019  After ensuring adequate anesthesia, at first bronch was introduce through ET tube and structures of tracheal rings, carina identified for operator of tracheostomy listed above.Sunday Corn of bronch passed through trachea and skin for indentification of tracheal rings for tracheostomy puncture. After this, under bronchoscopy guidance, ET tube was pulled back sufficiently and very carefully. The ET tube was pulled back enough to give room for tracheostomy operator and yet at same time to to ensure a secured airway. After this was accomplished, bronchoscope was withdrawn into the ET tube. After this, The operator of tracheostomy listed above, performed tracheostomy under video visual provided by flexible video bronchoscopy. Followng introduction of tracheostomy, the bronchoscope was removed from ET tube and introduced through tracheostomy. Correct position of tracheostomy was ensured, with enough room between carina and distal tracheostomy and no evidence of bleeding. The bronchoscope was then withdrawn. Respiratory therapist was then instructed to remove the ET tube. Tracheostomy operator listed above then proceeded to complete the tracheostomy with stay sutures     Tracheal aspirate sent for culture  COMPLICATIONS  No complications    Dr. Brand Males, M.D., [facility]  Pulmonary and Critical Care Medicine  Staff Physician  [facility]  Pager: [phone], If no answer or between 15:00h - 7:00h: call [phone]  11/30/2019 and 12:14 PM

## 2019-11-30 NOTE — Progress Notes (Signed)
PT Cancellation Note  Patient Details Name: Rebecca Green MRN: [id number] DOB: [birth year]   Cancelled Treatment:    Reason Eval/Treat Not Completed: Patient not medically ready. Per discussion with RN patient received a paralytic, unable to actively participate in PT session at this time. PT will follow up when patient able to better participate in session.   Zenaida Niece 11/30/2019, 3:30 PM

## 2019-11-30 NOTE — Procedures (Signed)
Procedure: Percutaneous Tracheostomy CPT 31600 Performed by: Garner Nash, DO  Bronchoscopy Assistant: Brand Males, MD .  Indications: Chronic respiratory failure and need for ongoing mechanical ventilation.  Consent: Signed in chart  Preprocedure: Universal protocol was followed for this procedure. Timeout performed. Anterior neck prepped and draped.  Anesthesia: The patient was intubated and sedated prior to the procedure. Additional midazolam, etomidate, fentanyl and vecuronium were given for sedation and paralysis with close attention to vital signs throughout procedure.   Procedure: The patient was placed in the supine position. The anterior neck was prepped and draped in usual sterile fashion. 1% lidocaine was administered approximately 2 fingerbreadths above the sternal notch for local anesthesia. A 1.5-cm vertical incision was then performed 2 fingerbreadths above the sternal notch. Using a curved Kelly, blunt dissection was performed down to the level of the pretracheal fascia. At this point, the bronchoscope was introduced through the endotracheal tube and the trachea was properly visualized. The endotracheal tube was then gradually withdrawn within the trachea under direct bronchoscopic visualization. Proper midline position was confirmed by bouncing the needle from the tracheostomy tray over the trachea with bronchoscopic examination. The needle was advanced into the trachea and proper positioning was confirmed with direct visualization. The needle was then removed leaving a white outer cannula in position. The wire from the tracheostomy tray was then advanced through the white outer cannula. The cannula was then removed. The small, blue dilator was then advanced over the wire into the trachea. Once proper dilatation was achieved, the dilator was removed. The large, tapered dilator was then advanced over the wire into the trachea. The dilator was removed leaving the wire and white  inner cannula in position. A number 6  percutaneous Shiley tracheostomy tube was then advanced over the wire and white inner cannula was not visualized therefore it was removed and a Shiley 6 distal XLT was inserted into the trachea. Proper positioning was confirmed with bronchoscopic visualization. The tracheostomy tube was then sutured in place with four nylon sutures. It was further secured with a tracheostomy tie.  Estimated blood loss: Less than 5 mL.  Complications: None immediate.  CXR ordered.  Garner Nash, DO [facility] 11/30/2019 12:19 PM

## 2019-11-30 NOTE — Progress Notes (Signed)
Nutrition Follow-up  DOCUMENTATION CODES:   Not applicable  INTERVENTION:   Tube feeding via cortrak tube: Jevity 1.2 @ 45 ml/h (1080 ml per day) Pro-stat 60 ml BID  Provides 1696 kcal, 120 gm protein, 875 ml free water daily  200 ml free water every 8 hours  Total free water: 1680 ml   NUTRITION DIAGNOSIS:   Inadequate oral intake related to inability to eat as evidenced by NPO status. Ongoing.   GOAL:   Provide needs based on ASPEN/SCCM guidelines Meeting with TF  MONITOR:   TF tolerance, Vent status, Labs  REASON FOR ASSESSMENT:   Consult, Ventilator Enteral/tube feeding initiation and management  ASSESSMENT:   Pt with PMH of seizures, previous CVA's, HTN, DM, and known L MCA aneurysm who was admitted with large L SAH from ruptured L MCA aneurysm.   EVD remains in place. Trach today. Lokelma for hyperkalemia. Plasmaslyte stopped.   6/7 s/p crani for clipping and EVD placement  6/14 cortrak placed 6/17 trach planned  Patient is currently intubated on ventilator support MV: 10.9 L/min Temp (24hrs), Avg:99.3 F (37.4 C), Min:99 F (37.2 C), Max:99.7 F (37.6 C)  Medications reviewed and include: colace, 2-9 units novolog every 4 hours, 4 units novolog every 4 hours, .15 units/kg levemir every 12 hours, MVI with minerals, miralax, senokot Labs reviewed: hgbA1C: 7.8 (H); Na 150 (H), K+ 5.7 (H), PO4: 5.1 (H) CBG's: [phone]  EVD: 71 ml    Diet Order:   Diet Order            Diet NPO time specified  Diet effective midnight                 EDUCATION [id number]:   No education needs have been identified at this time  Skin:  Skin Assessment: Reviewed RN Assessment  Last BM:  100 ml via rectal tube  Height:   Ht Readings from Last 1 Encounters:  11/19/19 5\' 6"  (1.676 m)    Weight:   Wt Readings from Last 1 Encounters:  11/29/19 79.6 kg    Ideal Body Weight:  59 kg  BMI:  Body mass index is 28.32 kg/m.  Estimated Nutritional  Needs:   Kcal:  8088  Protein:  110-125 grams  Fluid:  2 L/day  Lockie Pares., RD, LDN, CNSC See AMiON for contact information

## 2019-11-30 NOTE — Progress Notes (Signed)
CCM MD present at bedside, prepared for tracheostomy procedure. Time out for the procedure was completed and all present in room consented, consent form has been signed and completed as well. MD then requested 2 mg versed, 100 mcg fentanyl to be given via PIV. RN administered versed, fentanyl and propofol infusion via L AC PIV. 20 mg of etomidate was then administered followed by 10mg of vecuronium. Pt was still moving and agitated after administration of the paralytic. Another 20 mg of etomidate was given via the same PIV. Pt continued to move around. It was suggested that the PIV may not be correctly in the vein as pt was edematous around the catheter site. A new RSI kit was removed from the unit pyxis and another 20 mg of etomidate was given via the midline IV followed by 10mg of vec. The propofol infusion was then switched to the midline catheter and resumed. VSS throughout. 

## 2019-11-30 NOTE — Progress Notes (Addendum)
NAME:  [place], MRN:  [id number], DOB:  July 24, 1950, LOS: 29 ADMISSION DATE:  11/19/2019, CONSULTATION DATE:  11/20/19 REFERRING MD:  Zada Finders, CHIEF COMPLAINT:  SAH    Brief History   69 year old female with PMH significant for seizures, previous CVA's, HTN, and known left MCA aneurysm who reports for sudden unresponsiveness, left-sided facial droop, and right upper extremity weakness. CT Head revealed large volume left sided SAH with 19mm midline shift.   Past Medical History  CVA (No residual deficits) HTN Seizures DM II   Significant Hospital Events   6/06 Admitted to [facility] 6/07 Aneurysm clipped. Acute decompensation - right frontal EVD placed. Intubated for airway protection 6/11 fever 101.3 6/12 change to pressure control 6/14 on PS wean  Consults:  Neurology Neurosurgery  Procedures:  6/7 EVD >> 6/7 ETT >>  6/7 Left pterional craniotomy for clipping of MCA aneurysm  Significant Diagnostic Tests:   6/7 CT Head >> Large volume SAH overlying left cerebral hemisphere with  left to right midline shift. New hydrocephalus of lateral and third vents. SAH reflux into occipital horns of lateral vents.   6/6 CTA Neck >> Patent common and internal carotids without significant stenosis. Mixed plaque within proximal left ICA. Patent vertebrals.   6/6 CTA Head >> 3x2 mm left MCA bifurcation aneurysm (increased in size from April 2020). No intracranial LVO. High grade focal stenosis within inferior division mid M2 left MCA  6/9 renal u/s >> Right renal cysts without complicating factors.  No hydronephrosis.  Micro Data:  6/6 SARS CoV2 >> Negative 6/8 resp >>  Normal oral flora  Antimicrobials:  6/7 Rocephin >> 6/10  Interim history/subjective:  No acute events overnight. On ventilator and no respiratory distress noted. Awake and moving extremities spontaneously. Not following commands.  Objective   Blood pressure (!) 150/62, pulse 82, temperature 99.4 F (37.4  C), temperature source Axillary, resp. rate (!) 21, height 5\' 6"  (1.676 m), weight 79.6 kg, SpO2 100 %.    Vent Mode: PCV FiO2 (%):  [40 %] 40 % Set Rate:  [20 bmp] 20 bmp PEEP:  [5 cmH20] 5 cmH20 Plateau Pressure:  [17 cmH20] 17 cmH20   Intake/Output Summary (Last 24 hours) at 11/30/2019 0858 Last data filed at 11/30/2019 0700 Gross per 24 hour  Intake 1757.02 ml  Output 1759 ml  Net -1.98 ml   Filed Weights   11/25/19 0427 11/28/19 0500 11/29/19 0500  Weight: 77.8 kg 79.8 kg 79.6 kg    Examination:  General - asleep, intubated elderly woman, resting in bed. Wakes to touch and loud voice Eyes - pupils reactive, 61mm bilaterally ENT - ETT in place, protuberant tongue Cardiac - regular rate/rhythm, no murmur, rubs, gallops Chest - wheezing noted bilaterally, normal respiratory effort Abdomen - soft, non tender, + bowel sounds Extremities - 1+ pedal edema Skin - no rashes Neuro - awakens and opens eyes, not following commands due to falling back asleep, moves extremities spontaneously, good tone  Resolved Hospital Problem list     Assessment & Plan:   Acute hypoxic respiratory failure with compromised airway in setting of SAH. Some wheezing noted on exam today. CXR showing mild atelectasis but no prominent infiltrates. - PS wean - Barriers to extubation include mental status, upper airway anatomy and deconditioning; might need trach to assist with vent weaning - Plan for tracheostomy @11am  - goal SpO2 > 92% - Will obtain echo  Acute left-sided SAH with hydrocephalus, likely ruptured left MCA  aneurysm. Hx of seizures. Known prior left MCA aneurysm. Left cerebral hemisphere SAH with right to left midline shift. Right frontal ventriculostomy at 10 cmH20 placed by NSG 6/7 for obtundation and new hydrocephalus. S/p aneurysm clipping 6/7. - Nimotop - Keppra 500mg  BID - TCDs - Continue EVD  Fever. Currently afebrile with Tmax 99.7. Likely was central. Developed mild  leukocytosis to 10.9 on 6/16, but normalized to 10 last night. - Tylenol as needeed - cooling blanket as needed  Anemia Hb decreased to 6.8 last night. Pt received 1u PRBC.  - F/u Hb and Hct - Daily CBC  CKD 2. Non gap metabolic acidosis. - f/u BMET - Continue plasmalyte  Hypernatremia Na down to 150 today (from 153 on 6/14). - 200cc q8 free water   - Follow BMP  Hyperkalemia Of 6.2 on 6/16, down to 5.7 this AM. Bedside EKG without signs of QRS widening or peaked T wave. Received 2 packets of Lokelma - Lokelma 10 g  - Trend BMET  HTN. - Goal SBP < 160 - Continue norvasc, hydralazine  DM II poorly controlled with hyperglycemia. - SSI - Levemir 8u BID - Tube feed coverage  Best practice:  Diet: Tube feeds Pain/Anxiety/Delirium protocol (if indicated): na VAP protocol (if indicated): na DVT prophylaxis: Add Lovenox when OK with NSG GI prophylaxis: Protonix Glucose control: SSI, Levemir Mobility: Bed rest Code Status: Full code Family Communication: I spoke with pt's daughter, Suzanne Boron. Confirmed no questions about trach this morning and updated on plan/course. Disposition: Neuro ICU   Critical care time:   Acquanetta Sit, MS4

## 2019-11-30 NOTE — Progress Notes (Signed)
°  Echocardiogram 2D Echocardiogram has been performed.  Kostas Marrow G Therma Lasure 11/30/2019, 2:51 PM

## 2019-11-30 NOTE — Progress Notes (Signed)
eLink Physician-Brief Progress Note Patient Name: Rebecca Green DOB: [birth year] MRN: [id number]   Date of Service  11/30/2019  HPI/Events of Note  Hyperkalemia = K+ = 6.2 --> 5.9. EKG on bedside monitor without evidence of widened QRS or peaked T wave.   eICU Interventions  Plan: 1. Lokelma 10 gm per tube now.  2. Repeat CMP at 5 AM.      Intervention Category Major Interventions: Electrolyte abnormality - evaluation and management  Siria Calandro Eugene 11/30/2019, 2:07 AM

## 2019-12-01 ENCOUNTER — Inpatient Hospital Stay (HOSPITAL_COMMUNITY): Payer: Medicare Other

## 2019-12-01 DIAGNOSIS — I6012 Nontraumatic subarachnoid hemorrhage from left middle cerebral artery: Secondary | ICD-10-CM | POA: Diagnosis not present

## 2019-12-01 DIAGNOSIS — I609 Nontraumatic subarachnoid hemorrhage, unspecified: Secondary | ICD-10-CM | POA: Diagnosis not present

## 2019-12-01 LAB — CBC
HCT: 25.1 % — ABNORMAL LOW (ref 36.0–46.0)
Hemoglobin: 7.7 g/dL — ABNORMAL LOW (ref 12.0–15.0)
MCH: 30.1 pg (ref 26.0–34.0)
MCHC: 30.7 g/dL (ref 30.0–36.0)
MCV: 98 fL (ref 80.0–100.0)
Platelets: 480 10*3/uL — ABNORMAL HIGH (ref 150–400)
RBC: 2.56 MIL/uL — ABNORMAL LOW (ref 3.87–5.11)
RDW: 16.6 % — ABNORMAL HIGH (ref 11.5–15.5)
WBC: 13.5 10*3/uL — ABNORMAL HIGH (ref 4.0–10.5)
nRBC: 0 % (ref 0.0–0.2)

## 2019-12-01 LAB — TYPE AND SCREEN
ABO/RH(D): O POS
Antibody Screen: NEGATIVE
Unit division: 0

## 2019-12-01 LAB — COMPREHENSIVE METABOLIC PANEL
ALT: 32 U/L (ref 0–44)
AST: 18 U/L (ref 15–41)
Albumin: 2.3 g/dL — ABNORMAL LOW (ref 3.5–5.0)
Alkaline Phosphatase: 123 U/L (ref 38–126)
Anion gap: 9 (ref 5–15)
BUN: 69 mg/dL — ABNORMAL HIGH (ref 8–23)
CO2: 15 mmol/L — ABNORMAL LOW (ref 22–32)
Calcium: 9.5 mg/dL (ref 8.9–10.3)
Chloride: 126 mmol/L — ABNORMAL HIGH (ref 98–111)
Creatinine, Ser: 1.68 mg/dL — ABNORMAL HIGH (ref 0.44–1.00)
GFR calc Af Amer: 35 mL/min — ABNORMAL LOW (ref >60)
GFR calc non Af Amer: 30 mL/min — ABNORMAL LOW (ref >60)
Glucose, Bld: 172 mg/dL — ABNORMAL HIGH (ref 70–99)
Potassium: 4.7 mmol/L (ref 3.5–5.1)
Sodium: 150 mmol/L — ABNORMAL HIGH (ref 135–145)
Total Bilirubin: 0.4 mg/dL (ref 0.3–1.2)
Total Protein: 6.2 g/dL — ABNORMAL LOW (ref 6.5–8.1)

## 2019-12-01 LAB — GLUCOSE, CAPILLARY
Glucose-Capillary: 104 mg/dL — ABNORMAL HIGH (ref 70–99)
Glucose-Capillary: 104 mg/dL — ABNORMAL HIGH (ref 70–99)
Glucose-Capillary: 119 mg/dL — ABNORMAL HIGH (ref 70–99)
Glucose-Capillary: 141 mg/dL — ABNORMAL HIGH (ref 70–99)
Glucose-Capillary: 93 mg/dL (ref 70–99)
Glucose-Capillary: 93 mg/dL (ref 70–99)

## 2019-12-01 LAB — BPAM RBC
Blood Product Expiration Date: 202107102359
ISSUE DATE / TIME: 202106170130
Unit Type and Rh: 5100

## 2019-12-01 LAB — MAGNESIUM: Magnesium: 2.7 mg/dL — ABNORMAL HIGH (ref 1.7–2.4)

## 2019-12-01 LAB — PHOSPHORUS: Phosphorus: 5.1 mg/dL — ABNORMAL HIGH (ref 2.5–4.6)

## 2019-12-01 MED ORDER — FREE WATER
200.0000 mL | Freq: Four times a day (QID) | Status: DC
  Administered 2019-12-01 – 2019-12-02 (×3): 200 mL

## 2019-12-01 MED ORDER — VANCOMYCIN HCL IN DEXTROSE 1-5 GM/200ML-% IV SOLN
1000.0000 mg | INTRAVENOUS | Status: DC
  Administered 2019-12-02: 1000 mg via INTRAVENOUS
  Filled 2019-12-01 (×2): qty 200

## 2019-12-01 MED ORDER — SODIUM CHLORIDE 0.9 % IV BOLUS
500.0000 mL | Freq: Once | INTRAVENOUS | Status: AC
  Administered 2019-12-01: 500 mL via INTRAVENOUS

## 2019-12-01 MED ORDER — CEFAZOLIN SODIUM-DEXTROSE 2-4 GM/100ML-% IV SOLN
2.0000 g | Freq: Three times a day (TID) | INTRAVENOUS | Status: DC
  Administered 2019-12-01: 2 g via INTRAVENOUS
  Filled 2019-12-01: qty 100

## 2019-12-01 MED ORDER — VANCOMYCIN HCL 1500 MG/300ML IV SOLN
1500.0000 mg | Freq: Once | INTRAVENOUS | Status: AC
  Administered 2019-12-01: 1500 mg via INTRAVENOUS
  Filled 2019-12-01: qty 300

## 2019-12-01 NOTE — Progress Notes (Signed)
Patient ID: Rebecca Green, female   DOB: [birth year], 69 y.o.   MRN: [id number] BP (!) 141/68   Pulse 96   Temp 99.7 F (37.6 C) (Axillary)   Resp (!) 22   Ht 5\' 6"  (1.676 m)   Wt 79.6 kg   SpO2 100%   BMI 28.32 kg/m  Eyes open to voice, moving extremities Not consistently following commands perrl Ventricular catheter draining well

## 2019-12-01 NOTE — Progress Notes (Signed)
PT Cancellation Note  Patient Details Name: Rebecca Green MRN: [id number] DOB: 10-23-1950   Cancelled Treatment:    Reason Eval/Treat Not Completed: Active bedrest order. Pt's only activity order at this time is for bedrest since June 6th. PT attempts to reach out to neurosurgery team for updates on activity orders, awaiting response.   Zenaida Niece 12/01/2019, 4:26 PM

## 2019-12-01 NOTE — Progress Notes (Signed)
eLink Physician-Brief Progress Note Patient Name: Rebecca Green DOB: 12-Jun-1951 MRN: [id number]   Date of Service  12/01/2019  HPI/Events of Note  Hypotension  eICU Interventions  Normal saline 500 ml iv fluid bolus x 1        [name] U [name] 12/01/2019, 5:49 AM

## 2019-12-01 NOTE — Progress Notes (Signed)
Transcranial Doppler  Date POD PCO2 HCT BP  MCA ACA PCA OPHT SIPH VERT Basilar  6/8 JP     Right  Left   51  *   -44  *   23  *   23  25   38  43   *  *   *      6/9 GC     Right  Left   41  *   *  *   26  *   17  17   34  51   *  *   *      6/11 GC     Right  Left   *  68   *  -20   *  36   20  29   32  36   -38  -45   -44      6/14 MR      Right  Left   77  47   -38  *   21  55   *  20   68  48   *  *   *       6/16 JP     Right  Left   90  95   -76  -53   36  23   13  24    88  97   -63  -42   -53      6/18 MR     Right  Left   55  43   *  *   31  30   26   36   69  41   *  *   *  *         Right  Left                                        MCA = Middle Cerebral Artery      OPHT = Opthalmic Artery     BASILAR = Basilar Artery   ACA = Anterior Cerebral Artery     SIPH = Carotid Siphon PCA = Posterior Cerebral Artery   VERT = Verterbral Artery                   Normal MCA = 62+\-12 ACA = 50+\-12 PCA = 42+\-23   * = not insonated.   Abram Sander 12/01/2019 11:50 AM

## 2019-12-01 NOTE — Progress Notes (Addendum)
Pharmacy Antibiotic Note  Rebecca Green is a 69 y.o. female admitted on 11/19/2019 with SAH. S/p trach on 6/17. Trach aspirate cultures are now growing staph epi. She did receive ceftriaxone for possible aspiration PNA but has been off antibiotics since 6/10. Pharmacy has been consulted for cefazolin dosing. Given high potential for resistance to cefazolin, it is recommended to transition to vancomycin until sensitivities result. CCM agrees.   Plan: Stop cefazolin Vancomycin 1500 mg IV x 1, followed by vancomycin 1000 mg IV q24h Monitor renal function, cultures, vancomycin levels, and de-escalation/LOT   Height: 5\' 6"  (167.6 cm) Weight: 79.6 kg (175 lb 7.8 oz) IBW/kg (Calculated) : 59.3  Temp (24hrs), Avg:99.4 F (37.4 C), Min:98.7 F (37.1 C), Max:100.1 F (37.8 C)  Recent Labs  Lab 11/26/19 0908 11/26/19 0908 11/27/19 0845 11/27/19 0845 11/29/19 1227 11/29/19 2136 11/29/19 2357 11/30/19 0656 11/30/19 1455 12/01/19 0607  WBC 7.8  --  7.9  --  10.9* 10.0  --   --   --  13.5*  CREATININE 1.44*   < > 1.38*   < > 1.56*  --  1.47* 1.42* 1.68* 1.68*   < > = values in this interval not displayed.    Estimated Creatinine Clearance: 33.2 mL/min (A) (by C-G formula based on SCr of 1.68 mg/dL (H)).    Allergies  Allergen Reactions  . Penicillins Rash    Antimicrobials this admission: Ceftriaxone 6/7 >> 6/10 Cefazolin 6/18 x 1 Vancomycin 6/18 >>  Dose adjustments this admission: None  Microbiology results: 6/17 TA: few staph epi 6/8 TA: normal flora  6/6 MRSA PCR: neg  Thank you for allowing pharmacy to be a part of this patient's care.  Vertis Kelch, PharmD, [facility] PGY2 Cardiology Pharmacy Resident Phone [phone] 12/01/2019       12:37 PM  Please check [email or web address] for unit-specific pharmacist phone numbers

## 2019-12-01 NOTE — Progress Notes (Signed)
SLP Cancellation Note  Patient Details Name: Rebecca Green MRN: [id number] DOB: Nov 25, 1950   Cancelled treatment:       Reason Eval/Treat Not Completed: Other (comment) Patient with new tracheostomy on previous date. Orders for SLP eval and treat for PMSV and swallowing received. Will follow pt closely for readiness for SLP interventions as appropriate.     Osie Bond., M.A. [facility] Pager [phone] Office [phone]  12/01/2019, 8:00 AM

## 2019-12-01 NOTE — Progress Notes (Addendum)
NAME:  Gregory, MRN:  [id number], DOB:  09-Nov-1950, LOS: 12 ADMISSION DATE:  11/19/2019, CONSULTATION DATE:  11/20/19 REFERRING MD:  Zada Finders, CHIEF COMPLAINT:  SAH    Brief History   69 year old female with PMH significant for seizures, previous CVA's, HTN, and known left MCA aneurysm who reports for sudden unresponsiveness, left-sided facial droop, and right upper extremity weakness. CT Head revealed large volume left sided SAH with 61mm midline shift. Intubated and transferred to CCM. Trach placed 6/17.  Past Medical History  CVA (No residual deficits) HTN Seizures DM II   Significant Hospital Events   6/06 Admitted to [facility] NeuroICU 6/07 Aneurysm clipped. Acute decompensation - right frontal EVD placed. Intubated for airway protection 6/11 fever 101.3 6/12 change to pressure control 6/14 on PS wean 6/17 trach placed  Consults:  Neurology Neurosurgery  Procedures:  6/7 EVD >> 6/7 ETT >>  6/7 Left pterional craniotomy for clipping of MCA aneurysm 6/17 trach placement  Significant Diagnostic Tests:   6/7 CT Head >> Large volume SAH overlying left cerebral hemisphere with  left to right midline shift. New hydrocephalus of lateral and third vents. SAH reflux into occipital horns of lateral vents.   6/6 CTA Neck >> Patent common and internal carotids without significant stenosis. Mixed plaque within proximal left ICA. Patent vertebrals.   6/6 CTA Head >> 3x2 mm left MCA bifurcation aneurysm (increased in size from April 2020). No intracranial LVO. High grade focal stenosis within inferior division mid M2 left MCA  6/9 renal u/s >> Right renal cysts without complicating factors.  No hydronephrosis.  6/17 post-trach CXR >> L base atelectasis with small left pleural effusion. Lungs otherwise clear.  Micro Data:  6/6 SARS CoV2 >> Negative 6/8 resp >>  Normal oral flora  Antimicrobials:  6/7 Rocephin >> 6/10  Interim history/subjective:  No acute events overnight.  Tracheostomy in place. Patient is awake and moving extremities spontaneously. Not following commands.  10:39 AM: On repeat exam, BP noted to be 54/29. BP rechecked and up to 130s.   Objective   Blood pressure (!) 99/49, pulse 96, temperature 100.1 F (37.8 C), temperature source Oral, resp. rate (!) 30, height 5\' 6"  (1.676 m), weight 79.6 kg, SpO2 100 %.    Vent Mode: PCV FiO2 (%):  [40 %-100 %] 40 % Set Rate:  [20 bmp] 20 bmp PEEP:  [5 cmH20] 5 cmH20 Plateau Pressure:  [17 cmH20-22 cmH20] 21 cmH20   Intake/Output Summary (Last 24 hours) at 12/01/2019 0747 Last data filed at 12/01/2019 0600 Gross per 24 hour  Intake 835.93 ml  Output 2181 ml  Net -1345.07 ml   Filed Weights   11/25/19 0427 11/28/19 0500 11/29/19 0500  Weight: 77.8 kg 79.8 kg 79.6 kg    Examination:  General - asleep, intubated elderly woman, resting in bed. Wakes to touch and loud voice Eyes - pupils reactive, 54mm bilaterally ENT - ETT in place, protuberant tongue Cardiac - regular rate/rhythm, no murmur, rubs, gallops Chest - wheezing noted bilaterally, normal respiratory effort Abdomen - soft, non tender, + bowel sounds Extremities - 1+ pedal edema Skin - no rashes Neuro - awakens and opens eyes, not following commands due to falling back asleep, moves extremities spontaneously, good tone  Resolved Hospital Problem list     Assessment & Plan:   Acute hypoxic respiratory failure with compromised airway in setting of SAH. Grade I diastolic CHF Some wheezing noted on exam yesterday, though improved today. Echo  obtained and showing LVEF 70-75% with grade I diastolic dysfunction and no valvular disease. CXR following trach showed L pleural effusion that is improved on CXR this AM. She now has a trach, placed 6/17. - goal SpO2 > 92%. 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 Hx of seizures. Known prior left MCA  aneurysm. Left cerebral hemisphere SAH with right to left midline shift. Right frontal ventriculostomy at 10 cmH20 placed by NSG 6/7 for obtundation and new hydrocephalus. S/p aneurysm clipping 6/7. - Nimotop - Keppra 500mg  BID - TCDs - Continue EVD  Hypotension, Fever, Leukocytosis.  TMax 100.1 today with temp ranging down to 98.7. Likely central. She also developed leukocytosis to 13.5 on 6/18 from 10. No obvious nidus of infection seen on CXR. Pt has had soft Bps in the 80s/90s, with one measured BP of 54/29 today. BP was higher on recheck. - Fluid bolus - F/u UA - Tylenol as needed - cooling blanket as needed  Anemia Hb 7.7 this morning. Pt received 1u PRBC on 6/17 for Hb at 6.8. - Daily CBC - Transfuse at Hb <7  CKD 2. Non gap metabolic acidosis. Cr 1.68 and stable from prior.  - f/u BMET  Hypernatremia Remains hypernatremic with Na of 150 (down from 153 on 6/14). - 200cc q8 free water   - Follow BMP  Hyperkalemia Pt had K of 6.2 on 6/16. Bedside EKG without signs of QRS widening or peaked T wave. Received 3 packets of Lokelma. Currently K normalized to 4.7. - Trend BMET  HTN. Pt has had soft Bps in the 80-90s. - Goal SBP < 160 - Hold norvasc, hydralazine  DM II poorly controlled with hyperglycemia. - SSI - Levemir 8u BID - Tube feed coverage  Best practice:  Diet: Tube feeds Pain/Anxiety/Delirium protocol (if indicated): na VAP protocol (if indicated): na DVT prophylaxis: Add Lovenox when OK with NSG GI prophylaxis: Protonix Glucose control: SSI, Levemir Mobility: Bed rest Code Status: Full code Family Communication: Updated patient's daughter, Suzanne Boron, on 6/17. Disposition: Neuro ICU   Critical care time:   Acquanetta Sit, MS4

## 2019-12-02 ENCOUNTER — Inpatient Hospital Stay (HOSPITAL_COMMUNITY): Payer: Medicare Other

## 2019-12-02 DIAGNOSIS — I609 Nontraumatic subarachnoid hemorrhage, unspecified: Secondary | ICD-10-CM

## 2019-12-02 DIAGNOSIS — J9600 Acute respiratory failure, unspecified whether with hypoxia or hypercapnia: Secondary | ICD-10-CM

## 2019-12-02 DIAGNOSIS — N179 Acute kidney failure, unspecified: Secondary | ICD-10-CM

## 2019-12-02 DIAGNOSIS — Z930 Tracheostomy status: Secondary | ICD-10-CM

## 2019-12-02 DIAGNOSIS — I6012 Nontraumatic subarachnoid hemorrhage from left middle cerebral artery: Secondary | ICD-10-CM | POA: Diagnosis not present

## 2019-12-02 LAB — POCT I-STAT 7, (LYTES, BLD GAS, ICA,H+H)
Acid-base deficit: 12 mmol/L — ABNORMAL HIGH (ref 0.0–2.0)
Bicarbonate: 13.7 mmol/L — ABNORMAL LOW (ref 20.0–28.0)
Calcium, Ion: 1.46 mmol/L — ABNORMAL HIGH (ref 1.15–1.40)
HCT: 21 % — ABNORMAL LOW (ref 36.0–46.0)
Hemoglobin: 7.1 g/dL — ABNORMAL LOW (ref 12.0–15.0)
O2 Saturation: 98 %
Patient temperature: 100.5
Potassium: 4.7 mmol/L (ref 3.5–5.1)
Sodium: 153 mmol/L — ABNORMAL HIGH (ref 135–145)
TCO2: 15 mmol/L — ABNORMAL LOW (ref 22–32)
pCO2 arterial: 31.9 mmHg — ABNORMAL LOW (ref 32.0–48.0)
pH, Arterial: 7.247 — ABNORMAL LOW (ref 7.350–7.450)
pO2, Arterial: 126 mmHg — ABNORMAL HIGH (ref 83.0–108.0)

## 2019-12-02 LAB — COMPREHENSIVE METABOLIC PANEL
ALT: 30 U/L (ref 0–44)
AST: 21 U/L (ref 15–41)
Albumin: 2.4 g/dL — ABNORMAL LOW (ref 3.5–5.0)
Alkaline Phosphatase: 123 U/L (ref 38–126)
Anion gap: 11 (ref 5–15)
BUN: 69 mg/dL — ABNORMAL HIGH (ref 8–23)
CO2: 13 mmol/L — ABNORMAL LOW (ref 22–32)
Calcium: 9.6 mg/dL (ref 8.9–10.3)
Chloride: 128 mmol/L — ABNORMAL HIGH (ref 98–111)
Creatinine, Ser: 1.71 mg/dL — ABNORMAL HIGH (ref 0.44–1.00)
GFR calc Af Amer: 35 mL/min — ABNORMAL LOW (ref >60)
GFR calc non Af Amer: 30 mL/min — ABNORMAL LOW (ref >60)
Glucose, Bld: 130 mg/dL — ABNORMAL HIGH (ref 70–99)
Potassium: 5 mmol/L (ref 3.5–5.1)
Sodium: 152 mmol/L — ABNORMAL HIGH (ref 135–145)
Total Bilirubin: 0.5 mg/dL (ref 0.3–1.2)
Total Protein: 6.3 g/dL — ABNORMAL LOW (ref 6.5–8.1)

## 2019-12-02 LAB — URINALYSIS, ROUTINE W REFLEX MICROSCOPIC
Bilirubin Urine: NEGATIVE
Glucose, UA: NEGATIVE mg/dL
Hgb urine dipstick: NEGATIVE
Ketones, ur: NEGATIVE mg/dL
Leukocytes,Ua: NEGATIVE
Nitrite: NEGATIVE
Protein, ur: 30 mg/dL — AB
Specific Gravity, Urine: 1.019 (ref 1.005–1.030)
pH: 5 (ref 5.0–8.0)

## 2019-12-02 LAB — CBC
HCT: 26.9 % — ABNORMAL LOW (ref 36.0–46.0)
Hemoglobin: 8.1 g/dL — ABNORMAL LOW (ref 12.0–15.0)
MCH: 30.2 pg (ref 26.0–34.0)
MCHC: 30.1 g/dL (ref 30.0–36.0)
MCV: 100.4 fL — ABNORMAL HIGH (ref 80.0–100.0)
Platelets: 491 10*3/uL — ABNORMAL HIGH (ref 150–400)
RBC: 2.68 MIL/uL — ABNORMAL LOW (ref 3.87–5.11)
RDW: 16.6 % — ABNORMAL HIGH (ref 11.5–15.5)
WBC: 15.7 10*3/uL — ABNORMAL HIGH (ref 4.0–10.5)
nRBC: 0 % (ref 0.0–0.2)

## 2019-12-02 LAB — CULTURE, RESPIRATORY W GRAM STAIN

## 2019-12-02 LAB — PHOSPHORUS: Phosphorus: 4.5 mg/dL (ref 2.5–4.6)

## 2019-12-02 LAB — GLUCOSE, CAPILLARY
Glucose-Capillary: 186 mg/dL — ABNORMAL HIGH (ref 70–99)
Glucose-Capillary: 67 mg/dL — ABNORMAL LOW (ref 70–99)
Glucose-Capillary: 149 mg/dL — ABNORMAL HIGH (ref 70–99)
Glucose-Capillary: 126 mg/dL — ABNORMAL HIGH (ref 70–99)
Glucose-Capillary: 153 mg/dL — ABNORMAL HIGH (ref 70–99)
Glucose-Capillary: 90 mg/dL (ref 70–99)

## 2019-12-02 LAB — MAGNESIUM: Magnesium: 2.6 mg/dL — ABNORMAL HIGH (ref 1.7–2.4)

## 2019-12-02 LAB — BASIC METABOLIC PANEL
Anion gap: 12 (ref 5–15)
BUN: 72 mg/dL — ABNORMAL HIGH (ref 8–23)
CO2: 15 mmol/L — ABNORMAL LOW (ref 22–32)
Calcium: 9.5 mg/dL (ref 8.9–10.3)
Chloride: 123 mmol/L — ABNORMAL HIGH (ref 98–111)
Creatinine, Ser: 1.78 mg/dL — ABNORMAL HIGH (ref 0.44–1.00)
GFR calc Af Amer: 33 mL/min — ABNORMAL LOW (ref >60)
GFR calc non Af Amer: 28 mL/min — ABNORMAL LOW (ref >60)
Glucose, Bld: 90 mg/dL (ref 70–99)
Potassium: 4.9 mmol/L (ref 3.5–5.1)
Sodium: 150 mmol/L — ABNORMAL HIGH (ref 135–145)

## 2019-12-02 LAB — PROCALCITONIN: Procalcitonin: 1.04 ng/mL

## 2019-12-02 MED ORDER — SODIUM BICARBONATE 8.4 % IV SOLN
INTRAVENOUS | Status: DC
  Filled 2019-12-02: qty 150

## 2019-12-02 MED ORDER — ALBUTEROL SULFATE (2.5 MG/3ML) 0.083% IN NEBU: 2.5000 mg | INHALATION_SOLUTION | RESPIRATORY_TRACT | Status: DC | PRN

## 2019-12-02 MED ORDER — DEXTROSE 50 % IV SOLN: 50.0000 mL | Freq: Once | INTRAVENOUS | Status: AC

## 2019-12-02 MED ORDER — FREE WATER
200.0000 mL | Status: DC
  Administered 2019-12-02 – 2019-12-03 (×5): 200 mL

## 2019-12-02 MED ORDER — SODIUM BICARBONATE-DEXTROSE 150-5 MEQ/L-% IV SOLN
150.0000 meq | INTRAVENOUS | Status: DC
  Administered 2019-12-02: 150 meq via INTRAVENOUS
  Filled 2019-12-02: qty 1000

## 2019-12-02 MED ORDER — SODIUM BICARBONATE 8.4 % IV SOLN
100.0000 meq | Freq: Once | INTRAVENOUS | Status: AC
  Administered 2019-12-02: 100 meq via INTRAVENOUS

## 2019-12-02 MED ORDER — SODIUM BICARBONATE 8.4 % IV SOLN
INTRAVENOUS | Status: DC
  Filled 2019-12-02 (×2): qty 100

## 2019-12-02 MED ORDER — DEXTROSE 50 % IV SOLN
INTRAVENOUS | Status: AC
  Administered 2019-12-02: 50 mL
  Filled 2019-12-02: qty 50

## 2019-12-02 MED ORDER — SODIUM BICARBONATE 8.4 % IV SOLN
INTRAVENOUS | Status: AC
  Filled 2019-12-02: qty 100

## 2019-12-02 NOTE — Progress Notes (Addendum)
NAME:  Rebecca Green, MRN:  [id number], DOB:  August 23, 1950, LOS: 30 ADMISSION DATE:  11/19/2019, CONSULTATION DATE:  11/20/19 REFERRING MD:  Zada Finders, CHIEF COMPLAINT:  SAH    Brief History   70 year old female with PMH significant for seizures, previous CVA's, HTN, and known left MCA aneurysm who reports for sudden unresponsiveness, left-sided facial droop, and right upper extremity weakness. CT Head revealed large volume left sided SAH with 85mm midline shift.   Past Medical History  CVA (No residual deficits) HTN Seizures DM II   Significant Hospital Events   6/06 Admitted to [facility]ICU 6/07 Aneurysm clipped. Acute decompensation - right frontal EVD placed. Intubated for airway protection 6/11 fever 101.3 6/12 change to pressure control 6/14 on PS wean 6/17 Trach Placed   Consults:  Neurology Neurosurgery  Procedures:  6/7 EVD >> 6/7 ETT > 6/17 6/7 Left pterional craniotomy for clipping of MCA aneurysm 6/17 Trach >>   Significant Diagnostic Tests:  6/7 CT Head > Large volume SAH overlying left cerebral hemisphere with  left to right midline shift. New hydrocephalus of lateral and third vents. SAH reflux into occipital horns of lateral vents.  6/6 CTA Neck > Patent common and internal carotids without significant stenosis. Mixed plaque within proximal left ICA. Patent vertebrals.  6/6 CTA Head > 3x2 mm left MCA bifurcation aneurysm (increased in size from April 2020). No intracranial LVO. High grade focal stenosis within inferior division mid M2 left MCA 6/9 renal u/s > Right renal cysts without complicating factors.  No hydronephrosis.  Micro Data:  6/6 SARS CoV2 > Negative 6/8 resp >  Normal oral flora 6/17 resp > Few Staph Epidermidis   Antimicrobials:  6/7 Rocephin > 6/10  Interim history/subjective:  Low grade fever throughout the night. No other events.   Objective   Blood pressure (!) 147/61, pulse (!) 113, temperature (!) 100.5 F (38.1 C), temperature  source Axillary, resp. rate (!) 30, height 5\' 6"  (1.676 m), weight 78 kg, SpO2 100 %.    Vent Mode: PCV FiO2 (%):  [40 %] 40 % Set Rate:  [20 bmp] 20 bmp PEEP:  [5 cmH20] 5 cmH20 Plateau Pressure:  [16 cmH20-18 cmH20] 18 cmH20   Intake/Output Summary (Last 24 hours) at 12/02/2019 1049 Last data filed at 12/02/2019 1000 Gross per 24 hour  Intake 1655.11 ml  Output 2032 ml  Net -376.89 ml   Filed Weights   11/28/19 0500 11/29/19 0500 12/02/19 0436  Weight: 79.8 kg 79.6 kg 78 kg    Examination: General - elderly female, lying in bed  HEENT: Trach in place >> clean, dry, intact. EVD in place  Cardiac - RRR, no MRG  Chest - Faint Exp wheeze, coarse breath sounds  Abdomen - soft, non tender, + bowel sounds Extremities - 1+ pedal edema Skin - no rashes Neuro - alert, does not follow commands, pupils intact   Resolved Hospital Problem list     Assessment & Plan:   Acute hypoxic respiratory failure with compromised airway in setting of SAH. S/P Trach 6/17  Plan - Wean as tolerated. Trach Collar when able. Will obtain ABG to evaluate pH. Patient with tachypnea. No secretions. CXR clear.  - goal SpO2 > 92% - VAP Bundle  - Trend CXR   Acute left-sided SAH with hydrocephalus, likely ruptured left MCA aneurysm. -EVD placed 6/7 -Clipping 6/7 Hx of seizures. Plan  - Per Neurosurgery  - Nimotop - Keppra 500mg  BID - TCDs - Continue  EVD  Febrile. Leucocytosis. Sputum 6/17 with Staph Epidermidis > Concern for Asp PNA    -PCT 1.04  -CXR with no acute findings  Plan  - Trend WBC and Fever Curve - PAN Culture  - Continue Vancomycin (Started 6/18). Follow Culture Data   Anemia Plan -Trend CBC -Transfuse for hemoglobin <7   Hypernatremia  Acute on CKD stage 2. Non gap metabolic acidosis. Hyperkalemia >> Improving s/p Lokelma  Plan - Trend BMP - Increase free water to 200 q4h >> Repeat BMP 1600 - Start low dose Bicarb gtt >> may need nephrology consult. However currently  with adequate urinary output  HTN. - Goal SBP < 160 - Continue norvasc, hydralazine  DM II poorly controlled with hyperglycemia. - SSI - Levemir 8u BID - Tube feed coverage  Best practice:  Diet: Tube feeds Pain/Anxiety/Delirium protocol (if indicated): na VAP protocol (if indicated): na DVT prophylaxis: Add Lovenox when OK with NSG GI prophylaxis: Protonix Glucose control: SSI, Levemir Mobility: Bed rest Code Status: Full code Family Communication: Will updated on plan of care.  Disposition: Continue ICU Care.    Critical care time: [place], AGACNP-BC [place] Pgr: [phone]

## 2019-12-02 NOTE — Progress Notes (Signed)
PT Cancellation Note  Patient Details Name: Macenzie Burford Malkin MRN: [id number] DOB: 1951-01-17   Cancelled Treatment:    Reason Eval/Treat Not Completed: Fatigue/lethargy limiting ability to participate. PT attempts to arouse pt however is unable to with verbal commands, PROM, painful stimulation or sternal rub. RN made aware. PT will hold until pt is more alert and better able to participate in PT evaluation.   Zenaida Niece 12/02/2019, 3:01 PM

## 2019-12-02 NOTE — Progress Notes (Signed)
Patient ID: [place], female   DOB: Dec 17, 1950, 69 y.o.   MRN: [id number] Subjective: Patient remains intubated via trach, working to breathe, FC intermittently, MAE, EVD patent with ICP around 12  Objective: Vital signs in last 24 hours: Temp:  [99.4 F (37.4 C)-100.6 F (38.1 C)] 100.5 F (38.1 C) (06/19 0800) Pulse Rate:  [81-104] 81 (06/19 0800) Resp:  [7-52] 24 (06/19 0800) BP: (54-163)/(29-86) 141/62 (06/19 0800) SpO2:  [100 %] 100 % (06/19 0808) FiO2 (%):  [40 %] 40 % (06/19 0808) Weight:  [78 kg] 78 kg (06/19 0436)  Intake/Output from previous day: 06/18 0701 - 06/19 0700 In: 1520.1 [NG/GT:1120; IV Piggyback:400.1] Out: 1772 [Urine:1700; Drains:72] Intake/Output this shift: Total I/O In: 45 [NG/GT:45] Out: 5 [Drains:5]  as above  Lab Results: Lab Results  Component Value Date   WBC 15.7 (H) 12/02/2019   HGB 8.1 (L) 12/02/2019   HCT 26.9 (L) 12/02/2019   MCV 100.4 (H) 12/02/2019   PLT 491 (H) 12/02/2019   Lab Results  Component Value Date   INR 1.1 11/30/2019   BMET Lab Results  Component Value Date   NA 152 (H) 12/02/2019   K 5.0 12/02/2019   CL 128 (H) 12/02/2019   CO2 13 (L) 12/02/2019   GLUCOSE 130 (H) 12/02/2019   BUN 69 (H) 12/02/2019   CREATININE 1.71 (H) 12/02/2019   CALCIUM 9.6 12/02/2019    Studies/Results: DG CHEST PORT 1 VIEW  Result Date: 12/01/2019 CLINICAL DATA:  Tracheostomy. EXAM: PORTABLE CHEST 1 VIEW COMPARISON:  11/30/2019. FINDINGS: Tracheostomy tube and feeding tube in stable position. Heart size stable. Low lung volumes. Persistent but improved bibasilar atelectasis. No pleural effusion or pneumothorax. No acute bony abnormality. IMPRESSION: 1.  Tracheostomy and feeding tube in stable position. 2. Low lung volumes. Persistent but improved bibasilar atelectasis. Electronically Signed   By: Marcello Moores  Register   On: 12/01/2019 08:22   DG Chest Port 1 View  Result Date: 11/30/2019 CLINICAL DATA:  Tracheostomy placement EXAM:  PORTABLE CHEST 1 VIEW COMPARISON:  November 30, 2019 FINDINGS: Tracheostomy now present with tracheostomy catheter tip 3.9 cm above the carina. Feeding tube tip is below the diaphragm. No pneumothorax. There is a small left pleural effusion with left base atelectasis. Lungs elsewhere are clear. Heart size is upper normal with pulmonary vascularity normal. No adenopathy. No bone lesions. IMPRESSION: Tube positions as described without pneumothorax. Left base atelectasis with small left pleural effusion. Lungs elsewhere clear. Stable cardiac silhouette. Aortic Atherosclerosis (ICD10-I70.0). Electronically Signed   By: Lowella Grip III M.D.   On: 11/30/2019 12:57   ECHOCARDIOGRAM COMPLETE  Result Date: 11/30/2019    ECHOCARDIOGRAM REPORT   Patient Name:   Rebecca Green Date of Exam: 11/30/2019 Medical Rec #:  [id number]      Height:       66.0 in Accession #:    [id number]     Weight:       175.5 lb Date of Birth:  [birth year]       BSA:          1.892 m Patient Age:    17 years       BP:           104/48 mmHg Patient Gender: F              HR:           100 bpm. Exam Location:  Inpatient Procedure: 2D Echo, Cardiac Doppler and Color Doppler Indications:  Acute Respiratory Insufficiency 518.82 / R06.89  History:        Patient has no prior history of Echocardiogram examinations.                 Stroke; Risk Factors:Hypertension. Seizures.  Sonographer:    Jonelle Sidle Dance Referring Phys: 21 MURALI RAMASWAMY  Sonographer Comments: Echo performed with patient supine and on artificial respirator. IMPRESSIONS  1. Small hyperdynamic LV with likely SAM and LVOT gradient not well characterized on doppler suggest beta blocker and volume if clinically appropriate . Left ventricular ejection fraction, by estimation, is 70 to 75%. The left ventricle has hyperdynamic  function. The left ventricle has no regional wall motion abnormalities. Left ventricular diastolic parameters are consistent with Grade I diastolic dysfunction  (impaired relaxation). Elevated left ventricular end-diastolic pressure.  2. Right ventricular systolic function is normal. The right ventricular size is normal.  3. The mitral valve is normal in structure. Mild mitral valve regurgitation. No evidence of mitral stenosis.  4. The aortic valve is normal in structure. Aortic valve regurgitation is not visualized. No aortic stenosis is present.  5. The inferior vena cava is normal in size with greater than 50% respiratory variability, suggesting right atrial pressure of 3 mmHg. FINDINGS  Left Ventricle: Small hyperdynamic LV with likely SAM and LVOT gradient not well characterized on doppler suggest beta blocker and volume if clinically appropriate. Left ventricular ejection fraction, by estimation, is 70 to 75%. The left ventricle has hyperdynamic function. The left ventricle has no regional wall motion abnormalities. The left ventricular internal cavity size was small. There is no left ventricular hypertrophy. Left ventricular diastolic parameters are consistent with Grade I diastolic dysfunction (impaired relaxation). Elevated left ventricular end-diastolic pressure. Right Ventricle: The right ventricular size is normal. No increase in right ventricular wall thickness. Right ventricular systolic function is normal. Left Atrium: Left atrial size was normal in size. Right Atrium: Right atrial size was normal in size. Pericardium: There is no evidence of pericardial effusion. Mitral Valve: The mitral valve is normal in structure. Normal mobility of the mitral valve leaflets. Mild mitral valve regurgitation. No evidence of mitral valve stenosis. Tricuspid Valve: The tricuspid valve is normal in structure. Tricuspid valve regurgitation is not demonstrated. No evidence of tricuspid stenosis. Aortic Valve: The aortic valve is normal in structure. Aortic valve regurgitation is not visualized. No aortic stenosis is present. Pulmonic Valve: The pulmonic valve was normal in  structure. Pulmonic valve regurgitation is not visualized. No evidence of pulmonic stenosis. Aorta: The aortic root is normal in size and structure. Venous: The inferior vena cava is normal in size with greater than 50% respiratory variability, suggesting right atrial pressure of 3 mmHg. IAS/Shunts: No atrial level shunt detected by color flow Doppler.  LEFT VENTRICLE PLAX 2D LVIDd:         3.80 cm  Diastology LVIDs:         3.40 cm  LV e' lateral:   6.42 cm/s LV PW:         1.00 cm  LV E/e' lateral: 16.2 LV IVS:        0.70 cm  LV e' medial:    6.53 cm/s LVOT diam:     1.90 cm  LV E/e' medial:  15.9 LV SV:         135 LV SV Index:   71 LVOT Area:     2.84 cm  RIGHT VENTRICLE  IVC RV Basal diam:  2.70 cm     IVC diam: 1.90 cm RV S prime:     19.60 cm/s TAPSE (M-mode): 1.8 cm LEFT ATRIUM             Index       RIGHT ATRIUM           Index LA diam:        3.70 cm 1.96 cm/m  RA Area:     11.80 cm LA Vol (A2C):   72.6 ml 38.38 ml/m RA Volume:   27.00 ml  14.27 ml/m LA Vol (A4C):   73.5 ml 38.85 ml/m LA Biplane Vol: 74.4 ml 39.33 ml/m  AORTIC VALVE LVOT Vmax:   193.00 cm/s LVOT Vmean:  127.000 cm/s LVOT VTI:    0.477 m  AORTA Ao Root diam: 3.30 cm Ao Asc diam:  3.10 cm MITRAL VALVE MV Area (PHT): 2.56 cm     SHUNTS MV Decel Time: 296 msec     Systemic VTI:  0.48 m MV E velocity: 104.00 cm/s  Systemic Diam: 1.90 cm MV A velocity: 130.00 cm/s MV E/A ratio:  0.80 Jenkins Rouge MD Electronically signed by Jenkins Rouge MD Signature Date/Time: 11/30/2019/5:48:42 PM    Final    VAS [place] TRANSCRANIAL DOPPLER  Result Date: 12/01/2019  Transcranial Doppler Indications: Subarachnoid hemorrhage. Comparison Study: 11/29/19 previous Performing Technologist: Abram Sander RVS  Examination Guidelines: A complete evaluation includes B-mode imaging, spectral Doppler, color Doppler, and power Doppler as needed of all accessible portions of each vessel. Bilateral testing is considered an integral part of a complete  examination. Limited examinations for reoccurring indications may be performed as noted.  +----------+-------------+----------+-----------+-------+ RIGHT TCD Right VM (cm)Depth (cm)PulsatilityComment +----------+-------------+----------+-----------+-------+ MCA           55.00                 1.34            +----------+-------------+----------+-----------+-------+ Term ICA      36.00                 1.06            +----------+-------------+----------+-----------+-------+ PCA           31.00                 1.25            +----------+-------------+----------+-----------+-------+ Opthalmic     26.00                 2.08            +----------+-------------+----------+-----------+-------+ ICA siphon    69.00                 1.79            +----------+-------------+----------+-----------+-------+  +----------+------------+----------+-----------+-------+ LEFT TCD  Left VM (cm)Depth (cm)PulsatilityComment +----------+------------+----------+-----------+-------+ MCA          43.00                 1.35            +----------+------------+----------+-----------+-------+ PCA          30.00                 1.20            +----------+------------+----------+-----------+-------+ Opthalmic    36.00                 1.90            +----------+------------+----------+-----------+-------+  ICA siphon   41.00                 1.84            +----------+------------+----------+-----------+-------+     Preliminary     Assessment/Plan: Continue current mgmt  Estimated body mass index is 27.75 kg/m as calculated from the following:   Height as of this encounter: 5\' 6"  (1.676 m).   Weight as of this encounter: 78 kg.    LOS: 13 days    Eustace Moore 12/02/2019, 9:01 AM

## 2019-12-03 DIAGNOSIS — I609 Nontraumatic subarachnoid hemorrhage, unspecified: Secondary | ICD-10-CM | POA: Diagnosis not present

## 2019-12-03 DIAGNOSIS — I6012 Nontraumatic subarachnoid hemorrhage from left middle cerebral artery: Secondary | ICD-10-CM | POA: Diagnosis not present

## 2019-12-03 DIAGNOSIS — I608 Other nontraumatic subarachnoid hemorrhage: Secondary | ICD-10-CM

## 2019-12-03 LAB — GLUCOSE, CAPILLARY
Glucose-Capillary: 110 mg/dL — ABNORMAL HIGH (ref 70–99)
Glucose-Capillary: 123 mg/dL — ABNORMAL HIGH (ref 70–99)
Glucose-Capillary: 134 mg/dL — ABNORMAL HIGH (ref 70–99)
Glucose-Capillary: 164 mg/dL — ABNORMAL HIGH (ref 70–99)
Glucose-Capillary: 189 mg/dL — ABNORMAL HIGH (ref 70–99)
Glucose-Capillary: 288 mg/dL — ABNORMAL HIGH (ref 70–99)
Glucose-Capillary: 368 mg/dL — ABNORMAL HIGH (ref 70–99)
Glucose-Capillary: 373 mg/dL — ABNORMAL HIGH (ref 70–99)
Glucose-Capillary: 63 mg/dL — ABNORMAL LOW (ref 70–99)

## 2019-12-03 LAB — PHOSPHORUS: Phosphorus: 7.2 mg/dL — ABNORMAL HIGH (ref 2.5–4.6)

## 2019-12-03 LAB — BASIC METABOLIC PANEL
Anion gap: 11 (ref 5–15)
Anion gap: 10 (ref 5–15)
BUN: 76 mg/dL — ABNORMAL HIGH (ref 8–23)
BUN: 85 mg/dL — ABNORMAL HIGH (ref 8–23)
CO2: 18 mmol/L — ABNORMAL LOW (ref 22–32)
CO2: 19 mmol/L — ABNORMAL LOW (ref 22–32)
Calcium: 9.6 mg/dL (ref 8.9–10.3)
Calcium: 9.7 mg/dL (ref 8.9–10.3)
Chloride: 121 mmol/L — ABNORMAL HIGH (ref 98–111)
Chloride: 120 mmol/L — ABNORMAL HIGH (ref 98–111)
Creatinine, Ser: 1.94 mg/dL — ABNORMAL HIGH (ref 0.44–1.00)
Creatinine, Ser: 2.13 mg/dL — ABNORMAL HIGH (ref 0.44–1.00)
GFR calc Af Amer: 30 mL/min — ABNORMAL LOW (ref >60)
GFR calc Af Amer: 26 mL/min — ABNORMAL LOW (ref >60)
GFR calc non Af Amer: 26 mL/min — ABNORMAL LOW (ref >60)
GFR calc non Af Amer: 23 mL/min — ABNORMAL LOW (ref >60)
Glucose, Bld: 74 mg/dL (ref 70–99)
Glucose, Bld: 114 mg/dL — ABNORMAL HIGH (ref 70–99)
Potassium: 5.4 mmol/L — ABNORMAL HIGH (ref 3.5–5.1)
Potassium: 5.1 mmol/L (ref 3.5–5.1)
Sodium: 150 mmol/L — ABNORMAL HIGH (ref 135–145)
Sodium: 149 mmol/L — ABNORMAL HIGH (ref 135–145)

## 2019-12-03 LAB — CREATININE, URINE, RANDOM: Creatinine, Urine: 56.12 mg/dL

## 2019-12-03 LAB — CBC
HCT: 28 % — ABNORMAL LOW (ref 36.0–46.0)
Hemoglobin: 8.3 g/dL — ABNORMAL LOW (ref 12.0–15.0)
MCH: 30.2 pg (ref 26.0–34.0)
MCHC: 29.6 g/dL — ABNORMAL LOW (ref 30.0–36.0)
MCV: 101.8 fL — ABNORMAL HIGH (ref 80.0–100.0)
Platelets: 572 10*3/uL — ABNORMAL HIGH (ref 150–400)
RBC: 2.75 MIL/uL — ABNORMAL LOW (ref 3.87–5.11)
RDW: 16.6 % — ABNORMAL HIGH (ref 11.5–15.5)
WBC: 14.1 10*3/uL — ABNORMAL HIGH (ref 4.0–10.5)
nRBC: 0 % (ref 0.0–0.2)

## 2019-12-03 LAB — MAGNESIUM: Magnesium: 2.7 mg/dL — ABNORMAL HIGH (ref 1.7–2.4)

## 2019-12-03 LAB — PROCALCITONIN: Procalcitonin: 1.44 ng/mL

## 2019-12-03 LAB — SODIUM, URINE, RANDOM: Sodium, Ur: 64 mmol/L

## 2019-12-03 LAB — OSMOLALITY, URINE: Osmolality, Ur: 496 mOsm/kg (ref 300–900)

## 2019-12-03 MED ORDER — SODIUM ZIRCONIUM CYCLOSILICATE 5 G PO PACK
5.0000 g | PACK | Freq: Once | ORAL | Status: AC
  Administered 2019-12-03: 5 g
  Filled 2019-12-03: qty 1

## 2019-12-03 MED ORDER — SODIUM CHLORIDE 0.9% FLUSH
10.0000 mL | INTRAVENOUS | Status: DC | PRN
  Administered 2019-12-20: 10 mL

## 2019-12-03 MED ORDER — CALCIUM ACETATE (PHOS BINDER) 667 MG/5ML PO SOLN
1334.0000 mg | Freq: Three times a day (TID) | ORAL | Status: DC
  Administered 2019-12-03 – 2019-12-04 (×6): 1334 mg
  Filled 2019-12-03 (×7): qty 10

## 2019-12-03 MED ORDER — DEXTROSE 50 % IV SOLN
INTRAVENOUS | Status: AC
  Filled 2019-12-03: qty 50

## 2019-12-03 MED ORDER — VANCOMYCIN HCL 750 MG/150ML IV SOLN
750.0000 mg | INTRAVENOUS | Status: DC
  Filled 2019-12-03: qty 150

## 2019-12-03 MED ORDER — STERILE WATER FOR INJECTION IV SOLN
INTRAVENOUS | Status: DC
  Filled 2019-12-03 (×2): qty 850

## 2019-12-03 MED ORDER — LINEZOLID 600 MG/300ML IV SOLN
600.0000 mg | Freq: Two times a day (BID) | INTRAVENOUS | Status: AC
  Administered 2019-12-03 – 2019-12-05 (×5): 600 mg via INTRAVENOUS
  Filled 2019-12-03 (×5): qty 300

## 2019-12-03 MED ORDER — DEXTROSE 50 % IV SOLN
12.5000 g | INTRAVENOUS | Status: AC
  Administered 2019-12-03: 12.5 g via INTRAVENOUS

## 2019-12-03 MED ORDER — DEXMEDETOMIDINE HCL IN NACL 400 MCG/100ML IV SOLN
0.0000 ug/kg/h | INTRAVENOUS | Status: DC
  Administered 2019-12-03 – 2019-12-04 (×3): 0.4 ug/kg/h via INTRAVENOUS
  Filled 2019-12-03: qty 100

## 2019-12-03 MED ORDER — FREE WATER
200.0000 mL | Status: DC
  Administered 2019-12-03 – 2019-12-05 (×24): 200 mL

## 2019-12-03 MED ORDER — SODIUM CHLORIDE 0.9% FLUSH
10.0000 mL | Freq: Two times a day (BID) | INTRAVENOUS | Status: DC
  Administered 2019-12-03 – 2019-12-06 (×7): 10 mL
  Administered 2019-12-06: 40 mL
  Administered 2019-12-07 – 2019-12-29 (×43): 10 mL

## 2019-12-03 NOTE — Consult Note (Addendum)
Renal Service Consult Note [facility]  Rebecca Green 12/03/2019 Sol Blazing Requesting Physician:  Dr Kathyrn Sheriff, Delane Ginger.   Reason for Consult:  Renal failure HPI: The patient is a 69 y.o. year-old w/ hx of HTN, seizures and CVA presented on 11/19/19 w/ R sided stroke like symptoms/ signs. CT showed SAH large volume mostly on the L side.  Pt underwent EVD and clipping of known L MCA aneurysm.  Intubated on 6/7 and had trach on 6/17.  Pt developed tracheobronchitis w/ cx's + for MRSE and was started on IV vancomycin on 12/01/19. Creat was 1.3- 1.5 since admit but since 6/18 has been rising up to 1.68 > 1.71 > 1.94 yest and 2.13 today.  UOP is stable, K 5- 5.4 which is new. Asked to see for renal failure.    No old creatinine in Epic prior to this admit, no old creat in CE.    Pt sedated on vent w/ trach, unable to give any history.    Creat on admit 11/19/19 was 1.25 . BP's have been high to normal since admit except for <24 hr period on 6/17 w/ BP's in the 80- 035 range systolic.   Wt's are stable since admit at 77- 79 kg since admit. I/O here are 14L in and 12.6 L out.      Pt rec'd IV contrast on 6/6 - 6/7 w/ initial CT scans No acei/ nsaids.  +IV PPI's +Keppra   ROS n/a   Past Medical History  Past Medical History:  Diagnosis Date  . CVA (cerebral vascular accident) ([place])   . HTN (hypertension)   . Seizure [facility])    Past Surgical History  Past Surgical History:  Procedure Laterality Date  . CRANIOTOMY Left 11/20/2019   Procedure: LEFT CRANIOTOMY INTRACRANIAL ANEURYSM CLIPPING;  Surgeon: [name], MD;  Location: [place];  Service: Neurosurgery;  Laterality: Left;  . IR 3D INDEPENDENT WKST  11/20/2019  . IR ANGIO INTRA EXTRACRAN SEL INTERNAL CAROTID BILAT MOD SED  11/20/2019  . IR ANGIO VERTEBRAL SEL VERTEBRAL UNI L MOD SED  11/20/2019  . IR US GUIDE VASC ACCESS RIGHT  11/20/2019  . RADIOLOGY WITH ANESTHESIA N/A 11/20/2019   Procedure: IR WITH ANESTHESIA;  Surgeon:  [name], MD;  Location: [place];  Service: Radiology;  Laterality: N/A;   Family History History reviewed. No pertinent family history. Social History  has no history on file for tobacco use, alcohol use, and drug use. Allergies  Allergies  Allergen Reactions  . Penicillins Rash   Home medications Prior to Admission medications   Medication Sig Start Date End Date Taking? Authorizing Provider  amLODipine (NORVASC) 10 MG tablet Take 10 mg by mouth daily. 09/25/19  Yes [provider]  aspirin EC 81 MG tablet Take 81 mg by mouth daily.   Yes [provider]  hydrALAZINE (APRESOLINE) 50 MG tablet Take 50 mg by mouth 3 (three) times daily. 10/30/19  Yes [provider]  HYDROcodone-acetaminophen (NORCO/VICODIN) 5-325 MG tablet Take 1 tablet by mouth 3 (three) times daily. 11/02/19  Yes [provider]  levETIRAcetam (KEPPRA) 500 MG tablet Take 500 mg by mouth 2 (two) times daily. For seizure 10/29/19  Yes [provider]  linaclotide (LINZESS) 145 MCG CAPS capsule Take 145 mcg by mouth daily before breakfast.   Yes [provider]  loratadine (CLARITIN) 10 MG tablet Take 10 mg by mouth daily. For sinus congestion 11/01/19  Yes [provider]  meloxicam ([place])  7.5 MG tablet Take 7.5 mg by mouth daily. Take with food - for pain 10/29/19  Yes [provider]  omeprazole (PRILOSEC) 20 MG capsule Take 20 mg by mouth daily. 10/30/19  Yes [provider]     Vitals:   12/03/19 1700 12/03/19 1800 12/03/19 1900 12/03/19 2000  BP: (!) 109/55 (!) 109/57 (!) 101/49 (!) 93/46  Pulse: 67 65 (!) 59 (!) 57  Resp: '13 18 19   ' Temp:    98.3 F (36.8 C)  TempSrc:    Axillary  SpO2: 100% 100% 100% 100%  Weight:      Height:       Exam Gen sedated on the vent NG tube w/ TF"s at 45/ hr IV precedex gtt No rash, cyanosis or gangrene Sclera anicteric, throat w + trach on vent  No jvd or bruits Chest clear bilat to bases  no rales or wheezing RRR no MRG Abd soft ntnd no mass or ascites +bs Rectal tube in place  GU w/ foley draining clear yellow urine MS no joint effusions or deformity Ext 1-2+ bilat UE edema, no sig LE edema Neuro is sedated, on vent     Home meds:  - norvasc 10/ asa 81/ hydralazine 50 tid  - prilosec 20/ mobic 7.5 qd/ linzess 145 qam  - keppra 500 bid/ norco prn    UA 6/19 - negative, 30 protein, 0-5 wbc/rbc   Renal US 6/19 - 10.6 and 9.8 cm kidneys, normal echo, no hydro    6/20  UNa 64, UCr 56    Na 149  K 5.1  CO2 19  BUN 85  Cr 2.13   Alb 2.4  LFT's okay  Hb 8.3  WBC 14K  plt 572    CXR last on 6/19 - no acute changes   Assessment/ Plan: 1. AoCKD 3 - b/l creat on admit was 1.25, eGFR 45 ml/min.  Creat up to 2.1 today, has been rising x last 3 days.  UA negative and renal US w/o hydro. Recent started IV vanc on 6/18 so not sure this is vanc toxicity. Get random level and get substitute for IV vanc, will d/w pharm/ primary. Did have transient hypotension on 6/17 possibly some tubular injury from hypotension.  Na+ is up, poss intravasc vol depletion. Urine lytes c/w ATN and /or CKD. Will dc PPI. Continue bicarb gtt at 50 /hr. Getting free H20 started today at 2L per day. Will follow.   2. Hypernatremia - started on 200 cc q 2h free water today 3. Acute CVA/ SAH - so EVD and aneurysm clipping 6/07 4. VDRF / sp trach - per CCM. CXR clear.  5. BP /volume - wt's stable, mild UE edema, possible some vol depletion. As above. BP's low to normal now, not on any BP lowering medication 6. H/o seizures 7. H/o prior CVA      Rebecca Mikal Wisman  MD 12/03/2019, 9:02 PM  Recent Labs  Lab 12/02/19 0002 12/02/19 0002 12/02/19 1128 12/03/19 0329  WBC 15.7*  --   --  14.1*  HGB 8.1*   < > 7.1* 8.3*   < > = values in this interval not displayed.   Recent Labs  Lab 12/02/19 0002 12/02/19 1128 12/03/19 0329 12/03/19 1548  K 5.0   < > 5.4* 5.1  BUN 69*   < > 76* 85*  CREATININE 1.71*   < >  1.94* 2.13*  CALCIUM 9.6   < > 9.6 9.7  PHOS 4.5  --  7.2*  --    < > = values in this interval not displayed.

## 2019-12-03 NOTE — Progress Notes (Signed)
Patient ID: [place], female   DOB: Nov 23, 1950, 69 y.o.   MRN: [id number] BP 136/63   Pulse 84   Temp 99.2 F (37.3 C) (Axillary)   Resp (!) 23   Ht 5\' 6"  (1.676 m)   Wt 78 kg   SpO2 100%   BMI 27.75 kg/m  Opens eyes, does not follow commands All extremities react to stimulation Remains ventilated Exam is stable, no real change

## 2019-12-03 NOTE — Progress Notes (Addendum)
Pharmacy Antibiotic Note  Rebecca Green is a 69 y.o. female admitted on 11/19/2019 with SAH. S/p trach on 6/17. Trach aspirate cultures are now growing staph epi. She did receive ceftriaxone for possible aspiration PNA but has been off antibiotics since 6/10. Pharmacy has been consulted for cefazolin dosing. Given high potential for resistance to cefazolin, it is recommended to transition to vancomycin until sensitivities result. CCM agrees.   WBC is decreasing. Tmax 100.6 -trending down.   Plan: Reduced vancomycin to 750 mg IV q24h Monitor renal function, cultures, vancomycin levels, and de-escalation/LOT   Addendum:  With rising SCr - stopping Vancomycin and changing to Linezolid therapy as discussed with Dr. Jonnie Finner and Hayden Pedro, NP CCM.   Height: 5\' 6"  (167.6 cm) Weight: 78 kg (171 lb 15.3 oz) IBW/kg (Calculated) : 59.3  Temp (24hrs), Avg:99.1 F (37.3 C), Min:97.9 F (36.6 C), Max:100.2 F (37.9 C)  Recent Labs  Lab 11/29/19 1227 11/29/19 2136 11/29/19 2357 11/30/19 1455 12/01/19 0607 12/02/19 0002 12/02/19 1954 12/03/19 0329  WBC 10.9* 10.0  --   --  13.5* 15.7*  --  14.1*  CREATININE 1.56*  --    < > 1.68* 1.68* 1.71* 1.78* 1.94*   < > = values in this interval not displayed.    Estimated Creatinine Clearance: 28.5 mL/min (A) (by C-G formula based on SCr of 1.94 mg/dL (H)).    Allergies  Allergen Reactions  . Penicillins Rash    Antimicrobials this admission: Ceftriaxone 6/7 >> 6/10 Cefazolin 6/18 x 1 Vancomycin 6/18 >>  Dose adjustments this admission: None  Microbiology results: 6/17 TA: few staph epi 6/8 TA: normal flora  6/6 MRSA PCR: neg  Thank you for allowing pharmacy to be a part of this patient's care.  Sloan Leiter, PharmD, BCPS, BCCCP Clinical Pharmacist Please refer to [facility] for Post numbers 12/03/2019       10:50 AM  Please check [email or web address] for unit-specific pharmacist phone numbers

## 2019-12-03 NOTE — Progress Notes (Addendum)
NAME:  [place], MRN:  [id number], DOB:  September 29, 1950, LOS: 48 ADMISSION DATE:  11/19/2019, CONSULTATION DATE:  11/20/19 REFERRING MD:  Zada Finders, CHIEF COMPLAINT:  SAH    Brief History   69 year old female with PMH significant for seizures, previous CVA's, HTN, and known left MCA aneurysm who reports for sudden unresponsiveness, left-sided facial droop, and right upper extremity weakness. CT Head revealed large volume left sided SAH with 39mm midline shift.   Past Medical History  CVA (No residual deficits) HTN Seizures DM II   Significant Hospital Events   6/06 Ad[facility] to [facility] 6/07 Aneurysm clipped. Acute decompensation - right frontal EVD placed. Intubated for airway protection 6/11 fever 101.3 6/12 change to pressure control 6/14 on PS wean 6/17 Trach Placed   Consults:  Neurology Neurosurgery  Procedures:  6/7 EVD >> 6/7 ETT > 6/17 6/7 Left pterional craniotomy for clipping of MCA aneurysm 6/17 Trach >>   Significant Diagnostic Tests:  6/7 CT Head > Large volume SAH overlying left cerebral hemisphere with  left to right midline shift. New hydrocephalus of lateral and third vents. SAH reflux into occipital horns of lateral vents.  6/6 CTA Neck > Patent common and internal carotids without significant stenosis. Mixed plaque within proximal left ICA. Patent vertebrals.  6/6 CTA Head > 3x2 mm left MCA bifurcation aneurysm (increased in size from April 2020). No intracranial LVO. High grade focal stenosis within inferior division mid M2 left MCA 6/9 renal u/s > Right renal cysts without complicating factors.  No hydronephrosis.  Micro Data:  6/6 SARS CoV2 > Negative 6/8 resp >  Normal oral flora 6/17 resp > Few Staph Epidermidis   Antimicrobials:  6/7 Rocephin > 6/10  Interim history/subjective:  No events overnight.   Objective   Blood pressure 136/63, pulse 84, temperature 99.2 F (37.3 C), temperature source Axillary, resp. rate (!) 23, height 5\' 6"   (1.676 m), weight 78 kg, SpO2 100 %.    Vent Mode: PCV FiO2 (%):  [40 %-60 %] 60 % Set Rate:  [15 bmp-20 bmp] 15 bmp PEEP:  [5 cmH20] 5 cmH20 Pressure Support:  [5 cmH20] 5 cmH20 Plateau Pressure:  [15 cmH20-24 cmH20] 23 cmH20   Intake/Output Summary (Last 24 hours) at 12/03/2019 0958 Last data filed at 12/03/2019 0900 Gross per 24 hour  Intake 2046.89 ml  Output 1071 ml  Net 975.89 ml   Filed Weights   11/28/19 0500 11/29/19 0500 12/02/19 0436  Weight: 79.8 kg 79.6 kg 78 kg    Examination: General - elderly female, lying in bed  HEENT: Trach in place >> clean, dry, intact. EVD in place  Cardiac - RRR, no MRG  Chest - coarse breath sounds, mild tachypnea   Abdomen - soft, non tender, + bowel sounds Extremities - 1+ pedal edema Skin - no rashes Neuro - alert, does not follow commands, pupils intact, withdrawal on RUE and LUE, LLE 2/5, RLE 3/5     Resolved Hospital Problem list     Assessment & Plan:   Acute hypoxic respiratory failure with compromised airway in setting of SAH. S/P Trach 6/17  Plan - Wean as tolerated. 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 -EVD placed 6/7 -Clipping 6/7 Hx of seizures. Plan  - Per Neurosurgery  - Nimotop - Keppra 500mg  BID -  TCDs - Continue EVD  Febrile. Leucocytosis. Sputum 6/17 with Staph Epidermidis > Concern for Asp PNA    -PCT 1.04  -CXR with no acute findings  Plan  - Trend WBC and Fever Curve - PAN Culture  - Continue Vancomycin (Started 6/18) > Plan to complete 7 days. Follow Culture Data May need to changed due to progressive Renal Injury.   Anemia Plan -Trend CBC -Transfuse for hemoglobin <7   Hypernatremia  Acute on CKD stage 2. Non gap metabolic acidosis. Hyperchloremia.  Hyperkalemia  Hyperphosphatemia  Plan - Trend BMP - Increase free water to  200 q2h >> Repeat BMP 1600 - Add Phos Binder - Continue low dose Bicarb gtt  - K 5.4 this AM >> will give Lokelma  - Send Urine Osmo and NA - Consulted Nephrology >> to See today   HTN. - Goal SBP < 160 - Continue norvasc, hydralazine  DM II poorly controlled with hyperglycemia. - SSI - Levemir 8u BID - Tube feed coverage  Best practice:  Diet: Tube feeds Pain/Anxiety/Delirium protocol (if indicated): na VAP protocol (if indicated): na DVT prophylaxis: Add Lovenox when OK with NSG GI prophylaxis: Protonix Glucose control: SSI, Levemir Mobility: Bed rest Code Status: Full code Family Communication: Will updated on plan of care.  Disposition: Continue ICU Care.    Critical care time: [place], AGACNP-BC [place] Pgr: [phone]

## 2019-12-03 NOTE — Progress Notes (Signed)
PT Cancellation Note  Patient Details Name: Kayte Borchard Haw[place]aker MRN: [id number] DOB: 04-19-1951   Cancelled Treatment:    Reason Eval/Treat Not Completed: Medical issues which prohibited therapy. Per discussion with RN pt not following commands and recently requiring start of sedatives. PT will hold at this time and return when pt is less sedated and better able to participate in skilled PT intervention.   Zenaida Niece 12/03/2019, 3:47 PM

## 2019-12-04 ENCOUNTER — Inpatient Hospital Stay (HOSPITAL_COMMUNITY): Payer: Medicare Other

## 2019-12-04 DIAGNOSIS — R609 Edema, unspecified: Secondary | ICD-10-CM

## 2019-12-04 DIAGNOSIS — I609 Nontraumatic subarachnoid hemorrhage, unspecified: Secondary | ICD-10-CM | POA: Diagnosis not present

## 2019-12-04 DIAGNOSIS — I82409 Acute embolism and thrombosis of unspecified deep veins of unspecified lower extremity: Secondary | ICD-10-CM

## 2019-12-04 DIAGNOSIS — I6012 Nontraumatic subarachnoid hemorrhage from left middle cerebral artery: Secondary | ICD-10-CM | POA: Diagnosis not present

## 2019-12-04 DIAGNOSIS — J9602 Acute respiratory failure with hypercapnia: Secondary | ICD-10-CM

## 2019-12-04 LAB — POCT I-STAT 7, (LYTES, BLD GAS, ICA,H+H)
Acid-base deficit: 4 mmol/L — ABNORMAL HIGH (ref 0.0–2.0)
Bicarbonate: 20.2 mmol/L (ref 20.0–28.0)
Calcium, Ion: 1.31 mmol/L (ref 1.15–1.40)
HCT: 22 % — ABNORMAL LOW (ref 36.0–46.0)
Hemoglobin: 7.5 g/dL — ABNORMAL LOW (ref 12.0–15.0)
O2 Saturation: 96 %
Patient temperature: 99.6
Potassium: 4.8 mmol/L (ref 3.5–5.1)
Sodium: 144 mmol/L (ref 135–145)
TCO2: 21 mmol/L — ABNORMAL LOW (ref 22–32)
pCO2 arterial: 33 mmHg (ref 32.0–48.0)
pH, Arterial: 7.398 (ref 7.350–7.450)
pO2, Arterial: 83 mmHg (ref 83.0–108.0)

## 2019-12-04 LAB — BASIC METABOLIC PANEL
Anion gap: 10 (ref 5–15)
Anion gap: 10 (ref 5–15)
BUN: 85 mg/dL — ABNORMAL HIGH (ref 8–23)
BUN: 84 mg/dL — ABNORMAL HIGH (ref 8–23)
CO2: 19 mmol/L — ABNORMAL LOW (ref 22–32)
CO2: 18 mmol/L — ABNORMAL LOW (ref 22–32)
Calcium: 9.4 mg/dL (ref 8.9–10.3)
Calcium: 9 mg/dL (ref 8.9–10.3)
Chloride: 117 mmol/L — ABNORMAL HIGH (ref 98–111)
Chloride: 113 mmol/L — ABNORMAL HIGH (ref 98–111)
Creatinine, Ser: 1.85 mg/dL — ABNORMAL HIGH (ref 0.44–1.00)
Creatinine, Ser: 1.74 mg/dL — ABNORMAL HIGH (ref 0.44–1.00)
GFR calc Af Amer: 31 mL/min — ABNORMAL LOW (ref >60)
GFR calc Af Amer: 34 mL/min — ABNORMAL LOW (ref >60)
GFR calc non Af Amer: 27 mL/min — ABNORMAL LOW (ref >60)
GFR calc non Af Amer: 29 mL/min — ABNORMAL LOW (ref >60)
Glucose, Bld: 71 mg/dL (ref 70–99)
Glucose, Bld: 197 mg/dL — ABNORMAL HIGH (ref 70–99)
Potassium: 4.5 mmol/L (ref 3.5–5.1)
Potassium: 4.6 mmol/L (ref 3.5–5.1)
Sodium: 146 mmol/L — ABNORMAL HIGH (ref 135–145)
Sodium: 141 mmol/L (ref 135–145)

## 2019-12-04 LAB — PHOSPHORUS: Phosphorus: 5.8 mg/dL — ABNORMAL HIGH (ref 2.5–4.6)

## 2019-12-04 LAB — CBC
HCT: 23.9 % — ABNORMAL LOW (ref 36.0–46.0)
Hemoglobin: 7.4 g/dL — ABNORMAL LOW (ref 12.0–15.0)
MCH: 30.7 pg (ref 26.0–34.0)
MCHC: 31 g/dL (ref 30.0–36.0)
MCV: 99.2 fL (ref 80.0–100.0)
Platelets: 524 10*3/uL — ABNORMAL HIGH (ref 150–400)
RBC: 2.41 MIL/uL — ABNORMAL LOW (ref 3.87–5.11)
RDW: 15.9 % — ABNORMAL HIGH (ref 11.5–15.5)
WBC: 9.6 10*3/uL (ref 4.0–10.5)
nRBC: 0.2 % (ref 0.0–0.2)

## 2019-12-04 LAB — GLUCOSE, CAPILLARY
Glucose-Capillary: 52 mg/dL — ABNORMAL LOW (ref 70–99)
Glucose-Capillary: 46 mg/dL — ABNORMAL LOW (ref 70–99)
Glucose-Capillary: 48 mg/dL — ABNORMAL LOW (ref 70–99)
Glucose-Capillary: 50 mg/dL — ABNORMAL LOW (ref 70–99)
Glucose-Capillary: 92 mg/dL (ref 70–99)
Glucose-Capillary: 118 mg/dL — ABNORMAL HIGH (ref 70–99)
Glucose-Capillary: 198 mg/dL — ABNORMAL HIGH (ref 70–99)

## 2019-12-04 LAB — MAGNESIUM: Magnesium: 2.6 mg/dL — ABNORMAL HIGH (ref 1.7–2.4)

## 2019-12-04 MED ORDER — FAMOTIDINE 40 MG/5ML PO SUSR
20.0000 mg | Freq: Every day | ORAL | Status: DC
  Administered 2019-12-04 – 2019-12-29 (×24): 20 mg
  Filled 2019-12-04 (×25): qty 2.5

## 2019-12-04 MED ORDER — DEXTROSE 50 % IV SOLN
INTRAVENOUS | Status: AC
  Administered 2019-12-04: 50 mL
  Filled 2019-12-04: qty 50

## 2019-12-04 MED ORDER — INSULIN DETEMIR 100 UNIT/ML ~~LOC~~ SOLN
12.0000 [IU] | Freq: Every day | SUBCUTANEOUS | Status: DC
  Administered 2019-12-04 – 2019-12-12 (×8): 12 [IU] via SUBCUTANEOUS
  Filled 2019-12-04 (×10): qty 0.12

## 2019-12-04 MED ORDER — INSULIN ASPART 100 UNIT/ML ~~LOC~~ SOLN: 6.0000 [IU] | SUBCUTANEOUS | Status: DC

## 2019-12-04 MED ORDER — INSULIN ASPART 100 UNIT/ML ~~LOC~~ SOLN
0.0000 [IU] | SUBCUTANEOUS | Status: DC
  Administered 2019-12-04: 1 [IU] via SUBCUTANEOUS
  Administered 2019-12-04 – 2019-12-05 (×4): 2 [IU] via SUBCUTANEOUS
  Administered 2019-12-05 (×2): 1 [IU] via SUBCUTANEOUS
  Administered 2019-12-05 – 2019-12-06 (×3): 2 [IU] via SUBCUTANEOUS
  Administered 2019-12-06: 1 [IU] via SUBCUTANEOUS
  Administered 2019-12-06 – 2019-12-07 (×2): 2 [IU] via SUBCUTANEOUS
  Administered 2019-12-07: 3 [IU] via SUBCUTANEOUS
  Administered 2019-12-07: 1 [IU] via SUBCUTANEOUS
  Administered 2019-12-08: 2 [IU] via SUBCUTANEOUS
  Administered 2019-12-08: 5 [IU] via SUBCUTANEOUS
  Administered 2019-12-08: 3 [IU] via SUBCUTANEOUS
  Administered 2019-12-08: 2 [IU] via SUBCUTANEOUS
  Administered 2019-12-08 (×2): 3 [IU] via SUBCUTANEOUS
  Administered 2019-12-09: 2 [IU] via SUBCUTANEOUS
  Administered 2019-12-09: 3 [IU] via SUBCUTANEOUS
  Administered 2019-12-09: 0 [IU] via SUBCUTANEOUS
  Administered 2019-12-09 (×2): 3 [IU] via SUBCUTANEOUS
  Administered 2019-12-09: 2 [IU] via SUBCUTANEOUS
  Administered 2019-12-09: 3 [IU] via SUBCUTANEOUS
  Administered 2019-12-10: 1 [IU] via SUBCUTANEOUS
  Administered 2019-12-10 (×2): 3 [IU] via SUBCUTANEOUS
  Administered 2019-12-10: 2 [IU] via SUBCUTANEOUS
  Administered 2019-12-11 (×2): 1 [IU] via SUBCUTANEOUS
  Administered 2019-12-11: 2 [IU] via SUBCUTANEOUS
  Administered 2019-12-11 (×2): 1 [IU] via SUBCUTANEOUS
  Administered 2019-12-12: 5 [IU] via SUBCUTANEOUS
  Administered 2019-12-12: 2 [IU] via SUBCUTANEOUS
  Administered 2019-12-12: 3 [IU] via SUBCUTANEOUS

## 2019-12-04 MED ORDER — INSULIN GLARGINE 100 UNIT/ML ~~LOC~~ SOLN
12.0000 [IU] | Freq: Two times a day (BID) | SUBCUTANEOUS | Status: DC
  Filled 2019-12-04 (×2): qty 0.12

## 2019-12-04 NOTE — Progress Notes (Signed)
Bilateral lower extremity venous duplex has been completed. Preliminary results can be found in CV Proc through chart review.  Results were given to the patient's nurse, Amy.  12/04/19 2:50 PM Rebecca Green RVT

## 2019-12-04 NOTE — Progress Notes (Signed)
Upper venous duplex       has been completed. Preliminary results can be found under CV proc through chart review. Wandell Scullion, BS, RDMS, RVT   

## 2019-12-04 NOTE — Evaluation (Signed)
Occupational Therapy Evaluation Patient Details Name: Rebecca Green MRN: [id number] DOB: 1951/04/09 Today's Date: 12/04/2019    History of Present Illness Pt is a 69 yo female with ho of HTN, CVAs and known L MCA aneurysm that became unresponsive on 6/6.  Pt now iwth L SAH with midline shift.  Pt had aneurysm clipped and ventriculostomy placed. Pt with trach placed on 6/17.  Pt on ventilator with R side weakness.    Clinical Impression   Pt admitted with the above diagnosis and has the deficits outlined below. Pt would benefit from cont OT to increase independence with basic adls and adl mobility so she can participate more in therapy and increase her ability to care for herself.  Pt's family not available during eval so unsure of pts discharge plan. If pt has 24 hour assist and becomes more responsive, pt would benefit from inpt rehab.  If pt does not have 24 hour assist after d/c and has to live alone, pt will require SNF rehab.  Will continue to follow acutely focusing on increasing time on EOB to hopefully increase participation in therapy.     Follow Up Recommendations  SNF;Supervision/Assistance - 24 hour    Equipment Recommendations  Other (comment) (tbd)    Recommendations for Other Services       Precautions / Restrictions Precautions Precaution Comments: Pt with multiple lines including ventric, vent, feeding tube, multiple IVs, rectal tube.  Restrictions Weight Bearing Restrictions: No      Mobility Bed Mobility Overal bed mobility: Needs Assistance Bed Mobility: Rolling;Supine to Sit;Sit to Supine Rolling: Total assist;+2 for physical assistance   Supine to sit: +2 for physical assistance;Total assist Sit to supine: Total assist;+2 for physical assistance   General bed mobility comments: Pt total assist for all mobility.  Transfers Overall transfer level: Needs assistance               General transfer comment: Pt was not transfered as pt is total assist  for all bed mobility.  Pt would need use of lift if transferred.    Balance Overall balance assessment: Needs assistance Sitting-balance support: Feet supported Sitting balance-Leahy Scale: Zero         Standing balance comment: Pt unable to stand.                           ADL either performed or assessed with clinical judgement   ADL Overall ADL's : Needs assistance/impaired Eating/Feeding: NPO   Grooming: Total assistance   Upper Body Bathing: Total assistance   Lower Body Bathing: Total assistance;+2 for physical assistance   Upper Body Dressing : Total assistance   Lower Body Dressing: Total assistance;+2 for physical assistance       Toileting- Clothing Manipulation and Hygiene: Total assistance;+2 for physical assistance       Functional mobility during ADLs: Total assistance;+2 for physical assistance General ADL Comments: Pt is fully dependent for all adls, on vent, trach and not following commands. Pt was alert and did scan to find therapist in room.     Vision Baseline Vision/History: No visual deficits Vision Assessment?: Vision impaired- to be further tested in functional context Additional Comments: Unsure about vision at this time.  Needs to be evaluated when pt following some commands.     Perception Perception Perception Tested?: Yes Perception Deficits: Inattention/neglect Inattention/Neglect: Does not attend to right visual field;Does not attend to right side of body   Praxis Praxis Praxis  tested?: Not tested    Pertinent Vitals/Pain Pain Assessment: Faces Faces Pain Scale: Hurts a little bit Pain Location: unsure.  Grimacing upon sitting up. Pain Descriptors / Indicators: Grimacing Pain Intervention(s): Limited activity within patient's tolerance;Monitored during session;Repositioned     Hand Dominance     Extremity/Trunk Assessment Upper Extremity Assessment Upper Extremity Assessment: RUE deficits/detail;LUE  deficits/detail RUE Deficits / Details: PROM WFL.  No active movement noted.  Unsure about sensation.  RUE very swollen. RUE Coordination: decreased fine motor;decreased gross motor LUE Deficits / Details: PROM WFL.  Did not move to command but did bend elbow up to scratch her chest when getting up.    Lower Extremity Assessment Lower Extremity Assessment: Defer to PT evaluation   Cervical / Trunk Assessment Cervical / Trunk Assessment: Other exceptions Cervical / Trunk Exceptions: Lengthening on R side of neck due to L head turn and gaze preference.   Communication Communication Communication: Other (comment) (unable to evaluate)   Cognition Arousal/Alertness: Awake/alert Behavior During Therapy: Flat affect Overall Cognitive Status: No family/caregiver present to determine baseline cognitive functioning                                 General Comments: Pt with significant cognitive deficits.  Not following any commands today.  Did scan to find therapist in room.  Unable to fully evaluate as pt on vent.   General Comments  Pt fully dependent at this time.  Swelling noted in BUES R more than Left.      Exercises     Shoulder Instructions      Home Living Family/patient expects to be discharged to:: Unsure Living Arrangements: Alone                                      Prior Functioning/Environment Level of Independence: Independent        Comments: no family present to get full history        OT Problem List: Decreased strength;Decreased range of motion;Decreased activity tolerance;Impaired balance (sitting and/or standing);Impaired vision/perception;Decreased coordination;Decreased cognition;Decreased safety awareness;Decreased knowledge of use of DME or AE;Decreased knowledge of precautions;Cardiopulmonary status limiting activity;Impaired sensation;Impaired tone;Obesity;Impaired UE functional use;Pain;Increased edema      OT  Treatment/Interventions: Self-care/ADL training;Therapeutic activities;Balance training;DME and/or AE instruction    OT Goals(Current goals can be found in the care plan section) Acute Rehab OT Goals Patient Stated Goal: unable to state OT Goal Formulation: Patient unable to participate in goal setting Time For Goal Achievement: 12/18/19 Potential to Achieve Goals: Fair ADL Goals Additional ADL Goal #1: Pt will sit EOB for 5 minutes with mod assist to improve balance to groom on EOB. Additional ADL Goal #2: Pt will follow 75% of command to increase participation in therapy. Additional ADL Goal #3: Pt iwill scan to L and R of bedside table to locate and use grooming items with moderate cues. Additional ADL Goal #4: Pt with perform one simple grooming task with mod assist sitting EOB.  OT Frequency: Min 2X/week   Barriers to D/C: Decreased caregiver support  Pt most likely lives alone.  Unsure of support after d/c       Co-evaluation PT/OT/SLP Co-Evaluation/Treatment: Yes Reason for Co-Treatment: Complexity of the patient's impairments (multi-system involvement) PT goals addressed during session: Mobility/safety with mobility;Balance OT goals addressed during session: ADL's and self-care  AM-PAC OT "6 Clicks" Daily Activity     Outcome Measure Help from another person eating meals?: Total Help from another person taking care of personal grooming?: Total Help from another person toileting, which includes using toliet, bedpan, or urinal?: Total Help from another person bathing (including washing, rinsing, drying)?: Total Help from another person to put on and taking off regular upper body clothing?: Total Help from another person to put on and taking off regular lower body clothing?: Total 6 Click Score: 6   End of Session Equipment Utilized During Treatment: Oxygen Nurse Communication: Mobility status  Activity Tolerance: Patient limited by lethargy Patient left: in bed;with  call bell/phone within reach;with restraints reapplied  OT Visit Diagnosis: Other symptoms and signs involving the nervous system (R29.898);Other symptoms and signs involving cognitive function;Hemiplegia and hemiparesis Hemiplegia - Right/Left: Right Hemiplegia - caused by: Nontraumatic SAH                Time: 7395-8441 OT Time Calculation (min): 21 min Charges:  OT General Charges $OT Visit: 1 Visit OT Evaluation $OT Eval High Complexity: 1 High  Glenford Peers 12/04/2019, 8:48 AM

## 2019-12-04 NOTE — Progress Notes (Signed)
[facility] Progress Note    Assessment/ Plan:   1. AoCKD 3 - b/l creat on admit was 1.25, eGFR 45 ml/min.  Creat up to 2.1 today, has been rising x last 3 days.  UA negative and renal US w/o hydro. Recent started IV vanc on 6/18.  Did have transient hypotension on 6/17 possibly some tubular injury from hypotension.  Na+ is up, poss intravasc vol depletion.  Improved with switching to Zyvox, stopping PPI, and increasing free water (vol depletion likely a major factor here to AKI)  Would avoid hypotension, other nephrotoxic agents.  Not a dialysis candidate and fortunately no indications.  UA not suggestive of GN or AIN  Nothing really to add further--> will sign off.  Call with questions 2. Hypernatremia - started on 200 cc q 2h free water today 3. Acute CVA/ SAH - so EVD and aneurysm clipping 6/07 4. VDRF / sp trach - per CCM. CXR clear.  5. BP /volume - wt's stable, mild UE edema, possible some vol depletion. As above. BP's low to normal now, not on any BP lowering medication 6. H/o seizures 7. H/o prior CVA  Subjective:    Cr improving with switching to Zyvox and increasing free water.  UA bland except for 30 mg protein on UA   Objective:   BP (!) 102/58   Pulse 75   Temp 99.6 F (37.6 C) (Axillary)   Resp 20   Ht 5' 6" (1.676 m)   Wt 78 kg   SpO2 100%   BMI 27.75 kg/m   Intake/Output Summary (Last 24 hours) at 12/04/2019 1057 Last data filed at 12/04/2019 1000 Gross per 24 hour  Intake 4430.37 ml  Output 1898 ml  Net 2532.37 ml   Weight change:   Physical Exam: Gen: NAD, lying in bed HEENT: trach in place CVS: RRR Resp: clear bilaterally Abd: soft Ext: no LE edema, LUE ++ edema SKIN: poor skin turgor  Imaging: US RENAL  Result Date: 12/02/2019 CLINICAL DATA:  69 year old female with acute renal insufficiency. EXAM: RENAL / URINARY TRACT ULTRASOUND COMPLETE COMPARISON:  Renal ultrasound dated 11/22/2019. FINDINGS: Right Kidney: Renal measurements:  10.6 x 4.7 x 4.4 cm = volume: 115 mL. Normal echogenicity. No hydronephrosis or shadowing stone. There is a 2 cm upper pole cyst as seen previously. Left Kidney: Renal measurements: 9.8 x 5.7 x 5.9 cm = volume: 172 mL. Normal echogenicity. No hydronephrosis or shadowing stone. The left kidney suboptimally visualized due to overlying bowel gas. Bladder: Not visualized. Other: None. IMPRESSION: 1. No hydronephrosis or shadowing stone. 2. Stable right renal upper pole cyst. Electronically Signed   By: Anner Crete M.D.   On: 12/02/2019 16:03   VAS [place] UPPER EXTREMITY VENOUS DUPLEX  Result Date: 12/04/2019 UPPER VENOUS STUDY  Indications: pitting edema of arms, and stroke Limitations: Bandages and line. Comparison Study: none Performing Technologist: June Leap RDMS, RVT  Examination Guidelines: A complete evaluation includes B-mode imaging, spectral Doppler, color Doppler, and power Doppler as needed of all accessible portions of each vessel. Bilateral testing is considered an integral part of a complete examination. Limited examinations for reoccurring indications may be performed as noted.  Right Findings: +----------+------------+---------+-----------+----------+---------------------+ RIGHT     CompressiblePhasicitySpontaneousProperties       Summary        +----------+------------+---------+-----------+----------+---------------------+ IJV  Not visualized     +----------+------------+---------+-----------+----------+---------------------+ Subclavian    Full       Yes       Yes                                    +----------+------------+---------+-----------+----------+---------------------+ Axillary      Full       Yes       Yes                                    +----------+------------+---------+-----------+----------+---------------------+ Brachial      Full       Yes       Yes                not all segments                                                              visualized       +----------+------------+---------+-----------+----------+---------------------+ Radial        Full                                                        +----------+------------+---------+-----------+----------+---------------------+ Ulnar                                                  Not visualized     +----------+------------+---------+-----------+----------+---------------------+ Cephalic      Full                                                        +----------+------------+---------+-----------+----------+---------------------+ Basilic                                                Not visualized     +----------+------------+---------+-----------+----------+---------------------+  Left Findings: +----------+------------+---------+-----------+----------+--------------+ LEFT      CompressiblePhasicitySpontaneousProperties   Summary     +----------+------------+---------+-----------+----------+--------------+ IJV                                                 Not visualized +----------+------------+---------+-----------+----------+--------------+ Subclavian    Full       Yes       Yes                             +----------+------------+---------+-----------+----------+--------------+ Axillary      None       No  No                              +----------+------------+---------+-----------+----------+--------------+ Brachial      None       No        Yes                             +----------+------------+---------+-----------+----------+--------------+ Radial        Full                                                 +----------+------------+---------+-----------+----------+--------------+ Ulnar         Full                                                 +----------+------------+---------+-----------+----------+--------------+ Cephalic      None                                    AC/ forearm   +----------+------------+---------+-----------+----------+--------------+ Basilic       Full                                                 +----------+------------+---------+-----------+----------+--------------+  Summary:  Right: No evidence of deep vein thrombosis in the upper extremity. No evidence of superficial vein thrombosis in the upper extremity. Limited study.  Left: Findings consistent with acute deep vein thrombosis involving the left axillary vein and left brachial veins. Findings consistent with acute superficial vein thrombosis involving the left cephalic vein. Limited study.  *See table(s) above for measurements and observations.    Preliminary     Labs: BMET Recent Labs  Lab 11/30/19 0656 11/30/19 0656 11/30/19 1455 11/30/19 1455 12/01/19 2458 12/01/19 0607 12/02/19 0002 12/02/19 1128 12/02/19 1954 12/03/19 0329 12/03/19 1548 12/04/19 0445 12/04/19 0857  NA 150*   < > 151*   < > 150*   < > 152* 153* 150* 150* 149* 146* 144  K 5.7*   < > 5.2*   < > 4.7   < > 5.0 4.7 4.9 5.4* 5.1 4.5 4.8  CL 126*   < > 125*  --  126*  --  128*  --  123* 121* 120* 117*  --   CO2 16*   < > 17*  --  15*  --  13*  --  15* 18* 19* 19*  --   GLUCOSE 209*   < > 170*  --  172*  --  130*  --  90 74 114* 71  --   BUN 63*   < > 67*  --  69*  --  69*  --  72* 76* 85* 85*  --   CREATININE 1.42*   < > 1.68*  --  1.68*  --  1.71*  --  1.78* 1.94* 2.13* 1.85*  --   CALCIUM 9.7   < > 9.6  --  9.5  --  9.6  --  9.5 9.6 9.7 9.4  --   PHOS 5.1*  --   --   --  5.1*  --  4.5  --   --  7.2*  --  5.8*  --    < > = values in this interval not displayed.   CBC Recent Labs  Lab 12/01/19 0607 12/01/19 0607 12/02/19 0002 12/02/19 0002 12/02/19 1128 12/03/19 0329 12/04/19 0445 12/04/19 0857  WBC 13.5*  --  15.7*  --   --  14.1* 9.6  --   HGB 7.7*   < > 8.1*   < > 7.1* 8.3* 7.4* 7.5*  HCT 25.1*   < > 26.9*   < > 21.0* 28.0* 23.9* 22.0*  MCV 98.0   --  100.4*  --   --  101.8* 99.2  --   PLT 480*  --  491*  --   --  572* 524*  --    < > = values in this interval not displayed.    Medications:    . calcium acetate (Phos Binder)  1,334 mg Per Tube TID  . chlorhexidine gluconate (MEDLINE KIT)  15 mL Mouth Rinse BID  . Chlorhexidine Gluconate Cloth  6 each Topical Q0600  . docusate  100 mg Per Tube BID  . famotidine  20 mg Per Tube Daily  . feeding supplement (PRO-STAT SUGAR FREE 64)  60 mL Per Tube BID  . free water  200 mL Per Tube Q2H  . insulin aspart  0-9 Units Subcutaneous Q4H  . insulin detemir  12 Units Subcutaneous Daily  . ipratropium-albuterol  3 mL Nebulization Q4H  . levETIRAcetam  500 mg Per Tube BID  . mouth rinse  15 mL Mouth Rinse 10 times per day  . multivitamin with minerals  1 tablet Per Tube Daily  . niMODipine  60 mg Per Tube Q4H   Or  . niMODipine  60 mg Oral Q4H  . polyethylene glycol  17 g Per Tube Daily  . senna  1 tablet Per Tube BID  . sodium chloride flush  10-40 mL Intracatheter Q12H      Madelon Lips, MD 12/04/2019, 10:57 AM

## 2019-12-04 NOTE — Progress Notes (Addendum)
Pt noted to have labored, rapid breathing and an increase in HR. Fentanyl x2 administered with no relief. . RT to bedside, Elink MD virtually present. Vent settings adjusted with associated desaturation, placed back on PC 20/5. Inner canula changed with increase in sats, improved work of breathing.   0340: glucose 48, given 1amp D50 @0340  0414: glucose 46, given 1amp D50 @ 0431 (delay d/t discussing results/pt status with MD). Pt edematous, accuracy of CBG in question. Ordered stat lab verification, which was 71 s/p 2 amps D50. Repeat CBG @ 0623 is 92. Elink MD aware, see orders.

## 2019-12-04 NOTE — Evaluation (Signed)
Physical Therapy Evaluation Patient Details Name: Rebecca Green MRN: [id number] DOB: Sep 28, 1950 Today's Date: 12/04/2019   History of Present Illness  Pt is a 69 yo female with ho of HTN, CVAs and known L MCA aneurysm that became unresponsive on 6/6.  Pt now iwth L SAH with midline shift.  Pt had aneurysm clipped and ventriculostomy placed. Pt with trach placed on 6/17.  Pt on ventilator with R side weakness.     Clinical Impression  Pt admitted with above. Pt with no command follow however did appear to track OT with eye open. Pt reactive with eyes during ROM and transfers. Pt with minimal active movement of extremities, pt did use L hand to scratch chest x1. Pt dependent for all mobility. At this time due to being trach vent dependent recommending LTACH however will continue to assess throughout therapy sessions. Acute PT to cont to follow.    Follow Up Recommendations LTACH (will continue to assess)    Equipment Recommendations  Other (comment) (TBD)    Recommendations for Other Services       Precautions / Restrictions Precautions Precaution Comments: Pt with multiple lines including ventric, vent, feeding tube, multiple IVs, rectal tube.  Restrictions Weight Bearing Restrictions: No      Mobility  Bed Mobility Overal bed mobility: Needs Assistance Bed Mobility: Rolling;Supine to Sit;Sit to Supine Rolling: Total assist;+2 for physical assistance   Supine to sit: +2 for physical assistance;Total assist Sit to supine: Total assist;+2 for physical assistance   General bed mobility comments: pt no initiation of transfer, no voluntary assist, did open up eyes and changed facial expression with transition to EOB  Transfers Overall transfer level: Needs assistance               General transfer comment: transfer deferred due to first time up, pt would need to used maxi sky to get into chair at this time  Ambulation/Gait                Stairs             Wheelchair Mobility    Modified Rankin (Stroke Patients Only) Modified Rankin (Stroke Patients Only) Pre-Morbid Rankin Score: No significant disability Modified Rankin: Severe disability     Balance Overall balance assessment: Needs assistance Sitting-balance support: Feet supported Sitting balance-Leahy Scale: Zero Sitting balance - Comments: pt dependent to maintain EOB balance, worked on trunk rotation and neck rotation due to staying in bed for the last 2 weeks       Standing balance comment: Pt unable to stand.                             Pertinent Vitals/Pain Pain Assessment: Faces Faces Pain Scale: Hurts a little bit Pain Location: unsure.  Grimacing upon sitting up. Pain Descriptors / Indicators: Grimacing Pain Intervention(s): Limited activity within patient's tolerance    Home Living Family/patient expects to be discharged to:: Unsure Living Arrangements: Alone               Additional Comments: per RN, she believes she lives alone but hasn't spoken to family as they havent visited    Prior Function Level of Independence: Independent         Comments: no family present to get full history     Hand Dominance        Extremity/Trunk Assessment   Upper Extremity Assessment Upper Extremity Assessment: RUE deficits/detail;LUE deficits/detail RUE  Deficits / Details: PROM WFL.  No active movement noted.  Unsure about sensation.  RUE very swollen. RUE Coordination: decreased fine motor;decreased gross motor LUE Deficits / Details: PROM WFL.  Did not move to command but did bend elbow up to scratch her chest when getting up.     Lower Extremity Assessment Lower Extremity Assessment: Difficult to assess due to impaired cognition (pt with no command follow, tolerated PROM to bilat LE)    Cervical / Trunk Assessment Cervical / Trunk Assessment: Other exceptions Cervical / Trunk Exceptions: worked on trunk rotation and neck rotation due  to pro longed bed rest of 2 weeks  Communication   Communication: Other (comment) (unable to evaluate)  Cognition Arousal/Alertness: Awake/alert Behavior During Therapy: Flat affect Overall Cognitive Status: No family/caregiver present to determine baseline cognitive functioning                                 General Comments: Pt with significant cognitive deficits.  Not following any commands today.  Did scan to find therapist in room.  Unable to fully evaluate as pt on vent.      General Comments General comments (skin integrity, edema, etc.): VSS, pt continues to be on full support on vent    Exercises     Assessment/Plan    PT Assessment Patient needs continued PT services  PT Problem List Decreased strength;Decreased range of motion;Decreased activity tolerance;Decreased balance;Decreased mobility;Decreased coordination;Decreased cognition;Decreased knowledge of use of DME;Decreased safety awareness;Decreased knowledge of precautions       PT Treatment Interventions DME instruction;Gait training;Stair training;Functional mobility training;Therapeutic activities;Therapeutic exercise;Balance training;Neuromuscular re-education;Cognitive remediation    PT Goals (Current goals can be found in the Care Plan section)  Acute Rehab PT Goals Patient Stated Goal: unable to state PT Goal Formulation: Patient unable to participate in goal setting Time For Goal Achievement: 12/18/19 Potential to Achieve Goals: Fair Additional Goals Additional Goal #1: Pt to follow 1 step commands to actively participate in therapy.    Frequency Min 2X/week   Barriers to discharge        Co-evaluation PT/OT/SLP Co-Evaluation/Treatment: Yes Reason for Co-Treatment: Complexity of the patient's impairments (multi-system involvement) PT goals addressed during session: Mobility/safety with mobility OT goals addressed during session: ADL's and self-care       AM-PAC PT "6 Clicks"  Mobility  Outcome Measure Help needed turning from your back to your side while in a flat bed without using bedrails?: Total Help needed moving from lying on your back to sitting on the side of a flat bed without using bedrails?: Total Help needed moving to and from a bed to a chair (including a wheelchair)?: Total Help needed standing up from a chair using your arms (e.g., wheelchair or bedside chair)?: Total Help needed to walk in hospital room?: Total Help needed climbing 3-5 steps with a railing? : Total 6 Click Score: 6    End of Session Equipment Utilized During Treatment: Gait belt Activity Tolerance: Patient tolerated treatment well Patient left: in bed;with call bell/phone within reach;with nursing/sitter in room Nurse Communication: Mobility status PT Visit Diagnosis: Unsteadiness on feet (R26.81);Muscle weakness (generalized) (M62.81);Difficulty in walking, not elsewhere classified (R26.2);Hemiplegia and hemiparesis Hemiplegia - Right/Left: Right Hemiplegia - caused by: Nontraumatic SAH    Time: 2992-4268 PT Time Calculation (min) (ACUTE ONLY): 22 min   Charges:   PT Evaluation $PT Eval Moderate Complexity: 1 Mod  Kittie Plater, PT, DPT Acute Rehabilitation Services Pager #: [phone] Office #: [phone]   Berline Lopes 12/04/2019, 10:45 AM

## 2019-12-04 NOTE — Significant Event (Signed)
+  DVT to Left Upper Extremity. Lower Extremity Doppler ordered to R/O DVT. If Lower + Can Order IVC filter placement.

## 2019-12-04 NOTE — Progress Notes (Addendum)
NAME:  Rebecca Green, MRN:  [id number], DOB:  [birth year], LOS: 71 ADMISSION DATE:  11/19/2019, CONSULTATION DATE:  11/20/19 REFERRING MD:  Zada Finders, CHIEF COMPLAINT:  SAH    Brief History   69 year old female with PMH significant for seizures, previous CVA's, HTN, and known left MCA aneurysm who reports for sudden unresponsiveness, left-sided facial droop, and right upper extremity weakness. CT Head revealed large volume left sided SAH with 36mm midline shift.   Past Medical History  CVA (No residual deficits) HTN Seizures DM II   Significant Hospital Events   6/06 Admitted to [facility] 6/07 Aneurysm clipped. Acute decompensation - right frontal EVD placed. Intubated for airway protection 6/11 fever 101.3 6/12 change to pressure control 6/14 on PS wean 6/17 Trach Placed   Consults:  Neurology Neurosurgery  Procedures:  6/7 EVD >> 6/7 ETT > 6/17 6/7 Left pterional craniotomy for clipping of MCA aneurysm 6/17 Trach >>   Significant Diagnostic Tests:  6/7 CT Head > Large volume SAH overlying left cerebral hemisphere with  left to right midline shift. New hydrocephalus of lateral and third vents. SAH reflux into occipital horns of lateral vents.  6/6 CTA Neck > Patent common and internal carotids without significant stenosis. Mixed plaque within proximal left ICA. Patent vertebrals.  6/6 CTA Head > 3x2 mm left MCA bifurcation aneurysm (increased in size from April 2020). No intracranial LVO. High grade focal stenosis within inferior division mid M2 left MCA 6/9 renal u/s > Right renal cysts without complicating factors.  No hydronephrosis. 6/19 renal u/s> No hydronephrosis or shadowing stone. 2. Stable right renal upper pole cyst.  Micro Data:  6/6 SARS CoV2 > Negative 6/8 resp >  Normal oral flora 6/17 resp > Few Staph Epidermidis   Antimicrobials:  6/7 Rocephin > 6/10  Interim history/subjective:  Overnight with tachypnea and tachycardia with hypoxia and  hypoglycemia.   Objective   Blood pressure (!) 142/82, pulse 64, temperature 99.6 F (37.6 C), temperature source Axillary, resp. rate (!) 26, height 5\' 6"  (1.676 m), weight 78 kg, SpO2 100 %.    Vent Mode: PCV FiO2 (%):  [40 %-60 %] 40 % Set Rate:  [15 bmp] 15 bmp PEEP:  [5 cmH20] 5 cmH20 Plateau Pressure:  [16 cmH20-20 cmH20] 16 cmH20   Intake/Output Summary (Last 24 hours) at 12/04/2019 0836 Last data filed at 12/04/2019 0800 Gross per 24 hour  Intake 4437.33 ml  Output 1890 ml  Net 2547.33 ml   Filed Weights   11/28/19 0500 11/29/19 0500 12/02/19 0436  Weight: 79.8 kg 79.6 kg 78 kg    Examination: General - elderly female, lying in bed  HEENT: Trach in place >> clean, dry, intact. EVD in place  Cardiac - RRR, no MRG  Chest - coarse breath sounds, no distress noted, no use of accessory muscles, no wheeze/crackles  Abdomen - soft, non tender, active bowel sounds Extremities - 1+ pedal edema, Bilateral upper extremity pitting edema Skin - no rashes Neuro - alert, does not follow commands, pupils intact, withdrawal LUE, RUE 1/5, LLE 3/5, RLE 1/5     Resolved Hospital Problem list     Assessment & Plan:   Acute hypoxic respiratory failure with compromised airway in setting of SAH. S/P Trach 6/17  Plan - Wean as tolerated. Trach Collar when able.>> Currently on PC - Titrate Supplemental Oxygen to Achieve SpO2 > 92% - VAP Bundle  - Trend CXR   Acute left-sided SAH with hydrocephalus,  likely ruptured left MCA aneurysm. -EVD placed 6/7 -Clipping 6/7 Hx of seizures. Plan  - Per Neurosurgery  - Nimotop - Keppra 500mg  BID - TCDs - Continue EVD  Febrile. Leucocytosis. Sputum 6/17 with Staph Epidermidis > Concern for Asp PNA    -PCT 1.04  -CXR with no acute findings  Plan  - Trend WBC and Fever Curve - Follow Culture Data > Negative to Date  - Vancomycin changed to Zyvox on 6/20 due to progressive AKI - New Arm Swelling today will order US Duplex to R/O  DVT  Anemia Plan -Trend CBC -Transfuse for hemoglobin <7   Hypernatremia  Acute on CKD stage 2.  Non gap metabolic acidosis. Hyperchloremia.  Hyperkalemia >> Improving  Hyperphosphatemia >>Improving Plan - Nephrology Following  - Trend BMP >> Repeat at 1600 - Continue free water to 200 q2h - Continue Phos Binder  - Continue low dose Bicarb gtt -- D/C with improvement of ABG and Bicarb  HTN. Plan - Goal SBP < 160 - Continue norvasc, hydralazine  Hypoglycemic Events  DM II Plan - SSI - Levemir 8u BID >>> Decrease to 12 units Daily  - Tube feed coverage >> D/C   Best practice:  Diet: Tube feeds Pain/Anxiety/Delirium protocol (if indicated): na VAP protocol if indicated  DVT prophylaxis: Add Lovenox when OK with NSG GI prophylaxis: Protonix D/C due to AKI. Pepcid added  Glucose control: SSI, Levemir Mobility: Bed rest Code Status: Full code Family Communication: Will updated on plan of care.  Disposition: Continue ICU Care.    Critical care time: [place], AGACNP-BC [facility]  PCCM Pgr: [phone]   PCCM:  69 yo FM, pmh seizures, prior CVA, HTN, Left MCA aneurysm, new left SAH. Arm swelling underwent upper ext duplex.   BP 122/62   Pulse 72   Temp (!) 100.5 F (38.1 C) (Axillary) Comment: tylenol given  Resp 18   Ht 5\' 6"  (1.676 m)   Wt 78 kg   SpO2 100%   BMI 27.75 kg/m   Gen: obese elderly fm HENT: trach in place  Heart: RRR, s1 s2  Lungs: bl vented breaths   Labs reviewed   A: AHRF, s/p trach  Left sided, SAH Asp PNA  Hypernatremia  HTN  Electrolyte abnormalities  UE DVT   P: zyvox  Watch platelets  Follow uop Avoid nephrotoxic agents  Weaning from vent as tolerated  EVD management per NeurSx  Keppra BID  No AC due to [facility]  BL LE duplex, if + can get IVC   This patient is critically ill with multiple organ system failure; which, requires frequent high complexity decision making,  assessment, support, evaluation, and titration of therapies. This was completed through the application of advanced monitoring technologies and extensive interpretation of multiple databases. During this encounter critical care time was devoted to patient care services described in this note for 32 minutes.  Garner Nash, DO [facility] 12/04/2019 3:27 PM

## 2019-12-04 NOTE — Progress Notes (Signed)
TCD vascular progress note. Per protocol, TCDs are stopped after 6th one due to being out of vasospasm window. Please notify if the order for today needs to be done (7th) Cannot access AMION at this time to see neurosurgery provider number for today. Will check back through the day.   Vascular lab [phone]  June Leap, BS, RDMS, RVT

## 2019-12-04 NOTE — Progress Notes (Signed)
Inpatient Diabetes Program Recommendations  AACE/ADA: New Consensus Statement on Inpatient Glycemic Control (2015)  Target Ranges:  Prepandial:   less than 140 mg/dL      Peak postprandial:   less than 180 mg/dL (1-2 hours)      Critically ill patients:  140 - 180 mg/dL   Lab Results  Component Value Date   GLUCAP 198 (H) 12/04/2019   HGBA1C 7.8 (H) 11/21/2019    Review of Glycemic Control Results for SELENI, MELLER (MRN [id number]) as of 12/04/2019 11:59  Ref. Range 12/04/2019 04:11 12/04/2019 04:14 12/04/2019 06:23 12/04/2019 07:54  Glucose-Capillary Latest Ref Range: 70 - 99 mg/dL 50 (L) 46 (L) 92 118 (H)   Diabetes history: Type 2 DM Current orders for Inpatient glycemic control: Novolog 0-9 units Q4H, Levemir 12 units QD Jevity 1.2 @ 45 ml/hr  Inpatient Diabetes Program Recommendations:    Noted hypoglycemic event this AM and subsequent insulin changes. Will follow.   Thanks, Bronson Curb, MSN, RNC-OB Diabetes Coordinator [phone] (8a-5p)

## 2019-12-04 NOTE — Progress Notes (Signed)
eLink Physician-Brief Progress Note Patient Name: Rebecca Green DOB: [birth year] MRN: [id number]   Date of Service  12/04/2019  HPI/Events of Note  Patient had hypoglycemic episode this AM requiring D50. It appears this may have also happened yesterday. There was no significant interruption to her tube feeding to precipitate this. Her insulin regimen is currently: - Glargine 12 u BID - Aspart 10u Q4H (tube feeding coverage) - Aspart sliding scale (resistant scale)   eICU Interventions  Adjusted insulin regimen to: - Glargine 12u BID - Aspart 6u Q4H (tube feeding coverage) - Aspart sliding scale (sensitive)     Intervention Category Intermediate Interventions: Other:  Charlott Rakes 12/04/2019, 6:20 AM

## 2019-12-04 NOTE — Progress Notes (Signed)
  NEUROSURGERY PROGRESS NOTE   Hypoglycemic overnight, no other issues Working with therapy this am   EXAM:  BP (!) 122/57   Pulse (!) 59   Temp 99.6 F (37.6 C) (Axillary)   Resp 18   Ht 5\' 6"  (1.676 m)   Wt 78 kg   SpO2 100%   BMI 27.75 kg/m   Awake Left gaze preference Purposeful LUE Not following commnds EVD patent, clear/yellow CSF Crani site looks good  IMPRESSION/PLAN 69 y.o. female d#15, POD#4s/p clipping ruptured LMCA aneurysm.s/p trach 6/17 now with ARF, MRSE tracheobroncitis on Linezolid. - Cont supportive care, nimotop, keppra, permissive HTN - Appreciate PCCM and nephrology management of patient

## 2019-12-05 ENCOUNTER — Inpatient Hospital Stay (HOSPITAL_COMMUNITY): Payer: Medicare Other

## 2019-12-05 ENCOUNTER — Other Ambulatory Visit (HOSPITAL_COMMUNITY): Payer: Medicare Other

## 2019-12-05 DIAGNOSIS — I6012 Nontraumatic subarachnoid hemorrhage from left middle cerebral artery: Secondary | ICD-10-CM | POA: Diagnosis not present

## 2019-12-05 DIAGNOSIS — I609 Nontraumatic subarachnoid hemorrhage, unspecified: Secondary | ICD-10-CM | POA: Diagnosis not present

## 2019-12-05 LAB — BASIC METABOLIC PANEL
Anion gap: 10 (ref 5–15)
BUN: 77 mg/dL — ABNORMAL HIGH (ref 8–23)
CO2: 19 mmol/L — ABNORMAL LOW (ref 22–32)
Calcium: 9 mg/dL (ref 8.9–10.3)
Chloride: 109 mmol/L (ref 98–111)
Creatinine, Ser: 1.62 mg/dL — ABNORMAL HIGH (ref 0.44–1.00)
GFR calc Af Amer: 37 mL/min — ABNORMAL LOW (ref >60)
GFR calc non Af Amer: 32 mL/min — ABNORMAL LOW (ref >60)
Glucose, Bld: 251 mg/dL — ABNORMAL HIGH (ref 70–99)
Potassium: 4.7 mmol/L (ref 3.5–5.1)
Sodium: 138 mmol/L (ref 135–145)

## 2019-12-05 LAB — POCT I-STAT 7, (LYTES, BLD GAS, ICA,H+H)
Acid-base deficit: 2 mmol/L (ref 0.0–2.0)
Bicarbonate: 21.8 mmol/L (ref 20.0–28.0)
Calcium, Ion: 1.36 mmol/L (ref 1.15–1.40)
HCT: 19 % — ABNORMAL LOW (ref 36.0–46.0)
Hemoglobin: 6.5 g/dL — CL (ref 12.0–15.0)
O2 Saturation: 98 %
Patient temperature: 100.2
Potassium: 4.5 mmol/L (ref 3.5–5.1)
Sodium: 141 mmol/L (ref 135–145)
TCO2: 23 mmol/L (ref 22–32)
pCO2 arterial: 32.3 mmHg (ref 32.0–48.0)
pH, Arterial: 7.442 (ref 7.350–7.450)
pO2, Arterial: 109 mmHg — ABNORMAL HIGH (ref 83.0–108.0)

## 2019-12-05 LAB — GLUCOSE, CAPILLARY
Glucose-Capillary: 100 mg/dL — ABNORMAL HIGH (ref 70–99)
Glucose-Capillary: 137 mg/dL — ABNORMAL HIGH (ref 70–99)
Glucose-Capillary: 147 mg/dL — ABNORMAL HIGH (ref 70–99)
Glucose-Capillary: 152 mg/dL — ABNORMAL HIGH (ref 70–99)
Glucose-Capillary: 181 mg/dL — ABNORMAL HIGH (ref 70–99)
Glucose-Capillary: 188 mg/dL — ABNORMAL HIGH (ref 70–99)
Glucose-Capillary: 190 mg/dL — ABNORMAL HIGH (ref 70–99)

## 2019-12-05 LAB — CBC
HCT: 21.8 % — ABNORMAL LOW (ref 36.0–46.0)
Hemoglobin: 6.8 g/dL — CL (ref 12.0–15.0)
MCH: 30.5 pg (ref 26.0–34.0)
MCHC: 31.2 g/dL (ref 30.0–36.0)
MCV: 97.8 fL (ref 80.0–100.0)
Platelets: 414 10*3/uL — ABNORMAL HIGH (ref 150–400)
RBC: 2.23 MIL/uL — ABNORMAL LOW (ref 3.87–5.11)
RDW: 15.4 % (ref 11.5–15.5)
WBC: 9 10*3/uL (ref 4.0–10.5)
nRBC: 0.4 % — ABNORMAL HIGH (ref 0.0–0.2)

## 2019-12-05 LAB — PHOSPHORUS: Phosphorus: 3.4 mg/dL (ref 2.5–4.6)

## 2019-12-05 LAB — PREPARE RBC (CROSSMATCH)

## 2019-12-05 LAB — MAGNESIUM: Magnesium: 2.4 mg/dL (ref 1.7–2.4)

## 2019-12-05 MED ORDER — FUROSEMIDE 10 MG/ML IJ SOLN
40.0000 mg | Freq: Once | INTRAMUSCULAR | Status: AC
  Administered 2019-12-05: 40 mg via INTRAVENOUS
  Filled 2019-12-05: qty 4

## 2019-12-05 MED ORDER — SODIUM CHLORIDE 0.9% IV SOLUTION: Freq: Once | INTRAVENOUS | Status: DC

## 2019-12-05 NOTE — Progress Notes (Signed)
RT transported patient from 4N27 to CT and back with RN. No complications. RT will continue to monitor.

## 2019-12-05 NOTE — Progress Notes (Addendum)
  NEUROSURGERY PROGRESS NOTE   Anemic with Hgb 6.5 requiring transfusion, also LUE DVT  EXAM:  BP 135/66   Pulse 81   Temp 97.7 F (36.5 C) (Axillary)   Resp 15   Ht 5\' 6"  (1.676 m)   Wt 78 kg   SpO2 100%   BMI 27.75 kg/m   Awake ?tracking Following commands by wiggling toes Not following commands with BUE although certainly purposeful EVD patent, clear/yellow CSF Crani site looks good  IMPRESSION/PLAN 69 y.o. female d#16, POD#15s/p clipping ruptured LMCA aneurysm.s/p trach 6/17 now with ARF 9 improving, MRSE tracheobroncitis on Linezolid. - Cont supportive care, nimotop, keppra, permissive HTN - Appreciate PCCM and nephrology management of patient   I have seen and examined Rebecca Green and agree with the exam, impression, and plan as documented in the note above by Ferne Reus, PA-C.  Rebecca Green SAH d#16 s/p clipping of LMCA aneurysm. Appears to be neurologically stable, intermittently following commands. - Cont Nimotop to finish 21d - Can d/c staples today - AoCKD resolving - VDRF mgmt per PCCM  [name], MD [facility]

## 2019-12-05 NOTE — Progress Notes (Addendum)
NAME:  [place], MRN:  [id number], DOB:  10/03/1950, LOS: 38 ADMISSION DATE:  11/19/2019, CONSULTATION DATE:  11/20/19 REFERRING MD:  Zada Finders, CHIEF COMPLAINT:  SAH    Brief History   69 year old female with PMH significant for seizures, previous CVA's, HTN, and known left MCA aneurysm who reports for sudden unresponsiveness, left-sided facial droop, and right upper extremity weakness. CT Head revealed large volume left sided SAH with 52mm midline shift.   Past Medical History  CVA (No residual deficits) HTN Seizures DM II   Significant Hospital Events   6/06 Admitted to [facility]ICU 6/07 Aneurysm clipped. Acute decompensation - right frontal EVD placed. Intubated for airway protection 6/11 fever 101.3 6/12 change to pressure control 6/14 on PS wean 6/17 Trach Placed   Consults:  Neurology Neurosurgery  Procedures:  6/7 EVD >> 6/7 ETT > 6/17 6/7 Left pterional craniotomy for clipping of MCA aneurysm 6/17 Trach >>   Significant Diagnostic Tests:  6/7 CT Head > Large volume SAH overlying left cerebral hemisphere with  left to right midline shift. New hydrocephalus of lateral and third vents. SAH reflux into occipital horns of lateral vents.  6/6 CTA Neck > Patent common and internal carotids without significant stenosis. Mixed plaque within proximal left ICA. Patent vertebrals.  6/6 CTA Head > 3x2 mm left MCA bifurcation aneurysm (increased in size from April 2020). No intracranial LVO. High grade focal stenosis within inferior division mid M2 left MCA 6/9 renal u/s > Right renal cysts without complicating factors.  No hydronephrosis. 6/19 renal u/s> No hydronephrosis or shadowing stone. 2. Stable right renal upper pole cyst.  Micro Data:  6/6 SARS CoV2 > Negative 6/8 resp >  Normal oral flora 6/17 resp > Few Staph Epidermidis   Antimicrobials:  6/7 Rocephin > 6/10  Interim history/subjective:  Anemic with Hgb 6.5 got 1 unit PRBC, new LUE DVT noted yesterday, BLE  venous doppler NEG   Objective   Blood pressure (!) 135/58, pulse 84, temperature 99.6 F (37.6 C), temperature source Axillary, resp. rate (!) 21, height 5\' 6"  (1.676 m), weight 78 kg, SpO2 100 %.    Vent Mode: CPAP;PSV FiO2 (%):  [40 %] 40 % Set Rate:  [15 bmp] 15 bmp PEEP:  [5 cmH20] 5 cmH20 Pressure Support:  [20 cmH20] 20 cmH20 Plateau Pressure:  [18 cmH20-21 cmH20] 18 cmH20   Intake/Output Summary (Last 24 hours) at 12/05/2019 0959 Last data filed at 12/05/2019 2130 Gross per 24 hour  Intake 2858.97 ml  Output 1919 ml  Net 939.97 ml   Filed Weights   11/28/19 0500 11/29/19 0500 12/02/19 0436  Weight: 79.8 kg 79.6 kg 78 kg    Examination: General - elderly female, lying in bed  HEENT: Trach in place >> clean, dry, intact. EVD in place  Cardiac - RRR, no MRG  Chest - resps even non labored on vent, coarse Abdomen - soft, non tender, active bowel sounds Extremities - 1+ pedal edema, Bilateral upper extremity pitting edema Skin - no rashes Neuro - alert, does not follow commands, pupils intact, withdrawal LUE, RUE 1/5, LLE 3/5, RLE 1/5     Resolved Hospital Problem list     Assessment & Plan:   Acute hypoxic respiratory failure with compromised airway in setting of SAH. 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 -EVD  placed 6/7 -Clipping 6/7 Hx of seizures. Plan  - Per Neurosurgery  - Keppra 500mg  BID - TCDs - Continue EVD per nsgy   Febrile. Leucocytosis. Sputum 6/17 with Staph Epidermidis > Concern for Asp PNA    -PCT 1.04  -CXR with no acute findings  Plan  - Trend WBC and Fever Curve - Follow Culture Data > Negative to Date  - continue zyvox    RUE DVT -  BLE venous doppler neg  PLAN -  Monitor  No anticoagulation  No role IVC filter    Anemia Plan -Trend CBC -Transfuse for hemoglobin <7 - 1 unit PRBC  6/22  Hypernatremia  Acute on CKD3 - improving off vanc  Non gap metabolic acidosis. Hyperchloremia.  Hyperkalemia >> Improving  Hyperphosphatemia >>Improving Plan - Nephrology Following  - Trend BMP >> Repeat at 1600 - Continue free water to 200 q2h - not candidate for dialysis  - Continue Phos Binder   HTN. Plan - Goal SBP < 160 - Continue norvasc, hydralazine  Hypoglycemic Events  DM II Plan - SSI - Levemir 12 units Daily   Best practice:  Diet: Tube feeds Pain/Anxiety/Delirium protocol (if indicated): na VAP protocol if indicated  DVT prophylaxis: Add Lovenox when OK with NSG GI prophylaxis: pepcid  Glucose control: SSI, Levemir Mobility: Bed rest Code Status: Full code Family Communication: Will updated on plan of care.  Disposition: Continue ICU Care.     This patient is critically ill with multiple organ system failure; which, requires frequent high complexity decision making, assessment, support, evaluation, and titration of therapies. This was completed through the application of advanced monitoring technologies and extensive interpretation of multiple databases. During this encounter critical care time was devoted to patient care services described in this note for 32 minutes.  Nickolas Madrid, NP Pulmonary/Critical Care Medicine  12/05/2019  9:59 AM    PCCM:  69 yo FM, left SAH, hydrocephalus s/p EVD sputum with staph epi, abx swapped to linezolid, off vanco. WBC improved, Scr improving. S/p trach   BP 130/61   Pulse 98   Temp (!) 101.2 F (38.4 C) (Axillary)   Resp (!) 26   Ht 5\' 6"  (1.676 m)   Wt 78 kg   SpO2 100%   BMI 27.75 kg/m   Gen: obese, elderly fm, on vent HENT: trach in place  Heart: RRR, s1 s2 Lungs: BL vented breaths   Labs: reviewed, Scr improved   +CFB 5L  A: SAH s/p EVD for hydro AHRF on MV  Staph epi Asp PNA  Positive CFB   P: Diuresis today  Continue nimotop  linezolid with stop date  Keppra BID   This  patient is critically ill with multiple organ system failure; which, requires frequent high complexity decision making, assessment, support, evaluation, and titration of therapies. This was completed through the application of advanced monitoring technologies and extensive interpretation of multiple databases. During this encounter critical care time was devoted to patient care services described in this note for 32 minutes.   Garner Nash, DO [facility] 12/05/2019 2:29 PM

## 2019-12-05 NOTE — Progress Notes (Signed)
eLink Physician-Brief Progress Note Patient Name: Rebecca Green DOB: June 27, 1950 MRN: [id number]   Date of Service  12/05/2019  HPI/Events of Note  Anemia - Hgb = 6.5.   eICU Interventions  Will transfuse 1 unit PRBC now.      Intervention Category Major Interventions: Other:  Lysle Dingwall 12/05/2019, 4:11 AM

## 2019-12-06 DIAGNOSIS — I6012 Nontraumatic subarachnoid hemorrhage from left middle cerebral artery: Secondary | ICD-10-CM | POA: Diagnosis not present

## 2019-12-06 DIAGNOSIS — I609 Nontraumatic subarachnoid hemorrhage, unspecified: Secondary | ICD-10-CM | POA: Diagnosis not present

## 2019-12-06 LAB — CBC
HCT: 25.4 % — ABNORMAL LOW (ref 36.0–46.0)
Hemoglobin: 7.9 g/dL — ABNORMAL LOW (ref 12.0–15.0)
MCH: 30.2 pg (ref 26.0–34.0)
MCHC: 31.1 g/dL (ref 30.0–36.0)
MCV: 96.9 fL (ref 80.0–100.0)
Platelets: 488 10*3/uL — ABNORMAL HIGH (ref 150–400)
RBC: 2.62 MIL/uL — ABNORMAL LOW (ref 3.87–5.11)
RDW: 15.2 % (ref 11.5–15.5)
WBC: 8.9 10*3/uL (ref 4.0–10.5)
nRBC: 0.2 % (ref 0.0–0.2)

## 2019-12-06 LAB — TYPE AND SCREEN
ABO/RH(D): O POS
Antibody Screen: NEGATIVE
Unit division: 0

## 2019-12-06 LAB — BASIC METABOLIC PANEL
Anion gap: 11 (ref 5–15)
BUN: 71 mg/dL — ABNORMAL HIGH (ref 8–23)
CO2: 20 mmol/L — ABNORMAL LOW (ref 22–32)
Calcium: 9.3 mg/dL (ref 8.9–10.3)
Chloride: 112 mmol/L — ABNORMAL HIGH (ref 98–111)
Creatinine, Ser: 1.51 mg/dL — ABNORMAL HIGH (ref 0.44–1.00)
GFR calc Af Amer: 40 mL/min — ABNORMAL LOW (ref >60)
GFR calc non Af Amer: 35 mL/min — ABNORMAL LOW (ref >60)
Glucose, Bld: 185 mg/dL — ABNORMAL HIGH (ref 70–99)
Potassium: 4.4 mmol/L (ref 3.5–5.1)
Sodium: 143 mmol/L (ref 135–145)

## 2019-12-06 LAB — BPAM RBC
Blood Product Expiration Date: 202107152359
ISSUE DATE / TIME: 202106220615
Unit Type and Rh: 5100

## 2019-12-06 LAB — GLUCOSE, CAPILLARY
Glucose-Capillary: 102 mg/dL — ABNORMAL HIGH (ref 70–99)
Glucose-Capillary: 120 mg/dL — ABNORMAL HIGH (ref 70–99)
Glucose-Capillary: 138 mg/dL — ABNORMAL HIGH (ref 70–99)
Glucose-Capillary: 146 mg/dL — ABNORMAL HIGH (ref 70–99)
Glucose-Capillary: 149 mg/dL — ABNORMAL HIGH (ref 70–99)
Glucose-Capillary: 171 mg/dL — ABNORMAL HIGH (ref 70–99)
Glucose-Capillary: 174 mg/dL — ABNORMAL HIGH (ref 70–99)
Glucose-Capillary: 196 mg/dL — ABNORMAL HIGH (ref 70–99)

## 2019-12-06 MED ORDER — IPRATROPIUM-ALBUTEROL 0.5-2.5 (3) MG/3ML IN SOLN: 3.0000 mL | RESPIRATORY_TRACT | Status: DC | PRN

## 2019-12-06 NOTE — Progress Notes (Signed)
  NEUROSURGERY PROGRESS NOTE   No issues overnight.   EXAM:  BP 138/70   Pulse 88   Temp (!) 101.1 F (38.4 C) (Axillary)   Resp (!) 21   Ht 5\' 6"  (1.676 m)   Wt 78 kg   SpO2 100%   BMI 27.75 kg/m   Opens eyes sponataneously Breathing spontaneously on PS Purposeful movements BUE/BLE, L>R not FC Wound c/d/i   IMPRESSION:  69 y.o. female SAH d# 17 s/p clipping LMCA aneurysm. VDRF ?Aspiration PNA Acute on Chronic kidney disease UE DVT  PLAN: - Will clamp EVD today, monitor exam and likely repeat CT tomorrow am - Finish 21d Nimotop - Can d/c TCD, pt 17days from hemorrhage, has not had clinical or sonographic evidence of spasm - Vent mgmt per PCCM

## 2019-12-06 NOTE — Progress Notes (Signed)
EVD clamped per Dr. Kathyrn Sheriff.

## 2019-12-06 NOTE — Progress Notes (Signed)
Attempted to reach provider regarding TCD order. See previous note from 6/21.  No response re: order necessity.  Will d/c order. Please re-order and contact department if needed. [place], BS, RDMS, RVT

## 2019-12-06 NOTE — Progress Notes (Addendum)
NAME:  Rebecca Green, MRN:  [id number], DOB:  [birth year], LOS: 70 ADMISSION DATE:  11/19/2019, CONSULTATION DATE:  11/20/19 REFERRING MD:  Zada Finders, CHIEF COMPLAINT:  SAH    Brief History   69 year old female with PMH significant for seizures, previous CVA's, HTN, and known left MCA aneurysm who reports for sudden unresponsiveness, left-sided facial droop, and right upper extremity weakness. CT Head revealed large volume left sided SAH with 14mm midline shift.   Past Medical History  CVA (No residual deficits) HTN Seizures DM II   Significant Hospital Events   6/06 Admitted to [facility]ICU 6/07 Aneurysm clipped. Acute decompensation - right frontal EVD placed. Intubated for airway protection 6/11 fever 101.3 6/12 change to pressure control 6/14 on PS wean 6/17 Trach Placed   Consults:  Neurology Neurosurgery  Procedures:  6/7 EVD >> 6/7 ETT > 6/17 6/7 Left pterional craniotomy for clipping of MCA aneurysm 6/17 Trach >>   Significant Diagnostic Tests:  6/7 CT Head > Large volume SAH overlying left cerebral hemisphere with  left to right midline shift. New hydrocephalus of lateral and third vents. SAH reflux into occipital horns of lateral vents.  6/6 CTA Neck > Patent common and internal carotids without significant stenosis. Mixed plaque within proximal left ICA. Patent vertebrals.  6/6 CTA Head > 3x2 mm left MCA bifurcation aneurysm (increased in size from April 2020). No intracranial LVO. High grade focal stenosis within inferior division mid M2 left MCA 6/9 renal u/s > Right renal cysts without complicating factors.  No hydronephrosis. 6/19 renal u/s> No hydronephrosis or shadowing stone. 2. Stable right renal upper pole cyst.  Micro Data:  6/6 SARS CoV2 > Negative 6/8 resp >  Normal oral flora 6/17 resp > Few Staph Epidermidis > resistant to erythromycin, oxacillin, and Bactrim 6/19 blood cultures 1>   Antimicrobials:  6/7 Ancef x1 6/7 Rocephin > 6/10 Ancef 6/18 x  1 6/18 vancomycin 6/20 6/20 Zyvox 6/22  Interim history/subjective:  RN reports no acute events overnight, lying in bed on mechanical ventilator through tracheostomy  Objective   Blood pressure 124/62, pulse 78, temperature 100.3 F (37.9 C), temperature source Axillary, resp. rate 19, height 5\' 6"  (1.676 m), weight 78 kg, SpO2 100 %.    Vent Mode: PSV;CPAP FiO2 (%):  [40 %] 40 % Set Rate:  [15 bmp] 15 bmp PEEP:  [5 cmH20] 5 cmH20 Pressure Support:  [12 cmH20] 12 cmH20 Plateau Pressure:  [9 cmH20-19 cmH20] 18 cmH20   Intake/Output Summary (Last 24 hours) at 12/06/2019 0854 Last data filed at 12/06/2019 0800 Gross per 24 hour  Intake 2250 ml  Output 2459 ml  Net -209 ml   Filed Weights   11/28/19 0500 11/29/19 0500 12/02/19 0436  Weight: 79.8 kg 79.6 kg 78 kg    Examination: General: Chronically ill appearing elderly deconditioned elderly female lying in bed in no acute distress  HEENT: 6 cuffed trach midline, no secretions ETT, MM pink/moist, PERRL, sclera nonicteric EVD in place Neuro: Will open eyes to verbal stimuli but unable to follow any commands, spontaneous movement of bilateral upper extremities observed CV: s1s2 regular rate and rhythm, no murmur, rubs, or gallops,  PULM: Faint rhonchi bilaterally, no increased work of breathing, tolerating SBT well currently GI: soft, bowel sounds active in all 4 quadrants, non-tender, non-distended, tolerating TF Extremities: warm/dry, no edema  Skin: no rashes or lesions  Resolved Hospital Problem list   Hyponatremia Hyperchloremia Hyperkalemia Hyperphosphatemia  Assessment & Plan:  Acute hypoxic respiratory failure with compromised airway in setting of [place].  -S/P Trach 6/17  Plan: Currently tolerating SBT well, mentation precludes ATC trial currently Continue ventilator support with lung protective strategies  Wean PEEP and FiO2 for sats greater than 90%. Head of bed elevated 30 degrees. Plateau pressures less than  30 cm H20.  Follow intermittent chest x-ray and ABG.   Ensure adequate pulmonary hygiene  Follow cultures  VAP bundle in place  PAD protocol  Acute left-sided SAH with hydrocephalus,  Likely ruptured left MCA aneurysm. -EVD placed 6/7 -Clipping 6/7 Hx of seizures. Plan: Management per neurology  Maintain neuro protective measures; eurothermia, euglycemia, eunatermia, normoxia Nutrition and bowel regiment  Seizure precautions  AEDs per neurology  Repeat TCD's per neurology EVD management per neurosurgery Permissive hypertension  Concern for Asp PNA -Sputum 6/17 with Staph Epidermidis     -PCT 1.04 > 1.44 -CXR with no acute findings  Plan  Status post 5 days of vancomycin and Zyvox Continue to trend WBC and fever curve Repeat blood culture 6/19 remains negative to date  RUE DVT  -Bilateral upper extremity Doppler positive for left axillary deep vein thrombosis , bilateral lower extremity Dopplers negative  Plan: Continue to monitor No anticoagulation in the setting of recent [place] with continued anemia and EVD in place No role for IVC filter  Anemia -S/p 1 unit of PRBC 6/22 Plan: Trend CBC Transfuse per protocol Hemoglobin goal greater than 7  Acute on CKD3, improving off vanc  -Baseline creatinine 1.25, creatinine peaked at 2.13 on 4/23 Non gap metabolic acidosis, improving Plan: Nephrology consulted during admission, signed off 6/21 Follow renal function / urine output Trend Bmet Avoid nephrotoxins, ensure adequate renal perfusion  Continue enteral hydration Patient not a candidate for dialysis Continue phosphate binder  HTN. Plan Goal SBP less than 160 Avoid hypotension Continue Norvasc and hydralazine  Hypoglycemic Events  DM II Plan: Continue sliding scale insulin Continue long-acting Levemir CBG checks every 4 hours   Best practice:  Diet: Tube feeds Pain/Anxiety/Delirium protocol (if indicated): na VAP protocol if indicated  DVT  prophylaxis: Add Lovenox when OK with NSG GI prophylaxis: pepcid  Glucose control: SSI, Levemir Mobility: Bed rest Code Status: Full code Family Communication: Will updated on plan of care.  Disposition: Continue ICU Care.    Labs   CBC: Recent Labs  Lab 12/01/19 0607 12/01/19 0607 12/02/19 0002 12/02/19 1128 12/03/19 0329 12/04/19 0445 12/04/19 0857 12/05/19 0300 12/05/19 0510  WBC 13.5*  --  15.7*  --  14.1* 9.6  --   --  9.0  HGB 7.7*   < > 8.1*   < > 8.3* 7.4* 7.5* 6.5* 6.8*  HCT 25.1*   < > 26.9*   < > 28.0* 23.9* 22.0* 19.0* 21.8*  MCV 98.0  --  100.4*  --  101.8* 99.2  --   --  97.8  PLT 480*  --  491*  --  572* 524*  --   --  414*   < > = values in this interval not displayed.    Basic Metabolic Panel: Recent Labs  Lab 12/01/19 0607 12/01/19 0607 12/02/19 0002 12/02/19 1128 12/03/19 0329 12/03/19 0329 12/03/19 1548 12/03/19 1548 12/04/19 0445 12/04/19 0857 12/04/19 1809 12/05/19 0300 12/05/19 0510  NA 150*   < > 152*   < > 150*   < > 149*   < > 146* 144 141 141 138  K 4.7   < > 5.0   < >  5.4*   < > 5.1   < > 4.5 4.8 4.6 4.5 4.7  CL 126*   < > 128*   < > 121*  --  120*  --  117*  --  113*  --  109  CO2 15*   < > 13*   < > 18*  --  19*  --  19*  --  18*  --  19*  GLUCOSE 172*   < > 130*   < > 74  --  114*  --  71  --  197*  --  251*  BUN 69*   < > 69*   < > 76*  --  85*  --  85*  --  84*  --  77*  CREATININE 1.68*   < > 1.71*   < > 1.94*  --  2.13*  --  1.85*  --  1.74*  --  1.62*  CALCIUM 9.5   < > 9.6   < > 9.6  --  9.7  --  9.4  --  9.0  --  9.0  MG 2.7*  --  2.6*  --  2.7*  --   --   --  2.6*  --   --   --  2.4  PHOS 5.1*  --  4.5  --  7.2*  --   --   --  5.8*  --   --   --  3.4   < > = values in this interval not displayed.   GFR: Estimated Creatinine Clearance: 34.1 mL/min (A) (by C-G formula based on SCr of 1.62 mg/dL (H)). Recent Labs  Lab 12/02/19 0002 12/03/19 0329 12/04/19 0445 12/05/19 0510  PROCALCITON 1.04 1.44  --   --   WBC  15.7* 14.1* 9.6 9.0    Liver Function Tests: Recent Labs  Lab 11/29/19 1227 11/30/19 0656 12/01/19 0607 12/02/19 0002  AST 22 19 18 21   ALT 34 30 32 30  ALKPHOS 117 126 123 123  BILITOT 0.4 0.2* 0.4 0.5  PROT 6.1* 6.1* 6.2* 6.3*  ALBUMIN 2.2* 2.3* 2.3* 2.4*   No results for input(s): LIPASE, AMYLASE in the last 168 hours. No results for input(s): AMMONIA in the last 168 hours.  ABG    Component Value Date/Time   PHART 7.442 12/05/2019 0300   PCO2ART 32.3 12/05/2019 0300   PO2ART 109 (H) 12/05/2019 0300   HCO3 21.8 12/05/2019 0300   TCO2 23 12/05/2019 0300   ACIDBASEDEF 2.0 12/05/2019 0300   O2SAT 98.0 12/05/2019 0300     Coagulation Profile: Recent Labs  Lab 11/29/19 2136 11/30/19 0656  INR 1.1 1.1    Cardiac Enzymes: No results for input(s): CKTOTAL, CKMB, CKMBINDEX, TROPONINI in the last 168 hours.  HbA1C: Hgb A1c MFr Bld  Date/Time Value Ref Range Status  11/21/2019 10:55 AM 7.8 (H) 4.8 - 5.6 % Final    Comment:    (NOTE) Pre diabetes:          5.7%-6.4% Diabetes:              >6.4% Glycemic control for   <7.0% adults with diabetes     CBG: Recent Labs  Lab 12/05/19 1545 12/05/19 1949 12/05/19 2335 12/06/19 0335 12/06/19 0800  GLUCAP 147* 152* 100* 196* 149*       Critical care time:    Performed by: Johnsie Cancel  Total critical care time: 34 minutes  Critical care time was exclusive of separately billable  procedures and treating other patients.  Critical care was necessary to treat or prevent imminent or life-threatening deterioration.  Critical care was time spent personally by me on the following activities: development of treatment plan with patient and/or surrogate as well as nursing, discussions with consultants, evaluation of patient's response to treatment, examination of patient, obtaining history from patient or surrogate, ordering and performing treatments and interventions, ordering and review of laboratory studies,  ordering and review of radiographic studies, pulse oximetry and re-evaluation of patient's condition.  Johnsie Cancel, NP-C [facility] Contact / Pager information can be found on Amion  12/06/2019, 9:23 AM    PCCM:  69 yo s/p SAH, with 7mm midline shift, hydro s/p evd. Chronic resp failure now trached.   BP 91/75   Pulse 75   Temp (!) 101.1 F (38.4 C) (Axillary)   Resp 19   Ht 5\' 6"  (1.676 m)   Wt 78 kg   SpO2 100%   BMI 27.75 kg/m   Gen: chronically debilitated  HENT: tracking, EVD in place  Heart: RRR, s1 s2 Lungs: CTAB no wheeze   Labs reviewed   A: Chronic hypoxemic respiratory failure  S/p Trach  Acute left SAH with hydro s/p EVD  RUE DVT, unable to start AC 2/2 above   P: Weaning vent as tolerated  Adult vent protocol  TCD d/c today by neurosx  Needs PEG  nimotop for 21d   This patient is critically ill with multiple organ system failure; which, requires frequent high complexity decision making, assessment, support, evaluation, and titration of therapies. This was completed through the application of advanced monitoring technologies and extensive interpretation of multiple databases. During this encounter critical care time was devoted to patient care services described in this note for 32 minutes.  [facility] 12/06/2019 3:08 PM

## 2019-12-06 NOTE — Consult Note (Signed)
Chief Complaint: Patient was seen in consultation today for dysphasia/gastrostomy tube placement.  Referring Physician(s): Marijean Heath  Supervising Physician: Corrie Mckusick  Patient Status: [facility] - In-pt  History of Present Illness: Ali A Wiltgen is a 69 y.o. female with a past medical history of hypertension, epilepsy, and CVA. She presented to [facility] ED 11/19/2019 due to AMS. 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 She was admitted for further management. She underwent a left pterional craniotomy with clipping of MCA aneurysm and right frontal EVD placement in OR 11/20/2019 by Dr. Kathyrn Sheriff. Following procedure, she required long-term respiratory support, and was taken to OR 11/30/2019 for tracheostomy placement by Dr. Valeta Harms. She currently has an NG tube in place, but is requiring additional nutritional support at this time.  IR requested by Darlina Sicilian, NP for possible image-guided percutaneous gastrostomy tube placement. Patient laying in bed resting comfortably, tracheostomy in place, no sedation. She does not open eyes to voice and does not follow simple commands- history difficult to obtain secondary to this.   Past Medical History:  Diagnosis Date  . CVA (cerebral vascular accident) ([place])   . HTN (hypertension)   . Seizure [facility] ([place]     Past Surgical History:  Procedure Laterality Date  . CRANIOTOMY Left 11/20/2019   Procedure: LEFT CRANIOTOMY INTRACRANIAL ANEURYSM CLIPPING;  Surgeon: [name], MD;  Location: [place];  Service: Neurosurgery;  Laterality: Left;  . IR 3D INDEPENDENT WKST  11/20/2019  . IR ANGIO INTRA EXTRACRAN SEL INTERNAL CAROTID BILAT MOD SED  11/20/2019  . IR ANGIO VERTEBRAL SEL VERTEBRAL UNI L MOD SED  11/20/2019  . IR US GUIDE VASC ACCESS RIGHT  11/20/2019  . RADIOLOGY WITH ANESTHESIA N/A 11/20/2019   Procedure: IR WITH ANESTHESIA;  Surgeon: [name], MD;  Location: [place];  Service:  Radiology;  Laterality: N/A;    Allergies: Penicillins  Medications: Prior to Admission medications   Medication Sig Start Date End Date Taking? Authorizing Provider  amLODipine (NORVASC) 10 MG tablet Take 10 mg by mouth daily. 09/25/19  Yes [provider]  aspirin EC 81 MG tablet Take 81 mg by mouth daily.   Yes [provider]  hydrALAZINE (APRESOLINE) 50 MG tablet Take 50 mg by mouth 3 (three) times daily. 10/30/19  Yes [provider]  HYDROcodone-acetaminophen (NORCO/VICODIN) 5-325 MG tablet Take 1 tablet by mouth 3 (three) times daily. 11/02/19  Yes [provider]  levETIRAcetam (KEPPRA) 500 MG tablet Take 500 mg by mouth 2 (two) times daily. For seizure 10/29/19  Yes [provider]  linaclotide (LINZESS) 145 MCG CAPS capsule Take 145 mcg by mouth daily before breakfast.   Yes [provider]  loratadine (CLARITIN) 10 MG tablet Take 10 mg by mouth daily. For sinus congestion 11/01/19  Yes [provider]  meloxicam (MOBIC) 7.5 MG tablet Take 7.5 mg by mouth daily. Take with food - for pain 10/29/19  Yes [provider]  omeprazole (PRILOSEC) 20 MG capsule Take 20 mg by mouth daily. 10/30/19  Yes [provider]     History reviewed. No pertinent family history.  Social History   Socioeconomic History  . Marital status: Single    Spouse name: Not on file  . Number of children: Not on file  . Years of education: Not on file  . Highest education level: Not on file  Occupational History  . Not on file  Tobacco Use  . Smoking status:  Not on file  Substance and Sexual Activity  . Alcohol use: Not on file  . Drug use: Not on file  . Sexual activity: Not on file  Other Topics Concern  . Not on file  Social History Narrative  . Not on file   Social Determinants of Health   Financial Resource Strain:   . Difficulty of Paying Living Expenses:   Food Insecurity:   . Worried About Charity fundraiser  in the Last Year:   . Arboriculturist in the Last Year:   Transportation Needs:   . Film/video editor (Medical):   Marland Kitchen Lack of Transportation (Non-Medical):   Physical Activity:   . Days of Exercise per Week:   . Minutes of Exercise per Session:   Stress:   . Feeling of Stress :   Social Connections:   . Frequency of Communication with Friends and Family:   . Frequency of Social Gatherings with Friends and Family:   . Attends Religious Services:   . Active Member of Clubs or Organizations:   . Attends Archivist Meetings:   Marland Kitchen Marital Status:      Review of Systems: A 12 point ROS discussed and pertinent positives are indicated in the HPI above.  All other systems are negative.  Review of Systems  Unable to perform ROS: Intubated    Vital Signs: BP 138/70   Pulse 88   Temp (!) 101.1 F (38.4 C) (Axillary)   Resp (!) 21   Ht 5\' 6"  (1.676 m)   Wt 171 lb 15.3 oz (78 kg)   SpO2 100%   BMI 27.75 kg/m   Physical Exam Vitals and nursing note reviewed.  Constitutional:      General: She is not in acute distress.    Comments: Tracheostomy.  HENT:     Head:     Comments: Right frontal EVD. Cardiovascular:     Rate and Rhythm: Normal rate and regular rhythm.     Heart sounds: Normal heart sounds. No murmur heard.   Pulmonary:     Effort: Pulmonary effort is normal. No respiratory distress.     Breath sounds: Normal breath sounds. No wheezing.     Comments: Tracheostomy. Skin:    General: Skin is warm and dry.      MD Evaluation Airway: Other (comments) Airway comments: Tracheostomy Heart: WNL Abdomen: WNL Chest/ Lungs: WNL ASA  Classification: 3 Mallampati/Airway Score: Three (Tracheostomy)   Imaging: CT ABDOMEN WO CONTRAST  Result Date: 12/06/2019 CLINICAL DATA:  Evaluate anatomy prior to potential gastrostomy tube placement. EXAM: CT ABDOMEN WITHOUT CONTRAST TECHNIQUE: Multidetector CT imaging of the abdomen was performed following the  standard protocol without IV contrast. COMPARISON:  CT abdomen pelvis-11/11/2017; 05/16/2014 FINDINGS: The lack of intravenous contrast limits the ability to evaluate solid abdominal organs. Lower chest: Limited visualization of the lower thorax demonstrates consolidative opacities involving medial aspect of the right lower lobe with associated air bronchograms. Minimal dependent ground-glass atelectasis is seen within the left lower lobe. No pleural effusion. Cardiomegaly.  No pericardial effusion. Hepatobiliary: Normal hepatic contour. Normal noncontrast appearance of the gallbladder given degree distention. No radiopaque gallstones. No ascites. Pancreas: Normal noncontrast appearance of the pancreas. Spleen: Normal noncontrast appearance of the spleen. Adrenals/Urinary Tract: Hypoattenuating renal lesions are seen bilaterally with dominant right-sided hypoattenuating lesion measuring 1.9 cm in greatest short axis diameter (image 21, series 3) and dominant left-sided hypoattenuating lesion measuring 2.0 cm (coronal image 61, series 6)., incompletely evaluated  on this noncontrast examination though likely representative of renal cysts. Note is also made of a punctate (approximately 0.6 cm) hyperattenuating lesion involving the posterior aspect of the left kidney (coronal image 62, series 6 which is too small to accurately characterize though favored to represent a hyperdense/hemorrhagic renal cyst. No renal stones. No urinary obstruction or perinephric stranding. 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 The urinary bladder was not imaged. Stomach/Bowel: The mid body of the stomach is well apposed against the ventral lateral aspect of the left upper abdomen (image 28, series  3), without interposition of the hepatic parenchyma or transverse colon. Enteric tube tip terminates within the gastric antrum. Large colonic stool burden without evidence of enteric obstruction. No pneumoperitoneum, pneumatosis or portal venous gas. Vascular/Lymphatic: Atherosclerotic plaque within a normal caliber abdominal aorta No bulky retroperitoneal, mesenteric or porta hepatis lymphadenopathy on this noncontrast examination. Other: Mild diffuse body wall anasarca. Musculoskeletal: No definite acute or aggressive osseous abnormalities. Severe DDD of T10-T11 with disc space height loss, endplate irregularity and sclerosis. Similar severe degenerative change is suspected at L5-S1, incompletely evaluated. Grade 1 anterolisthesis of L4 upon L5 without associated pars defects. IMPRESSION: 1. Gastric anatomy amenable to potential percutaneous gastrostomy tube placement as indicated. 2. Indeterminate approximately 1.9 cm left adrenal gland nodule, incompletely evaluated on this noncontrast examination, though morphologically similar to the 2019 examination and favored to represent a hyperdense adrenal adenoma. 3. Right lower lobe consolidative airspace opacities with associated air bronchograms, atelectasis versus infiltrate. 4. Aortic Atherosclerosis (ICD10-I70.0). Electronically Signed   By: Sandi Mariscal M.D.   On: 12/06/2019 08:59   CT Code Stroke CTA Head W/WO contrast  Addendum Date: 11/19/2019   ADDENDUM REPORT: 11/19/2019 15:48 ADDENDUM: These results were communicated to Dr. Cheral Marker At 3:48 pmon 6/6/2021by text page via the [facility] messaging system. Electronically Signed   By: Kellie Simmering DO   On: 11/19/2019 15:48   Result Date: 11/19/2019 CLINICAL DATA:  Neuro deficit, acute, stroke suspected. EXAM: CT ANGIOGRAPHY HEAD AND NECK TECHNIQUE: Multidetector CT imaging of the head and neck was performed using the standard protocol during bolus administration of intravenous contrast. Multiplanar CT image  reconstructions and MIPs were obtained to evaluate the vascular anatomy. Carotid stenosis measurements (when applicable) are obtained utilizing NASCET criteria, using the distal internal carotid diameter as the denominator. CONTRAST:  Administered contrast not known at this time. COMPARISON:  Report from MRA head 10/13/2018 (images unavailable). FINDINGS: CTA NECK FINDINGS Aortic arch: Standard aortic branching. Atherosclerotic plaque within the visualized aortic arch and proximal major branch vessels of the neck. No hemodynamically significant innominate or proximal subclavian artery stenosis. Right carotid system: CCA and ICA patent within the neck without significant stenosis (50% or greater). Mild mixed plaque within the carotid bifurcation. Left carotid system: CCA and ICA patent within the neck without significant stenosis (50% or greater). Moderate mixed plaque within the proximal ICA. Vertebral arteries: The vertebral arteries are patent within the neck without significant stenosis. Skeleton: No acute bony abnormality or aggressive osseous lesion. Cervical spondylosis without high-grade bony spinal canal narrowing. Other neck: Enlarged multinodular thyroid gland with extension into the upper mediastinum on the right. There also multifocal parenchymal calcifications. Nodules measure up to 4.4 cm. Upper chest: No consolidation within the imaged lung apices Review of  the MIP images confirms the above findings CTA HEAD FINDINGS Anterior circulation: The intracranial internal carotid arteries are patent. Calcified plaque results in mild narrowing of the cavernous right ICA. The M1 middle cerebral arteries are patent without significant stenosis. No M2 proximal branch occlusion is identified. High-grade focal stenosis within an inferior division mid M2 left MCA branch vessel (series 15, image 30). This may be atherosclerotic in etiology or may reflect basal spasm. There is a 3 x 2 mm aneurysm arising from the left  MCA bifurcation (series 12, image 104). By report, this has increased in size since MRA 10/13/2018 (previously 2 x 2 mm). No other intracranial aneurysm is identified. The anterior cerebral arteries are patent without significant proximal stenosis. Posterior circulation: The intracranial vertebral arteries are patent without significant stenosis, as is the basilar artery. The bilateral posterior cerebral arteries are patent without significant proximal stenosis. Venous sinuses: Within limitations of contrast timing, no convincing thrombus. Anatomic variants: Posterior communicating arteries are hypoplastic or absent bilaterally. Review of the MIP images confirms the above findings IMPRESSION: CTA neck: 1. The bilateral common and internal carotid arteries are patent within the neck without significant stenosis (50% or greater). Mild mixed plaque within the right carotid bifurcation. Moderate mixed plaque within the proximal left ICA. 2. The vertebral arteries are patent within the neck without significant stenosis. 3. Enlarged multinodular thyroid gland with multiple nodules measuring up to 4.4 cm. Nonemergent thyroid ultrasound is recommended for further evaluation. CTA head: 1. 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 By report, this has increased in size since prior MRA head 10/13/2018 (measured at 2 x 2 mm at that time). This is the likely culprit for the large volume acute subarachnoid hemorrhage. 2. No intracranial large vessel occlusion. 3. High-grade focal stenosis within an inferior division mid M2 left MCA branch vessel. This may reflect atherosclerotic disease or vasospasm. 4. Mild atherosclerotic narrowing of the cavernous right ICA. Electronically Signed: By: Kellie Simmering DO On: 11/19/2019 15:37   CT HEAD WO CONTRAST  Result Date: 11/20/2019 CLINICAL DATA:  Mental status change EXAM: CT HEAD WITHOUT CONTRAST TECHNIQUE: Contiguous axial images were obtained from the base of the skull through the vertex  without intravenous contrast. COMPARISON:  Yesterday FINDINGS: Brain: New hydrocephalus at the lateral and third ventricles. There has been intraventricular reflux of hemorrhage into the occipital horns and cerebral aqua duct. Degree of subarachnoid hemorrhage along the left sylvian and perisylvian sulci and basal cisterns is unchanged. Remote right occipital parietal infarct. Remote lacunar/perforator infarcts at the basal ganglia. Vascular: Negative Skull: Negative Sinuses/Orbits: No acute finding Other: Findings called to the floor at 5:33 a.m. and the finding of new hydrocephalus was relayed to the patient's nurse who was discussing the case the phone with neurosurgery. IMPRESSION: 1. 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 2. Subarachnoid hemorrhage has refluxed into the occipital horns of the lateral ventricles and into the cerebral aqua duct. 3. Degree of extraventricular hemorrhage is similar to prior, no suspected rebleeding. Electronically Signed   By: Monte Fantasia M.D.   On: 11/20/2019 05:36   CT Code Stroke CTA Neck W/WO contrast  Addendum Date: 11/19/2019   ADDENDUM REPORT: 11/19/2019 15:48 ADDENDUM: These results were communicated to Dr. Cheral Marker At 3:48 pmon 6/6/2021by text page via the [facility] messaging system. Electronically Signed   By: Kellie Simmering DO   On: 11/19/2019 15:48   Result Date: 11/19/2019 CLINICAL DATA:  Neuro deficit, acute, stroke suspected. EXAM: CT ANGIOGRAPHY HEAD  AND NECK TECHNIQUE: Multidetector CT imaging of the head and neck was performed using the standard protocol during bolus administration of intravenous contrast. Multiplanar CT image reconstructions and MIPs were obtained to evaluate the vascular anatomy. Carotid stenosis measurements (when applicable) are obtained utilizing NASCET criteria, using the distal internal carotid diameter as the denominator. CONTRAST:  Administered contrast not known at this time. COMPARISON:  Report from MRA head 10/13/2018  (images unavailable). FINDINGS: CTA NECK FINDINGS Aortic arch: Standard aortic branching. Atherosclerotic plaque within the visualized aortic arch and proximal major branch vessels of the neck. No hemodynamically significant innominate or proximal subclavian artery stenosis. Right carotid system: CCA and ICA patent within the neck without significant stenosis (50% or greater). Mild mixed plaque within the carotid bifurcation. Left carotid system: CCA and ICA patent within the neck without significant stenosis (50% or greater). Moderate mixed plaque within the proximal ICA. Vertebral arteries: The vertebral arteries are patent within the neck without significant stenosis. Skeleton: No acute bony abnormality or aggressive osseous lesion. Cervical spondylosis without high-grade bony spinal canal narrowing. Other neck: Enlarged multinodular thyroid gland with extension into the upper mediastinum on the right. There also multifocal parenchymal calcifications. Nodules measure up to 4.4 cm. Upper chest: No consolidation within the imaged lung apices Review of the MIP images confirms the above findings CTA HEAD FINDINGS Anterior circulation: The intracranial internal carotid arteries are patent. Calcified plaque results in mild narrowing of the cavernous right ICA. The M1 middle cerebral arteries are patent without significant stenosis. No M2 proximal branch occlusion is identified. High-grade focal stenosis within an inferior division mid M2 left MCA branch vessel (series 15, image 30). This may be atherosclerotic in etiology or may reflect basal spasm. There is a 3 x 2 mm aneurysm arising from the left MCA bifurcation (series 12, image 104). By report, this has increased in size since MRA 10/13/2018 (previously 2 x 2 mm). No other intracranial aneurysm is identified. The anterior cerebral arteries are patent without significant proximal stenosis. Posterior circulation: The intracranial vertebral arteries are patent  without significant stenosis, as is the basilar artery. The bilateral posterior cerebral arteries are patent without significant proximal stenosis. Venous sinuses: Within limitations of contrast timing, no convincing thrombus. Anatomic variants: Posterior communicating arteries are hypoplastic or absent bilaterally. Review of the MIP images confirms the above findings IMPRESSION: CTA neck: 1. The bilateral common and internal carotid arteries are patent within the neck without significant stenosis (50% or greater). Mild mixed plaque within the right carotid bifurcation. Moderate mixed plaque within the proximal left ICA. 2. The vertebral arteries are patent within the neck without significant stenosis. 3. Enlarged multinodular thyroid gland with multiple nodules measuring up to 4.4 cm. Nonemergent thyroid ultrasound is recommended for further evaluation. CTA head: 1. 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 By report, this has increased in size since prior MRA head 10/13/2018 (measured at 2 x 2 mm at that time). This is the likely culprit for the large volume acute subarachnoid hemorrhage. 2. No intracranial large vessel occlusion. 3. High-grade focal stenosis within an inferior division mid M2 left MCA branch vessel. This may reflect atherosclerotic disease or vasospasm. 4. Mild atherosclerotic narrowing of the cavernous right ICA. Electronically Signed: By: Kellie Simmering DO On: 11/19/2019 15:37   US RENAL  Result Date: 12/02/2019 CLINICAL DATA:  69 year old female with acute renal insufficiency. EXAM: RENAL / URINARY TRACT ULTRASOUND COMPLETE COMPARISON:  Renal ultrasound dated 11/22/2019. FINDINGS: Right Kidney: Renal measurements: 10.6 x 4.7  x 4.4 cm = volume: 115 mL. Normal echogenicity. No hydronephrosis or shadowing stone. There is a 2 cm upper pole cyst as seen previously. Left Kidney: Renal measurements: 9.8 x 5.7 x 5.9 cm = volume: 172 mL. Normal echogenicity. No hydronephrosis or shadowing stone. The  left kidney suboptimally visualized due to overlying bowel gas. Bladder: Not visualized. Other: None. IMPRESSION: 1. No hydronephrosis or shadowing stone. 2. Stable right renal upper pole cyst. Electronically Signed   By: Anner Crete M.D.   On: 12/02/2019 16:03   US RENAL  Result Date: 11/22/2019 CLINICAL DATA:  Acute renal injury EXAM: RENAL / URINARY TRACT ULTRASOUND COMPLETE COMPARISON:  None. FINDINGS: Right Kidney: Renal measurements: 9.6 x 5.4 x 5.1 cm. = volume: 139 mL. Two cysts are seen. The largest of these measures 2 cm in greatest dimension. No mass lesion or hydronephrosis is seen. Left Kidney: Renal measurements: 9.6 x 5.2 x 5.3 cm. = volume: 139 mL. Echogenicity within normal limits. No mass or hydronephrosis visualized. Bladder: Decompressed by Foley catheter. Other: None. IMPRESSION: Right renal cysts without complicating factors. No hydronephrosis. Electronically Signed   By: Inez Catalina M.D.   On: 11/22/2019 11:30   IR 3D Independent Darreld Mclean  Result Date: 11/20/2019 PROCEDURE: DIAGNOSTIC CEREBRAL ANGIOGRAM  HISTORY: The patient is a 69 year old woman presenting to the hospital after being found unresponsive. 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 Patient progressively declined overnight requiring external ventricular drain placement and intubation for airway protection. She presents for diagnostic cerebral angiogram for further workup with possible aneurysm coiling.  ACCESS: The technical aspects of the procedure as well as its potential risks and benefits were reviewed with the patient's daughter. These risks included but were not limited bleeding, infection, allergic reaction, damage to organs or vital structures, stroke, non-diagnostic procedure, and the catastrophic outcomes of heart attack, coma, and death. With an understanding of these risks, informed  consent was obtained and witnessed. The patient was placed in the supine position on the angiography table and the skin of right groin prepped in the usual sterile fashion.  The procedure was performed under general anesthesia administered by the anesthesia service.  A 5- French sheath was introduced in the right common femoral artery under ultrasound guidance using Seldinger technique. Ultrasound allowed direct visualization of micro puncture needle entry into the lumen of the right common femoral artery. A fluoro-phase sequence was used to document the sheath position.  MEDICATIONS: HEPARIN: 0 Units total.  CONTRAST:  cc, Omnipaque 300  FLUOROSCOPY TIME:  FLUOROSCOPY TIME: See IR records  TECHNIQUE: CATHETERS AND WIRES 5-French JB-1 catheter  0.035" glidewire  VESSELS CATHETERIZED Right internal carotid  Left internal carotid  Left vertebral  Right common femoral  VESSELS STUDIED Right internal carotid, head  Left internal carotid, head  Left internal carotid, three-dimensional rotational angiogram  Left vertebral  Right femoral  PROCEDURAL NARRATIVE A 5-Fr JB-1 catheter was advanced over a 0.035 glidewire into the aortic arch. The above vessels were then sequentially catheterized and cervical / cerebral angiograms taken. After review of images, the catheter was removed without incident.  FINDINGS: Right internal carotid, head:  Injection reveals the presence of a widely patent ICA, M1, and A1 segments and their branches. No aneurysms, arteriovenous malformations, or fistulas are identified. There is no significant vasospasm. The parenchymal and venous phases are normal. The venous sinuses are widely patent.  Left internal carotid, head:  Injection reveals the presence of a widely patent ICA, A1, and M1 segments and their branches. There is an aneurysm arising from the left middle cerebral artery bifurcation. Aneurysm measures approximately 3.5 mm tall, and is approximately 2.5 mm wide.  Aneurysm has a relatively wide neck measuring approximately 2.5 mm incorporating the origin of both divisions of the middle cerebral artery. The parenchymal and venous phases are normal. The venous sinuses are widely patent.  Left internal carotid, 3D rotation  3-dimensional rotational angiographic images were reconstructed on [facility] workstation for review. These further delineate the above described left middle cerebral artery bifurcation aneurysm. Aneurysm projects laterally, and again appears to incorporate the origin of both middle cerebral artery branches at the neck.  Left vertebral:  Injection reveals the presence of a widely patent vertebral artery. This leads to a widely patent basilar artery that terminates in bilateral P1. The basilar apex is normal. No aneurysms are identified. There is significant contrast washout due to co-dominance of the contralateral vertebral artery. The parenchymal and venous phases are normal. The venous sinuses are widely patent.  Right femoral:  Normal vessel. No significant atherosclerotic disease. Arterial sheath in adequate position.  DISPOSITION: Upon completion of the study, the femoral sheath was removed and hemostasis obtained using a 5-Fr ExoSeal closure device. Good proximal and distal lower extremity pulses were documented upon achievement of hemostasis. The patient remained hemodynamically stable throughout the procedure.  The patient was kept under anesthesia and transferred to the operating room for surgical clipping.  IMPRESSION: 1. 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 Aneurysm is not readily amenable to coil embolization due to wide neck nature and incorporation of the middle cerebral artery branches at the neck.   Electronically Signed   By: [name]   On: 11/20/2019 12:01  IR US Guide Vasc Access Right  Result Date: 11/20/2019 PROCEDURE: DIAGNOSTIC CEREBRAL ANGIOGRAM  HISTORY: The patient is a 69 year old woman presenting to the hospital after being found unresponsive. 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 Patient progressively declined overnight requiring external ventricular drain placement and intubation for airway protection. She presents for diagnostic cerebral angiogram for further workup with possible aneurysm coiling. ACCESS: The technical aspects of the procedure as well as its potential risks and benefits were reviewed with the patient's daughter. These risks included but were not limited bleeding, infection, allergic reaction, damage to organs or vital structures, stroke, non-diagnostic procedure, and the catastrophic outcomes of heart attack, coma, and death. With an understanding of these risks, informed consent was obtained and witnessed. The patient was placed in the supine position on the angiography table and the skin of right groin prepped in the usual sterile fashion. The procedure was performed under general anesthesia administered by the anesthesia service. A 5- French sheath was introduced in the right common femoral artery under ultrasound guidance using Seldinger technique. Ultrasound allowed direct visualization of micro puncture needle entry into the lumen of the right common femoral artery. A fluoro-phase sequence was used to document the sheath position. MEDICATIONS: HEPARIN: 0 Units total. CONTRAST:  cc, Omnipaque 300 FLUOROSCOPY TIME:  FLUOROSCOPY TIME: See IR records TECHNIQUE: CATHETERS AND WIRES 5-French JB-1 catheter 0.035" glidewire VESSELS CATHETERIZED Right internal carotid Left internal carotid Left vertebral Right common femoral VESSELS STUDIED Right internal carotid, head Left internal carotid, head Left internal carotid, three-dimensional rotational angiogram Left vertebral Right  femoral PROCEDURAL NARRATIVE A 5-Fr  JB-1 catheter was advanced over a 0.035 glidewire into the aortic arch. The above vessels were then sequentially catheterized and cervical / cerebral angiograms taken. After review of images, the catheter was removed without incident. FINDINGS: Right internal carotid, head: Injection reveals the presence of a widely patent ICA, M1, and A1 segments and their branches. No aneurysms, arteriovenous malformations, or fistulas are identified. There is no significant vasospasm. The parenchymal and venous phases are normal. The venous sinuses are widely patent. Left internal carotid, head: Injection reveals the presence of a widely patent ICA, A1, and M1 segments and their branches. There is an aneurysm arising from the left middle cerebral artery bifurcation. Aneurysm measures approximately 3.5 mm tall, and is approximately 2.5 mm wide. Aneurysm has a relatively wide neck measuring approximately 2.5 mm incorporating the origin of both divisions of the middle cerebral artery. The parenchymal and venous phases are normal. The venous sinuses are widely patent. Left internal carotid, 3D rotation 3-dimensional rotational angiographic images were reconstructed on [employer] workstation for review. These further delineate the above described left middle cerebral artery bifurcation aneurysm. Aneurysm projects laterally, and again appears to incorporate the origin of both middle cerebral artery branches at the neck. Left vertebral: Injection reveals the presence of a widely patent vertebral artery. This leads to a widely patent basilar artery that terminates in bilateral P1. The basilar apex is normal. No aneurysms are identified. There is significant contrast washout due to co-dominance of the contralateral vertebral artery. The parenchymal and venous phases are normal. The venous sinuses are widely patent. Right femoral: Normal vessel. No significant atherosclerotic disease. Arterial sheath in adequate position. DISPOSITION:  Upon completion of the study, the femoral sheath was removed and hemostasis obtained using a 5-Fr ExoSeal closure device. Good proximal and distal lower extremity pulses were documented upon achievement of hemostasis. The patient remained hemodynamically stable throughout the procedure. The patient was kept under anesthesia and transferred to the operating room for surgical clipping. IMPRESSION: 1. 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 Aneurysm is not readily amenable to coil embolization due to wide neck nature and incorporation of the middle cerebral artery branches at the neck. Electronically Signed   By: [name]   On: 11/20/2019 12:01   DG Chest Port 1 View  Result Date: 12/05/2019 CLINICAL DATA:  Ventilator dependent. EXAM: PORTABLE CHEST 1 VIEW COMPARISON:  One-view chest x-ray 12/02/2019 FINDINGS: Heart is mildly enlarged. Tracheostomy tube is stable. Atherosclerotic changes are noted at the aortic arch. Mild increase in a diffuse interstitial pattern is noted. No significant consolidation is present. IMPRESSION: Mild increase in diffuse interstitial pattern compatible with edema. Electronically Signed   By: San Morelle M.D.   On: 12/05/2019 07:43   DG CHEST PORT 1 VIEW  Result Date: 12/02/2019 CLINICAL DATA:  Re-evaluate tracheostomy.  Code stroke. EXAM: PORTABLE CHEST 1 VIEW COMPARISON:  December 01, 2019 FINDINGS: A tracheostomy tube projects over the tracheal air column. Feeding tube terminates below today's film. No pneumothorax. No focal pulmonary infiltrates. The cardiomediastinal silhouette is stable. IMPRESSION: Support apparatus as above.  No acute interval changes. Electronically Signed   By: Dorise Bullion III M.D   On: 12/02/2019 10:53   DG CHEST PORT 1 VIEW  Result Date: 12/01/2019 CLINICAL DATA:  Tracheostomy. EXAM: PORTABLE CHEST 1 VIEW COMPARISON:  11/30/2019. FINDINGS: Tracheostomy tube and feeding  tube in stable position. Heart size stable. Low lung volumes. Persistent but  improved bibasilar atelectasis. No pleural effusion or pneumothorax. No acute bony abnormality. IMPRESSION: 1.  Tracheostomy and feeding tube in stable position. 2. Low lung volumes. Persistent but improved bibasilar atelectasis. Electronically Signed   By: Marcello Moores  Register   On: 12/01/2019 08:22   DG Chest Port 1 View  Result Date: 11/30/2019 CLINICAL DATA:  Tracheostomy placement EXAM: PORTABLE CHEST 1 VIEW COMPARISON:  November 30, 2019 FINDINGS: Tracheostomy now present with tracheostomy catheter tip 3.9 cm above the carina. Feeding tube tip is below the diaphragm. No pneumothorax. There is a small left pleural effusion with left base atelectasis. Lungs elsewhere are clear. Heart size is upper normal with pulmonary vascularity normal. No adenopathy. No bone lesions. IMPRESSION: Tube positions as described without pneumothorax. Left base atelectasis with small left pleural effusion. Lungs elsewhere clear. Stable cardiac silhouette. Aortic Atherosclerosis (ICD10-I70.0). Electronically Signed   By: Lowella Grip III M.D.   On: 11/30/2019 12:57   DG CHEST PORT 1 VIEW  Result Date: 11/30/2019 CLINICAL DATA:  69 year old female intubated, respiratory failure. EXAM: PORTABLE CHEST 1 VIEW COMPARISON:  Portable chest 11/27/2019 and earlier. FINDINGS: Portable AP semi upright view at 0532 hours. Endotracheal tube tip more difficult to identify today but appears to terminates in good position between the level the clavicles and carina. Feeding tube courses into the left abdomen, tip not included. Less rotated today. Normal cardiac size and mediastinal contours. Stable lung volumes and ventilation with mild linear bilateral infrahilar opacity which most resembles atelectasis or scarring. No pneumothorax, pulmonary edema, pleural effusion or other confluent opacity. Negative visible bowel gas pattern. No acute osseous abnormality identified.  IMPRESSION: 1. Endotracheal tube tip in good position. Feeding tube courses into the abdomen, tip not included. 2. Stable ventilation with mild infrahilar atelectasis or scarring. Electronically Signed   By: Genevie Ann M.D.   On: 11/30/2019 08:50   DG Chest Port 1 View  Result Date: 11/27/2019 CLINICAL DATA:  Encounter for respiratory failure. Pt unable to answer hx questions. EXAM: PORTABLE CHEST - 1 VIEW COMPARISON:  the previous day's study FINDINGS: Endotracheal tube and gastric tube stable in position. Some increase in linear opacities at the right lung base. No overt edema. Mild elevation of left diaphragmatic leaflet as before. Heart size and mediastinal contours are within normal limits. Aortic Atherosclerosis (ICD10-170.0). No effusion.  No pneumothorax. Visualized bones unremarkable. IMPRESSION: Slight increase in right lower lobe atelectasis. Electronically Signed   By: Lucrezia Europe M.D.   On: 11/27/2019 07:59   DG Chest Port 1 View  Result Date: 11/26/2019 CLINICAL DATA:  Hypoxia EXAM: PORTABLE CHEST 1 VIEW COMPARISON:  November 21, 2019 FINDINGS: Endotracheal tube tip is 3.3 cm above the carina. Nasogastric tube tip and side port are below the diaphragm. No pneumothorax. There is mild left base atelectasis. No edema or airspace opacity. Heart size and pulmonary vascularity are normal. No adenopathy. There is aortic atherosclerosis. Aorta is mildly prominent. IMPRESSION: Tube positions as described without pneumothorax. Mild left base atelectasis. Lungs otherwise clear. Stable cardiac silhouette. Aortic prominence likely is indicative of a degree of chronic hypertension. Aortic Atherosclerosis (ICD10-I70.0). Electronically Signed   By: Lowella Grip III M.D.   On: 11/26/2019 08:23   DG Chest Port 1 View  Result Date: 11/21/2019 CLINICAL DATA:  Hypoxia EXAM: PORTABLE CHEST 1 VIEW COMPARISON:  November 20, 2019 FINDINGS: Endotracheal tube tip is 2.3 cm above the carina. Nasogastric tube tip and side port  are below the diaphragm. Central catheter tip is in the  left innominate vein near the junction with the superior vena cava. No pneumothorax. There is bibasilar atelectasis. Lungs elsewhere are clear. Heart size and pulmonary vascularity are normal. No adenopathy. There is aortic atherosclerosis. No bone lesions. IMPRESSION: Tube and catheter positions as described without pneumothorax. Bibasilar atelectasis. No edema or airspace opacity. Cardiac silhouette normal. Aortic Atherosclerosis (ICD10-I70.0). Electronically Signed   By: Lowella Grip III M.D.   On: 11/21/2019 08:41   Portable Chest x-ray  Result Date: 11/20/2019 CLINICAL DATA:  Evaluate ET tube placement EXAM: PORTABLE CHEST 1 VIEW COMPARISON:  10/10/2018 FINDINGS: The endotracheal tube tip is above the carina. Heart size appears normal. Chronic asymmetric elevation of the right hemidiaphragm is noted. Rotational artifact is identified the with right hilar structures superimposed upon the right midlung. No pleural effusion, pulmonary edema or airspace consolidation. IMPRESSION: 1. Tip of the ET tube is above the carina. 2. No acute cardiopulmonary abnormalities. Electronically Signed   By: Kerby Moors M.D.   On: 11/20/2019 09:08   DG Abd Portable 1V  Result Date: 11/20/2019 CLINICAL DATA:  Nasogastric tube placement. EXAM: PORTABLE ABDOMEN - 1 VIEW COMPARISON:  None. FINDINGS: Nasogastric tube terminates in the gastric antrum. Bowel gas pattern is unremarkable. Minimal subsegmental atelectasis in the left lower lobe. IMPRESSION: Nasogastric tube terminates in the gastric antrum. Electronically Signed   By: Lorin Picket M.D.   On: 11/20/2019 11:13   ECHOCARDIOGRAM COMPLETE  Result Date: 11/30/2019    ECHOCARDIOGRAM REPORT   Patient Name:   ANADALAY MACDONELL [place] Date of Exam: 11/30/2019 Medical Rec #:  [id number]      Height:       66.0 in Accession #:    [id number]     Weight:       175.5 lb Date of Birth:  09/06/1950       BSA:          1.892 m  Patient Age:    33 years       BP:           104/48 mmHg Patient Gender: F              HR:           100 bpm. Exam Location:  Inpatient Procedure: 2D Echo, Cardiac Doppler and Color Doppler Indications:    Acute Respiratory Insufficiency 518.82 / R06.89  History:        Patient has no prior history of Echocardiogram examinations.                 Stroke; Risk Factors:Hypertension. Seizures.  Sonographer:    Jonelle Sidle Dance Referring Phys: 56 MURALI RAMASWAMY  Sonographer Comments: Echo performed with patient supine and on artificial respirator. IMPRESSIONS  1. Small hyperdynamic LV with likely SAM and LVOT gradient not well characterized on doppler suggest beta blocker and volume if clinically appropriate . Left ventricular ejection fraction, by estimation, is 70 to 75%. The left ventricle has hyperdynamic  function. The left ventricle has no regional wall motion abnormalities. Left ventricular diastolic parameters are consistent with Grade I diastolic dysfunction (impaired relaxation). Elevated left ventricular end-diastolic pressure.  2. Right ventricular systolic function is normal. The right ventricular size is normal.  3. The mitral valve is normal in structure. Mild mitral valve regurgitation. No evidence of mitral stenosis.  4. The aortic valve is normal in structure. Aortic valve regurgitation is not visualized. No aortic stenosis is present.  5. The inferior vena cava is normal in size with greater  than 50% respiratory variability, suggesting right atrial pressure of 3 mmHg. FINDINGS  Left Ventricle: Small hyperdynamic LV with likely SAM and LVOT gradient not well characterized on doppler suggest beta blocker and volume if clinically appropriate. Left ventricular ejection fraction, by estimation, is 70 to 75%. The left ventricle has hyperdynamic function. The left ventricle has no regional wall motion abnormalities. The left ventricular internal cavity size was small. There is no left ventricular  hypertrophy. Left ventricular diastolic parameters are consistent with Grade I diastolic dysfunction (impaired relaxation). Elevated left ventricular end-diastolic pressure. Right Ventricle: The right ventricular size is normal. No increase in right ventricular wall thickness. Right ventricular systolic function is normal. Left Atrium: Left atrial size was normal in size. Right Atrium: Right atrial size was normal in size. Pericardium: There is no evidence of pericardial effusion. Mitral Valve: The mitral valve is normal in structure. Normal mobility of the mitral valve leaflets. Mild mitral valve regurgitation. No evidence of mitral valve stenosis. Tricuspid Valve: The tricuspid valve is normal in structure. Tricuspid valve regurgitation is not demonstrated. No evidence of tricuspid stenosis. Aortic Valve: The aortic valve is normal in structure. Aortic valve regurgitation is not visualized. No aortic stenosis is present. Pulmonic Valve: The pulmonic valve was normal in structure. Pulmonic valve regurgitation is not visualized. No evidence of pulmonic stenosis. Aorta: The aortic root is normal in size and structure. Venous: The inferior vena cava is normal in size with greater than 50% respiratory variability, suggesting right atrial pressure of 3 mmHg. IAS/Shunts: No atrial level shunt detected by color flow Doppler.  LEFT VENTRICLE PLAX 2D LVIDd:         3.80 cm  Diastology LVIDs:         3.40 cm  LV e' lateral:   6.42 cm/s LV PW:         1.00 cm  LV E/e' lateral: 16.2 LV IVS:        0.70 cm  LV e' medial:    6.53 cm/s LVOT diam:     1.90 cm  LV E/e' medial:  15.9 LV SV:         135 LV SV Index:   71 LVOT Area:     2.84 cm  RIGHT VENTRICLE             IVC RV Basal diam:  2.70 cm     IVC diam: 1.90 cm RV S prime:     19.60 cm/s TAPSE (M-mode): 1.8 cm LEFT ATRIUM             Index       RIGHT ATRIUM           Index LA diam:        3.70 cm 1.96 cm/m  RA Area:     11.80 cm LA Vol (A2C):   72.6 ml 38.38 ml/m RA  Volume:   27.00 ml  14.27 ml/m LA Vol (A4C):   73.5 ml 38.85 ml/m LA Biplane Vol: 74.4 ml 39.33 ml/m  AORTIC VALVE LVOT Vmax:   193.00 cm/s LVOT Vmean:  127.000 cm/s LVOT VTI:    0.477 m  AORTA Ao Root diam: 3.30 cm Ao Asc diam:  3.10 cm MITRAL VALVE MV Area (PHT): 2.56 cm     SHUNTS MV Decel Time: 296 msec     Systemic VTI:  0.48 m MV E velocity: 104.00 cm/s  Systemic Diam: 1.90 cm MV A velocity: 130.00 cm/s MV E/A ratio:  0.80 Jenkins Rouge MD  Electronically signed by Jenkins Rouge MD Signature Date/Time: 11/30/2019/5:48:42 PM    Final    CT HEAD CODE STROKE WO CONTRAST  Result Date: 11/19/2019 CLINICAL DATA:  Code stroke. Possible stroke, neuro deficit, acute, stroke suspected. Left facial droop, weakness. EXAM: CT HEAD WITHOUT CONTRAST TECHNIQUE: Contiguous axial images were obtained from the base of the skull through the vertex without intravenous contrast. COMPARISON:  Report from [facility] head 10/13/2018 (images currently unavailable), head CT 10/13/2018. FINDINGS: Brain: There is large volume acute subarachnoid hemorrhage overlying the left cerebral hemisphere, within the left sylvian fissure, within the left MCA cistern, within the interhemispheric fissure and within the basal cisterns. Subarachnoid or subdural hemorrhage is also present along the tentorium bilaterally. There is associated mass effect with 3 mm rightward midline shift. No hydrocephalus. 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 Redemonstrated chronic lacunar infarct within the right basal ganglia/internal capsule. Lacunar infarcts within the left basal ganglia/internal capsule are new from prior examination 10/13/2018 but are favored chronic. Background mild ill-defined hypoattenuation within the cerebral white matter which is nonspecific, but consistent with chronic small vessel ischemic disease. No evidence of intracranial mass. Vascular: No  hyperdense vessel. 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 Skull: Normal. Negative for fracture or focal lesion. Sinuses/Orbits: Visualized orbits show no acute finding. Mild ethmoid and maxillary sinus mucosal thickening. No significant mastoid effusion These results were called by telephone at the time of interpretation on 11/19/2019 at 3:13 pm to provider Dr. Cheral Marker, Who verbally acknowledged these results. IMPRESSION: Large volume acute subarachnoid hemorrhage overlying the left cerebral hemisphere, within the left sylvian fissure, within the left MCA cistern, within the interhemispheric fissure and within the basal cisterns. Subarachnoid or subdural hemorrhage is also present along the tentorium bilaterally. 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 Mass effect with 3 mm rightward midline shift.  No hydrocephalus. 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 Lacunar infarcts within the left basal ganglia/internal capsule are new since prior head CT 10/13/2018, but appear chronic. Otherwise unchanged chronic small vessel ischemic disease. Electronically Signed   By: Kellie Simmering DO   On: 11/19/2019 15:14   VAS [place] LOWER EXTREMITY VENOUS (DVT)  Result Date: 12/04/2019  Lower Venous DVTStudy Indications: UE DVT.  Comparison Study: No prior studies. Performing Technologist: Oliver Hum RVT  Examination Guidelines: A complete evaluation includes B-mode imaging, spectral Doppler, color Doppler, and power Doppler as needed of all accessible portions of each vessel. Bilateral testing is considered an integral part of a complete examination. Limited examinations for reoccurring indications may be performed as noted. The reflux portion of the exam is performed with the patient in reverse Trendelenburg.   +---------+---------------+---------+-----------+----------+--------------+ RIGHT    CompressibilityPhasicitySpontaneityPropertiesThrombus Aging +---------+---------------+---------+-----------+----------+--------------+ CFV      Full           Yes      Yes                                 +---------+---------------+---------+-----------+----------+--------------+ SFJ      Full                                                        +---------+---------------+---------+-----------+----------+--------------+  FV Prox  Full                                                        +---------+---------------+---------+-----------+----------+--------------+ FV Mid   Full                                                        +---------+---------------+---------+-----------+----------+--------------+ FV DistalFull                                                        +---------+---------------+---------+-----------+----------+--------------+ PFV      Full                                                        +---------+---------------+---------+-----------+----------+--------------+ POP      Full           Yes      Yes                                 +---------+---------------+---------+-----------+----------+--------------+ PTV      Full                                                        +---------+---------------+---------+-----------+----------+--------------+ PERO     Full                                                        +---------+---------------+---------+-----------+----------+--------------+   +---------+---------------+---------+-----------+----------+--------------+ LEFT     CompressibilityPhasicitySpontaneityPropertiesThrombus Aging +---------+---------------+---------+-----------+----------+--------------+ CFV      Full           Yes      Yes                                  +---------+---------------+---------+-----------+----------+--------------+ SFJ      Full                                                        +---------+---------------+---------+-----------+----------+--------------+ FV Prox  Full                                                        +---------+---------------+---------+-----------+----------+--------------+  FV Mid   Full                                                        +---------+---------------+---------+-----------+----------+--------------+ FV DistalFull                                                        +---------+---------------+---------+-----------+----------+--------------+ PFV      Full                                                        +---------+---------------+---------+-----------+----------+--------------+ POP      Full           Yes      Yes                                 +---------+---------------+---------+-----------+----------+--------------+ PTV      Full                                                        +---------+---------------+---------+-----------+----------+--------------+ PERO     Full                                                        +---------+---------------+---------+-----------+----------+--------------+     Summary: RIGHT: - There is no evidence of deep vein thrombosis in the lower extremity.  - No cystic structure found in the popliteal fossa.  LEFT: - There is no evidence of deep vein thrombosis in the lower extremity.  - No cystic structure found in the popliteal fossa.  *See table(s) above for measurements and observations. Electronically signed by Ruta Hinds MD on 12/04/2019 at 3:35:24 PM.    Final    VAS [place] UPPER EXTREMITY VENOUS DUPLEX  Result Date: 12/04/2019 UPPER VENOUS STUDY  Indications: pitting edema of arms, and stroke Limitations: Bandages and line. Comparison Study: none Performing Technologist: June Leap RDMS, RVT   Examination Guidelines: A complete evaluation includes B-mode imaging, spectral Doppler, color Doppler, and power Doppler as needed of all accessible portions of each vessel. Bilateral testing is considered an integral part of a complete examination. Limited examinations for reoccurring indications may be performed as noted.  Right Findings: +----------+------------+---------+-----------+----------+---------------------+ RIGHT     CompressiblePhasicitySpontaneousProperties       Summary        +----------+------------+---------+-----------+----------+---------------------+ IJV  Not visualized     +----------+------------+---------+-----------+----------+---------------------+ Subclavian    Full       Yes       Yes                                    +----------+------------+---------+-----------+----------+---------------------+ Axillary      Full       Yes       Yes                                    +----------+------------+---------+-----------+----------+---------------------+ Brachial      Full       Yes       Yes                not all segments                                                             visualized       +----------+------------+---------+-----------+----------+---------------------+ Radial        Full                                                        +----------+------------+---------+-----------+----------+---------------------+ Ulnar                                                  Not visualized     +----------+------------+---------+-----------+----------+---------------------+ Cephalic      Full                                                        +----------+------------+---------+-----------+----------+---------------------+ Basilic                                                Not visualized      +----------+------------+---------+-----------+----------+---------------------+  Left Findings: +----------+------------+---------+-----------+----------+--------------+ LEFT      CompressiblePhasicitySpontaneousProperties   Summary     +----------+------------+---------+-----------+----------+--------------+ IJV                                                 Not visualized +----------+------------+---------+-----------+----------+--------------+ Subclavian    Full       Yes       Yes                             +----------+------------+---------+-----------+----------+--------------+ Axillary      None       No  No                              +----------+------------+---------+-----------+----------+--------------+ Brachial      None       No        Yes                             +----------+------------+---------+-----------+----------+--------------+ Radial        Full                                                 +----------+------------+---------+-----------+----------+--------------+ Ulnar         Full                                                 +----------+------------+---------+-----------+----------+--------------+ Cephalic      None                                   AC/ forearm   +----------+------------+---------+-----------+----------+--------------+ Basilic       Full                                                 +----------+------------+---------+-----------+----------+--------------+  Summary:  Right: No evidence of deep vein thrombosis in the upper extremity. No evidence of superficial vein thrombosis in the upper extremity. Limited study.  Left: Findings consistent with acute deep vein thrombosis involving the left axillary vein and left brachial veins. Findings consistent with acute superficial vein thrombosis involving the left cephalic vein. Limited study.  *See table(s) above for measurements and observations.   Diagnosing physician: Ruta Hinds MD Electronically signed by Ruta Hinds MD on 12/04/2019 at 3:37:22 PM.    Final    VAS [place] TRANSCRANIAL DOPPLER  Result Date: 12/04/2019  Transcranial Doppler Indications: Subarachnoid hemorrhage. Comparison Study: 11/29/19 previous Performing Technologist: Abram Sander RVS  Examination Guidelines: A complete evaluation includes B-mode imaging, spectral Doppler, color Doppler, and power Doppler as needed of all accessible portions of each vessel. Bilateral testing is considered an integral part of a complete examination. Limited examinations for reoccurring indications may be performed as noted.  +----------+-------------+----------+-----------+-------+ RIGHT TCD Right VM (cm)Depth (cm)PulsatilityComment +----------+-------------+----------+-----------+-------+ MCA           55.00                 1.34            +----------+-------------+----------+-----------+-------+ Term ICA      36.00                 1.06            +----------+-------------+----------+-----------+-------+ PCA           31.00                 1.25            +----------+-------------+----------+-----------+-------+ Opthalmic     26.00  2.08            +----------+-------------+----------+-----------+-------+ ICA siphon    69.00                 1.79            +----------+-------------+----------+-----------+-------+  +----------+------------+----------+-----------+-------+ LEFT TCD  Left VM (cm)Depth (cm)PulsatilityComment +----------+------------+----------+-----------+-------+ MCA          43.00                 1.35            +----------+------------+----------+-----------+-------+ PCA          30.00                 1.20            +----------+------------+----------+-----------+-------+ Opthalmic    36.00                 1.90            +----------+------------+----------+-----------+-------+ ICA siphon   41.00                  1.84            +----------+------------+----------+-----------+-------+  Summary:  Absent occipital window limits evaluation of posterior circulation. Normal mean flow velocities in anterior circulation and no evidence of vasospasm. *See table(s) above for TCD measurements and observations.  Diagnosing physician: Antony Contras MD Electronically signed by Antony Contras MD on 12/04/2019 at 1:38:13 PM.    Final    VAS [place] TRANSCRANIAL DOPPLER  Result Date: 11/30/2019  Transcranial Doppler Indications: Subarachnoid hemorrhage. Comparison Study: 11/27/19 Performing Technologist: June Leap RDMS, RVT  Examination Guidelines: A complete evaluation includes B-mode imaging, spectral Doppler, color Doppler, and power Doppler as needed of all accessible portions of each vessel. Bilateral testing is considered an integral part of a complete examination. Limited examinations for reoccurring indications may be performed as noted.  +----------+-------------+----------+-----------+-------+ RIGHT TCD Right VM (cm)Depth (cm)PulsatilityComment +----------+-------------+----------+-----------+-------+ MCA           90.00                 1.41            +----------+-------------+----------+-----------+-------+ ACA          -76.00                 1.07            +----------+-------------+----------+-----------+-------+ Term ICA      52.00                 1.34            +----------+-------------+----------+-----------+-------+ PCA           36.00                 1.02            +----------+-------------+----------+-----------+-------+ Opthalmic     13.00                 1.44            +----------+-------------+----------+-----------+-------+ ICA siphon    88.00                 1.48            +----------+-------------+----------+-----------+-------+ Vertebral    -63.00                 0.89            +----------+-------------+----------+-----------+-------+   +----------+------------+----------+-----------+-------+  LEFT TCD  Left VM (cm)Depth (cm)PulsatilityComment +----------+------------+----------+-----------+-------+ MCA          95.00                 1.07            +----------+------------+----------+-----------+-------+ ACA          -53.00                 1.5            +----------+------------+----------+-----------+-------+ Term ICA     43.00                 1.34            +----------+------------+----------+-----------+-------+ PCA          23.00                 1.25            +----------+------------+----------+-----------+-------+ Opthalmic    24.00                 1.64            +----------+------------+----------+-----------+-------+ ICA siphon   97.00                 1.38            +----------+------------+----------+-----------+-------+ Vertebral    -42.00                1.02            +----------+------------+----------+-----------+-------+  +------------+-------+-------+             VM cm/sComment +------------+-------+-------+ Prox Basilar-53.00         +------------+-------+-------+ Dist Basilar-53.00         +------------+-------+-------+ Summary:  Elevated bilateral middle cerebral artery mean flow velocities suggest mild vasospasm. Normal mean flow velocities in remaining identified vessles of anterior and posterior cerebral circulations. *See table(s) above for TCD measurements and observations.  Diagnosing physician: Antony Contras MD Electronically signed by Antony Contras MD on 11/30/2019 at 8:43:04 AM.    Final    VAS [place] TRANSCRANIAL DOPPLER  Result Date: 11/29/2019  Transcranial Doppler Indications: Subarachnoid hemorrhage. Limitations: bandaging Comparison Study: 11/24/19 previous Performing Technologist: Abram Sander RVS  Examination Guidelines: A complete evaluation includes B-mode imaging, spectral Doppler, color Doppler, and power Doppler as needed of all accessible portions  of each vessel. Bilateral testing is considered an integral part of a complete examination. Limited examinations for reoccurring indications may be performed as noted.  +----------+-------------+----------+-----------+-------+ RIGHT TCD Right VM (cm)Depth (cm)PulsatilityComment +----------+-------------+----------+-----------+-------+ MCA           77.00                 1.55            +----------+-------------+----------+-----------+-------+ PCA           21.00                 1.22            +----------+-------------+----------+-----------+-------+ ICA siphon    67.00                 1.48            +----------+-------------+----------+-----------+-------+  +----------+------------+----------+-----------+-------+ LEFT TCD  Left VM (cm)Depth (cm)PulsatilityComment +----------+------------+----------+-----------+-------+ MCA          47.00                 1.46            +----------+------------+----------+-----------+-------+  ACA          -38.00                1.40            +----------+------------+----------+-----------+-------+ PCA          55.00                 1.18            +----------+------------+----------+-----------+-------+ Opthalmic    20.00                 3.00            +----------+------------+----------+-----------+-------+ ICA siphon   48.00                 1.48            +----------+------------+----------+-----------+-------+  Summary:  Absent suboccipital window limits evaluation of posterior circulation. Normal mean flow velocities in anterior cerebral circulation without evidence of vasospasm. *See table(s) above for TCD measurements and observations.  Diagnosing physician: Antony Contras MD Electronically signed by Antony Contras MD on 11/29/2019 at 8:09:42 AM.    Final    VAS [place] TRANSCRANIAL DOPPLER  Result Date: 11/24/2019  Transcranial Doppler Indications: Subarachnoid hemorrhage. Limitations: Bandages, patient positioning,  vent. Limitations for diagnostic windows: Unable to insonate right transtemporal window. Comparison Study: 11/22/2019 Performing Technologist: Oliver Hum RVT  Examination Guidelines: A complete evaluation includes B-mode imaging, spectral Doppler, color Doppler, and power Doppler as needed of all accessible portions of each vessel. Bilateral testing is considered an integral part of a complete examination. Limited examinations for reoccurring indications may be performed as noted.  +----------+-------------+----------+-----------+-------+ RIGHT TCD Right VM (cm)Depth (cm)PulsatilityComment +----------+-------------+----------+-----------+-------+ Opthalmic     20.00                 1.43            +----------+-------------+----------+-----------+-------+ ICA siphon    32.00                 1.32            +----------+-------------+----------+-----------+-------+ Vertebral    -38.00                 1.52            +----------+-------------+----------+-----------+-------+  +----------+------------+----------+-----------+-------+ LEFT TCD  Left VM (cm)Depth (cm)PulsatilityComment +----------+------------+----------+-----------+-------+ MCA          68.00                 1.11            +----------+------------+----------+-----------+-------+ ACA          -20.00                1.33            +----------+------------+----------+-----------+-------+ Term ICA     39.00                 1.29            +----------+------------+----------+-----------+-------+ PCA          36.00                 1.13            +----------+------------+----------+-----------+-------+ Opthalmic    29.00                 1.88            +----------+------------+----------+-----------+-------+ ICA siphon  36.00                 1.97            +----------+------------+----------+-----------+-------+ Vertebral    -45.00                1.45             +----------+------------+----------+-----------+-------+  +------------+-------+-------+             VM cm/sComment +------------+-------+-------+ Prox Basilar-44.00  1.48   +------------+-------+-------+ Dist Basilar-33.00  1.65   +------------+-------+-------+ Summary:  Absent right temoral window limuits exam. Normal mean flow velocities in remaining identified vessels of anterior and posterior cerebral circulation. No evidence of vasospasm noted. *See table(s) above for TCD measurements and observations.  Diagnosing physician: Antony Contras MD Electronically signed by Antony Contras MD on 11/24/2019 at 1:21:52 PM.    Final    VAS [place] TRANSCRANIAL DOPPLER  Result Date: 11/23/2019  Transcranial Doppler Indications: Subarachnoid hemorrhage. Limitations: 11/20/2019 - LEFT CRANIOTOMY INTRACRANIAL ANEURYSM CLIPPING, patient              immobility, bandages, vent. Limitations for diagnostic windows: Unable to insonate left transtemporal window. Unable to insonate occipital window. Comparison Study: 11/21/2019 Performing Technologist: Oliver Hum RVT  Examination Guidelines: A complete evaluation includes B-mode imaging, spectral Doppler, color Doppler, and power Doppler as needed of all accessible portions of each vessel. Bilateral testing is considered an integral part of a complete examination. Limited examinations for reoccurring indications may be performed as noted.  +----------+-------------+----------+-----------+-------+ RIGHT TCD Right VM (cm)Depth (cm)PulsatilityComment +----------+-------------+----------+-----------+-------+ MCA           41.00                 1.85            +----------+-------------+----------+-----------+-------+ PCA           28.00                 1.21            +----------+-------------+----------+-----------+-------+ Opthalmic     17.00                 1.76            +----------+-------------+----------+-----------+-------+ ICA siphon    34.00                  1.55            +----------+-------------+----------+-----------+-------+  +----------+------------+----------+-----------+-------+ LEFT TCD  Left VM (cm)Depth (cm)PulsatilityComment +----------+------------+----------+-----------+-------+ Opthalmic    17.00                 1.63            +----------+------------+----------+-----------+-------+ ICA siphon   51.00                 1.11            +----------+------------+----------+-----------+-------+  Summary:  Absent left temporal and suboccpital window limits exam. Normal mean flow velocitie sin right middle cerebral, posteriorcerebral, bilateral opthalmics. carotid siphons. No definite evidence of vasospasm noted. *See table(s) above for TCD measurements and observations.  Diagnosing physician: Antony Contras MD Electronically signed by Antony Contras MD on 11/23/2019 at 6:47:40 PM.    Final    VAS [place] TRANSCRANIAL DOPPLER  Result Date: 11/22/2019  Transcranial Doppler Indications: Subarachnoid hemorrhage. Limitations for diagnostic windows: Unable to insonate left transtemporal window. Unable to insonate occipital window. Performing Technologist: June Leap RDMS, RVT  Examination Guidelines: A complete evaluation includes B-mode imaging, spectral Doppler, color Doppler, and power Doppler as needed of all accessible portions of each vessel. Bilateral testing is considered an integral part of a complete examination. Limited examinations for reoccurring indications may be performed as noted.  +----------+-------------+----------+-----------+-------+ RIGHT TCD Right VM (cm)Depth (cm)PulsatilityComment +----------+-------------+----------+-----------+-------+ MCA           51.00                 1.55            +----------+-------------+----------+-----------+-------+ ACA          -44.00                 1.67            +----------+-------------+----------+-----------+-------+ PCA           23.00                  1.44            +----------+-------------+----------+-----------+-------+ Opthalmic     23.00                 1.33            +----------+-------------+----------+-----------+-------+ ICA siphon    38.00                 1.92            +----------+-------------+----------+-----------+-------+  +----------+------------+----------+-----------+-------+ LEFT TCD  Left VM (cm)Depth (cm)PulsatilityComment +----------+------------+----------+-----------+-------+ Opthalmic    25.00                 1.66            +----------+------------+----------+-----------+-------+ ICA siphon   43.00                 1.69            +----------+------------+----------+-----------+-------+  Summary:  Limited TCD Study due to absent left temporal and suboccipital windows. Normal mean flow velocities in right middle, anterior, posterior cerebral and bilateral opthalmic and carotid siphons. No definite evidence of vasospasm in vessels studied noted. *See table(s) above for TCD measurements and observations.  Diagnosing physician: Antony Contras MD Electronically signed by Antony Contras MD on 11/22/2019 at 1:29:36 PM.    Final    IR ANGIO INTRA EXTRACRAN SEL INTERNAL CAROTID BILAT MOD SED  Result Date: 11/20/2019 PROCEDURE: DIAGNOSTIC CEREBRAL ANGIOGRAM HISTORY: The patient is a 69 year old woman presenting to the hospital after being found unresponsive. 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 Patient progressively declined overnight requiring external ventricular drain placement and intubation for airway protection. She presents for diagnostic cerebral angiogram for further workup with possible aneurysm coiling. ACCESS: The technical aspects of the procedure as well as its potential risks and benefits were reviewed with the patient's daughter. These risks included but were not limited  bleeding, infection, allergic reaction, damage to organs or vital structures, stroke, non-diagnostic procedure, and the catastrophic outcomes of heart attack, coma, and death. With an understanding of these risks, informed consent was obtained and witnessed. The patient was placed in the supine position on the angiography table and the skin of right groin prepped in the usual sterile fashion. The procedure was performed under general anesthesia administered by the anesthesia service. A 5- French sheath was introduced in the right common femoral artery under ultrasound guidance using Seldinger technique. Ultrasound allowed direct  visualization of micro puncture needle entry into the lumen of the right common femoral artery. A fluoro-phase sequence was used to document the sheath position. MEDICATIONS: HEPARIN: 0 Units total. CONTRAST:  cc, Omnipaque 300 FLUOROSCOPY TIME:  FLUOROSCOPY TIME: See IR records TECHNIQUE: CATHETERS AND WIRES 5-French JB-1 catheter 0.035" glidewire VESSELS CATHETERIZED Right internal carotid Left internal carotid Left vertebral Right common femoral VESSELS STUDIED Right internal carotid, head Left internal carotid, head Left internal carotid, three-dimensional rotational angiogram Left vertebral Right femoral PROCEDURAL NARRATIVE A 5-Fr JB-1 catheter was advanced over a 0.035 glidewire into the aortic arch. The above vessels were then sequentially catheterized and cervical / cerebral angiograms taken. After review of images, the catheter was removed without incident. FINDINGS: Right internal carotid, head: Injection reveals the presence of a widely patent ICA, M1, and A1 segments and their branches. No aneurysms, arteriovenous malformations, or fistulas are identified. There is no significant vasospasm. The parenchymal and venous phases are normal. The venous sinuses are widely patent. Left internal carotid, head: Injection reveals the presence of a widely patent ICA, A1, and M1 segments and  their branches. There is an aneurysm arising from the left middle cerebral artery bifurcation. Aneurysm measures approximately 3.5 mm tall, and is approximately 2.5 mm wide. Aneurysm has a relatively wide neck measuring approximately 2.5 mm incorporating the origin of both divisions of the middle cerebral artery. The parenchymal and venous phases are normal. The venous sinuses are widely patent. Left internal carotid, 3D rotation 3-dimensional rotational angiographic images were reconstructed on [employer] workstation for review. These further delineate the above described left middle cerebral artery bifurcation aneurysm. Aneurysm projects laterally, and again appears to incorporate the origin of both middle cerebral artery branches at the neck. Left vertebral: Injection reveals the presence of a widely patent vertebral artery. This leads to a widely patent basilar artery that terminates in bilateral P1. The basilar apex is normal. No aneurysms are identified. There is significant contrast washout due to co-dominance of the contralateral vertebral artery. The parenchymal and venous phases are normal. The venous sinuses are widely patent. Right femoral: Normal vessel. No significant atherosclerotic disease. Arterial sheath in adequate position. DISPOSITION: Upon completion of the study, the femoral sheath was removed and hemostasis obtained using a 5-Fr ExoSeal closure device. Good proximal and distal lower extremity pulses were documented upon achievement of hemostasis. The patient remained hemodynamically stable throughout the procedure. The patient was kept under anesthesia and transferred to the operating room for surgical clipping. IMPRESSION: 1. 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 Aneurysm is not readily amenable to coil embolization due to wide neck nature and incorporation of the middle cerebral artery branches at the neck. Electronically  Signed   By: [name]   On: 11/20/2019 12:01   IR ANGIO VERTEBRAL SEL VERTEBRAL UNI L MOD SED  Result Date: 11/20/2019 PROCEDURE: DIAGNOSTIC CEREBRAL ANGIOGRAM HISTORY: The patient is a 69 year old woman presenting to the hospital after being found unresponsive. 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 Patient progressively declined overnight requiring external ventricular drain placement and intubation for airway protection. She presents for diagnostic cerebral angiogram for further workup with possible aneurysm coiling. ACCESS: The technical aspects of the procedure as well as its potential risks and benefits were reviewed with the patient's daughter. These risks included but were not limited bleeding, infection, allergic reaction, damage to organs or  vital structures, stroke, non-diagnostic procedure, and the catastrophic outcomes of heart attack, coma, and death. With an understanding of these risks, informed consent was obtained and witnessed. The patient was placed in the supine position on the angiography table and the skin of right groin prepped in the usual sterile fashion. The procedure was performed under general anesthesia administered by the anesthesia service. A 5- French sheath was introduced in the right common femoral artery under ultrasound guidance using Seldinger technique. Ultrasound allowed direct visualization of micro puncture needle entry into the lumen of the right common femoral artery. A fluoro-phase sequence was used to document the sheath position. MEDICATIONS: HEPARIN: 0 Units total. CONTRAST:  cc, Omnipaque 300 FLUOROSCOPY TIME:  FLUOROSCOPY TIME: See IR records TECHNIQUE: CATHETERS AND WIRES 5-French JB-1 catheter 0.035" glidewire VESSELS CATHETERIZED Right internal carotid Left internal carotid Left vertebral Right common femoral VESSELS STUDIED  Right internal carotid, head Left internal carotid, head Left internal carotid, three-dimensional rotational angiogram Left vertebral Right femoral PROCEDURAL NARRATIVE A 5-Fr JB-1 catheter was advanced over a 0.035 glidewire into the aortic arch. The above vessels were then sequentially catheterized and cervical / cerebral angiograms taken. After review of images, the catheter was removed without incident. FINDINGS: Right internal carotid, head: Injection reveals the presence of a widely patent ICA, M1, and A1 segments and their branches. No aneurysms, arteriovenous malformations, or fistulas are identified. There is no significant vasospasm. The parenchymal and venous phases are normal. The venous sinuses are widely patent. Left internal carotid, head: Injection reveals the presence of a widely patent ICA, A1, and M1 segments and their branches. There is an aneurysm arising from the left middle cerebral artery bifurcation. Aneurysm measures approximately 3.5 mm tall, and is approximately 2.5 mm wide. Aneurysm has a relatively wide neck measuring approximately 2.5 mm incorporating the origin of both divisions of the middle cerebral artery. The parenchymal and venous phases are normal. The venous sinuses are widely patent. Left internal carotid, 3D rotation 3-dimensional rotational angiographic images were reconstructed on [employer] workstation for review. These further delineate the above described left middle cerebral artery bifurcation aneurysm. Aneurysm projects laterally, and again appears to incorporate the origin of both middle cerebral artery branches at the neck. Left vertebral: Injection reveals the presence of a widely patent vertebral artery. This leads to a widely patent basilar artery that terminates in bilateral P1. The basilar apex is normal. No aneurysms are identified. There is significant contrast washout due to co-dominance of the contralateral vertebral artery. The parenchymal and venous  phases are normal. The venous sinuses are widely patent. Right femoral: Normal vessel. No significant atherosclerotic disease. Arterial sheath in adequate position. DISPOSITION: Upon completion of the study, the femoral sheath was removed and hemostasis obtained using a 5-Fr ExoSeal closure device. Good proximal and distal lower extremity pulses were documented upon achievement of hemostasis. The patient remained hemodynamically stable throughout the procedure. The patient was kept under anesthesia and transferred to the operating room for surgical clipping. IMPRESSION: 1. 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 Aneurysm is not readily amenable to coil embolization due to wide neck nature and incorporation of the middle cerebral artery branches at the neck. Electronically Signed   By: [name]   On: 11/20/2019 12:01    Labs:  CBC: Recent Labs    12/03/19 0329 12/03/19 0329 12/04/19 0445 12/04/19 0445 12/04/19 0857 12/05/19 0300 12/05/19 0510 12/06/19 0935  WBC 14.1*  --  9.6  --   --   --  9.0 8.9  HGB 8.3*   < > 7.4*   < > 7.5* 6.5* 6.8* 7.9*  HCT 28.0*   < > 23.9*   < > 22.0* 19.0* 21.8* 25.4*  PLT 572*  --  524*  --   --   --  414* 488*   < > = values in this interval not displayed.    COAGS: Recent Labs    11/19/19 1459 11/29/19 2136 11/30/19 0656  INR 1.0 1.1 1.1  APTT 32  --   --     BMP: Recent Labs    12/04/19 0445 12/04/19 0857 12/04/19 1809 12/05/19 0300 12/05/19 0510 12/06/19 0935  NA 146*   < > 141 141 138 143  K 4.5   < > 4.6 4.5 4.7 4.4  CL 117*  --  113*  --  109 112*  CO2 19*  --  18*  --  19* 20*  GLUCOSE 71  --  197*  --  251* 185*  BUN 85*  --  84*  --  77* 71*  CALCIUM 9.4  --  9.0  --  9.0 9.3  CREATININE 1.85*  --  1.74*  --  1.62* 1.51*  GFRNONAA 27*  --  29*  --  32* 35*  GFRAA 31*  --  34*  --  37* 40*   < > = values in this interval not displayed.    LIVER FUNCTION  TESTS: Recent Labs    11/29/19 1227 11/30/19 0656 12/01/19 0607 12/02/19 0002  BILITOT 0.4 0.2* 0.4 0.5  AST 22 19 18 21   ALT 34 30 32 30  ALKPHOS 117 126 123 123  PROT 6.1* 6.1* 6.2* 6.3*  ALBUMIN 2.2* 2.3* 2.3* 2.4*     Assessment and Plan:  Dysphagia. Plan for image-guided percutaneous gastrostomy tube placement tentatively for tomorrow 12/07/2019 in IR pending scheduling. Case/images have been reviewed by Dr. Earleen Newport who approves procedure. Patient will be NPO at midnight. Currently febrile but WBCs WNL- will recheck temperature in AM, will proceed if afebrile. She does not take blood thinners. INR 1.1 11/30/2019.  Risks and benefits discussed with the patient including, but not limited to the need for a barium enema during the procedure, bleeding, infection, peritonitis, or damage to adjacent structures. All of the patient's questions were answered, patient is agreeable to proceed. Consent obtained by patient's daughter, Myldred Raju, via telephone- signed and in IR control room.   Thank you for this interesting consult.  I greatly enjoyed meeting [place] and look forward to participating in their care.  A copy of this report was sent to the requesting provider on this date.  Electronically Signed: Earley Abide, PA-C 12/06/2019, 1:23 PM   I spent a total of 40 Minutes in face to face in clinical consultation, greater than 50% of which was counseling/coordinating care for dysphasia/gastrostomy tube placement.

## 2019-12-07 ENCOUNTER — Inpatient Hospital Stay (HOSPITAL_COMMUNITY): Payer: Medicare Other

## 2019-12-07 DIAGNOSIS — I609 Nontraumatic subarachnoid hemorrhage, unspecified: Secondary | ICD-10-CM | POA: Diagnosis not present

## 2019-12-07 DIAGNOSIS — I6012 Nontraumatic subarachnoid hemorrhage from left middle cerebral artery: Secondary | ICD-10-CM | POA: Diagnosis not present

## 2019-12-07 HISTORY — PX: IR GASTROSTOMY TUBE MOD SED: IMG625

## 2019-12-07 LAB — GLUCOSE, CAPILLARY
Glucose-Capillary: 39 mg/dL — CL (ref 70–99)
Glucose-Capillary: 69 mg/dL — ABNORMAL LOW (ref 70–99)
Glucose-Capillary: 91 mg/dL (ref 70–99)
Glucose-Capillary: 117 mg/dL — ABNORMAL HIGH (ref 70–99)
Glucose-Capillary: 205 mg/dL — ABNORMAL HIGH (ref 70–99)
Glucose-Capillary: 129 mg/dL — ABNORMAL HIGH (ref 70–99)

## 2019-12-07 LAB — CULTURE, BLOOD (ROUTINE X 2)
Culture: NO GROWTH
Culture: NO GROWTH
Special Requests: ADEQUATE
Special Requests: ADEQUATE

## 2019-12-07 MED ORDER — GLUCAGON HCL (RDNA) 1 MG IJ SOLR
INTRAMUSCULAR | Status: AC | PRN
  Administered 2019-12-07: 1 mg via INTRAVENOUS

## 2019-12-07 MED ORDER — GLUCAGON HCL RDNA (DIAGNOSTIC) 1 MG IJ SOLR
INTRAMUSCULAR | Status: AC
  Filled 2019-12-07: qty 1

## 2019-12-07 MED ORDER — IOHEXOL 300 MG/ML  SOLN
50.0000 mL | Freq: Once | INTRAMUSCULAR | Status: AC | PRN
  Administered 2019-12-07: 20 mL

## 2019-12-07 MED ORDER — LIDOCAINE HCL 1 % IJ SOLN
INTRAMUSCULAR | Status: AC
  Filled 2019-12-07: qty 20

## 2019-12-07 MED ORDER — MIDAZOLAM HCL 2 MG/2ML IJ SOLN
INTRAMUSCULAR | Status: AC
  Filled 2019-12-07: qty 2

## 2019-12-07 MED ORDER — VANCOMYCIN HCL IN DEXTROSE 1-5 GM/200ML-% IV SOLN: 1000.0000 mg | Freq: Once | INTRAVENOUS | Status: AC

## 2019-12-07 MED ORDER — FREE WATER
200.0000 mL | Freq: Four times a day (QID) | Status: DC
  Administered 2019-12-07 – 2019-12-12 (×16): 200 mL

## 2019-12-07 MED ORDER — MIDAZOLAM HCL 2 MG/2ML IJ SOLN
INTRAMUSCULAR | Status: AC | PRN
  Administered 2019-12-07 (×2): 0.5 mg via INTRAVENOUS

## 2019-12-07 MED ORDER — FENTANYL CITRATE (PF) 100 MCG/2ML IJ SOLN
INTRAMUSCULAR | Status: AC | PRN
  Administered 2019-12-07: 25 ug via INTRAVENOUS

## 2019-12-07 MED ORDER — JEVITY 1.2 CAL PO LIQD
1000.0000 mL | ORAL | Status: DC
  Administered 2019-12-07 – 2019-12-17 (×10): 1000 mL
  Administered 2019-12-18: 50 mL/h
  Administered 2019-12-19 – 2019-12-20 (×2): 1000 mL
  Filled 2019-12-07 (×16): qty 1000

## 2019-12-07 MED ORDER — DEXTROSE 50 % IV SOLN
INTRAVENOUS | Status: AC
  Administered 2019-12-07: 25 mL
  Filled 2019-12-07: qty 50

## 2019-12-07 MED ORDER — FENTANYL CITRATE (PF) 100 MCG/2ML IJ SOLN
INTRAMUSCULAR | Status: AC
  Filled 2019-12-07: qty 2

## 2019-12-07 MED ORDER — VANCOMYCIN HCL IN DEXTROSE 1-5 GM/200ML-% IV SOLN
INTRAVENOUS | Status: AC
  Administered 2019-12-07: 1000 mg via INTRAVENOUS
  Filled 2019-12-07: qty 200

## 2019-12-07 MED ORDER — GLYCOPYRROLATE 0.2 MG/ML IJ SOLN
0.1000 mg | Freq: Three times a day (TID) | INTRAMUSCULAR | Status: AC
  Administered 2019-12-07 – 2019-12-10 (×9): 0.1 mg via INTRAVENOUS
  Filled 2019-12-07 (×7): qty 1

## 2019-12-07 NOTE — Progress Notes (Signed)
Inpatient Diabetes Program Recommendations  AACE/ADA: New Consensus Statement on Inpatient Glycemic Control (2015)  Target Ranges:  Prepandial:   less than 140 mg/dL      Peak postprandial:   less than 180 mg/dL (1-2 hours)      Critically ill patients:  140 - 180 mg/dL   Lab Results  Component Value Date   GLUCAP 117 (H) 12/07/2019   HGBA1C 7.8 (H) 11/21/2019    Review of Glycemic Control Results for Rebecca Green, Rebecca Green (MRN [id number]) as of 12/07/2019 13:06  Ref. Range 12/07/2019 03:42 12/07/2019 04:24 12/07/2019 04:49 12/07/2019 08:30  Glucose-Capillary Latest Ref Range: 70 - 99 mg/dL 39 (LL) 69 (L) 91 117 (H)   Diabetes history: Type 2 DM Current orders for Inpatient glycemic control: Novolog 0-9 units Q4H, Levemir 12 units QD NPO  Inpatient Diabetes Program Recommendations:    Noted hypoglycemia of 39 mg/dL following 2 units of Novolog.Patient NPO to place PEG. Tube feeds were paused.  Levemir held this am. Consider decreasing Levemir to 6 units QD.   Thanks, Bronson Curb, MSN, RNC-OB Diabetes Coordinator [phone] (8a-5p)

## 2019-12-07 NOTE — Progress Notes (Signed)
  NEUROSURGERY PROGRESS NOTE   No issues overnight.  No change in neuro status with clamping of EVD  EXAM:  BP (!) 152/75   Pulse 88   Temp 99.5 F (37.5 C) (Oral)   Resp (!) 31   Ht 5\' 6"  (1.676 m)   Wt 78 kg   SpO2 99%   BMI 27.75 kg/m   Opens eyes sponataneously Breathing spontaneously on PS Not following commands Purposeful movements BUE > BLE Wound c/d/i   IMPRESSION/PLAN 69 y.o. female [facility] D#18 s/p clipping LMCA aneurysm. VDRF, ?Aspiration PNA, Acute on Chronic kidney disease, UE DVT - CT head today to monitor ventricle size with clamping of EVD, although appears relatively stable neurologically. - Finish 21d Nimotop - Vent mgmt per [employer]

## 2019-12-07 NOTE — Procedures (Signed)
Interventional Radiology Procedure:   Indications: History of SAH and needs additional nutritional support.  Procedure: Gastrostomy tube placement  Findings: 20 Fr gastrostomy tube in stomach  Complications: None     EBL: less than 10 ml  Plan: IR will evaluate tube tomorrow prior to starting tube feeds.   Hobson Lax R. Anselm Pancoast, MD  Pager: [phone]

## 2019-12-07 NOTE — Progress Notes (Addendum)
NAME:  [employer], MRN:  [id number], DOB:  November 14, 1950, LOS: 81 ADMISSION DATE:  11/19/2019, CONSULTATION DATE:  11/20/19 REFERRING MD:  Zada Finders, CHIEF COMPLAINT:  SAH    Brief History   69 year old female with PMH significant for seizures, previous CVA's, HTN, and known left MCA aneurysm who reports for sudden unresponsiveness, left-sided facial droop, and right upper extremity weakness. CT Head revealed large volume left sided SAH with 36mm midline shift.   Past Medical History  CVA (No residual deficits) HTN Seizures DM II   Significant Hospital Events   6/06 Admitted to [facility] NeuroICU 6/07 Aneurysm clipped. Acute decompensation - right frontal EVD placed. Intubated for airway protection 6/11 fever 101.3 6/12 change to pressure control 6/14 on PS wean 6/17 Trach Placed   Consults:  Neurology Neurosurgery  Procedures:  6/7 EVD >> 6/7 ETT > 6/17 6/7 Left pterional craniotomy for clipping of MCA aneurysm 6/17 Trach >>   Significant Diagnostic Tests:  6/7 CT Head > Large volume SAH overlying left cerebral hemisphere with  left to right midline shift. New hydrocephalus of lateral and third vents. SAH reflux into occipital horns of lateral vents.  6/6 CTA Neck > Patent common and internal carotids without significant stenosis. Mixed plaque within proximal left ICA. Patent vertebrals.  6/6 CTA Head > 3x2 mm left MCA bifurcation aneurysm (increased in size from April 2020). No intracranial LVO. High grade focal stenosis within inferior division mid M2 left MCA 6/9 renal u/s > Right renal cysts without complicating factors.  No hydronephrosis. 6/19 renal u/s> No hydronephrosis or shadowing stone. 2. Stable right renal upper pole cyst.  Micro Data:  6/6 SARS CoV2 > Negative 6/8 resp >  Normal oral flora 6/17 resp > Few Staph Epidermidis > resistant to erythromycin, oxacillin, and Bactrim 6/19 blood cultures 1>   Antimicrobials:  6/7 Ancef x1 6/7 Rocephin > 6/10 Ancef 6/18 x  1 6/18 vancomycin 6/20 6/20 Zyvox 6/22  Interim history/subjective:  No acute events overnight, patient is scheduled for PEG tube placement today 6/24.  Objective   Blood pressure (!) 150/69, pulse 76, temperature 99.5 F (37.5 C), temperature source Oral, resp. rate 15, height 5\' 6"  (1.676 m), weight 78 kg, SpO2 98 %.    Vent Mode: PCV FiO2 (%):  [40 %] 40 % Set Rate:  [15 bmp] 15 bmp PEEP:  [5 cmH20] 5 cmH20 Pressure Support:  [12 cmH20] 12 cmH20 Plateau Pressure:  [17 cmH20-18 cmH20] 18 cmH20   Intake/Output Summary (Last 24 hours) at 12/07/2019 4174 Last data filed at 12/06/2019 1900 Gross per 24 hour  Intake 495 ml  Output 45 ml  Net 450 ml   Filed Weights   11/28/19 0500 11/29/19 0500 12/02/19 0436  Weight: 79.8 kg 79.6 kg 78 kg    Examination: General: Chronic ill-appearing elderly deconditioned female lying in bed in no acute distress HEENT: 6 cuffed trach midline, no secretions, MM pink/moist, PERRL, sclera nonicteric Neuro: Open eyes spontaneously with spontaneous movement in upper extremity seen, unable to follow any commands CV: s1s2 regular rate and rhythm, no murmur, rubs, or gallops,  PULM: Clear to auscultation bilaterally, no added breath sounds, no increased work of breathing, tolerating SBT well GI: soft, bowel sounds active in all 4 quadrants, non-tender, non-distended, tube feeds currently on hold in anticipation for PEG tube placement Extremities: warm/dry, no edema  Skin: no rashes or lesions  Resolved Hospital Problem list   Hyponatremia Hyperchloremia Hyperkalemia Hyperphosphatemia  Assessment &  Plan:   Acute hypoxic respiratory failure with compromised airway in setting of [place].  -S/P Trach 6/17  Plan: Continues to tolerate SBT trials, will trial transition to ATC today Wean PEEP and FiO2 for sats greater than 90% Routine trach care Follow intermittent chest x-ray and ABGs Encourage frequent pulmonary hygiene  Acute left-sided SAH with  hydrocephalus,  Likely ruptured left MCA aneurysm. -EVD placed 6/7 -Clipping 6/7 Hx of seizures. Plan: Management per neurology Neuroprotective measures including eurothermia, euglycemia, eunatermia, and normoxia Nutrition and bowel regimens Seizure precautions EVD clamped per neurosurgery 6/23  Concern for Asp PNA -Sputum 6/17 with Staph Epidermidis     -PCT 1.04 > 1.44 -CXR with no acute findings  -Fever spike of 101.5 afternoon of 6/23 Plan  Status post 5 days of vancomycin and Zyvox Continue to trend WBC and fever curve Repeat blood cultures remain negative   RUE DVT  -Bilateral upper extremity Doppler positive for left axillary deep vein thrombosis , bilateral lower extremity Dopplers negative  Plan: No role for IVC filter  On anticoagulation in the setting of recent [facility] and continued anemia, defer to neurology for initiation   Anemia -S/p 1 unit of PRBC 6/22 Plan: Trend CBC Transfuse per protocol  Hemoglobin goal greater then 7  Acute on CKD3, improving off vanc  -Baseline creatinine 1.25, creatinine peaked at 2.13 on 6/20 -Patient not a candidate for dialysis Non gap metabolic acidosis, improving Plan: Nephrology consulted during admission, signed off 6/21 Follow renal function / urine output Trend Bmet Avoid nephrotoxins, ensure adequate renal perfusion  Enteral  hydration  HTN. Plan Avoid hypotension Home Norvasc and Hydralazine on hold due to episode of hypotension   Hypoglycemic Events  DM II Plan: Continue SSI Continue Levemir CBG checks q4hs  Best practice:  Diet: Tube feeds Pain/Anxiety/Delirium protocol (if indicated): na VAP protocol if indicated  DVT prophylaxis: Add Lovenox when OK with NSG GI prophylaxis: pepcid  Glucose control: SSI, Levemir Mobility: Bed rest Code Status: Full code Family Communication: Will updated on plan of care.  Disposition: Continue ICU Care.   Labs   CBC: Recent Labs  Lab 12/02/19 0002  12/02/19 1128 12/03/19 0329 12/03/19 0329 12/04/19 0445 12/04/19 0857 12/05/19 0300 12/05/19 0510 12/06/19 0935  WBC 15.7*  --  14.1*  --  9.6  --   --  9.0 8.9  HGB 8.1*   < > 8.3*   < > 7.4* 7.5* 6.5* 6.8* 7.9*  HCT 26.9*   < > 28.0*   < > 23.9* 22.0* 19.0* 21.8* 25.4*  MCV 100.4*  --  101.8*  --  99.2  --   --  97.8 96.9  PLT 491*  --  572*  --  524*  --   --  414* 488*   < > = values in this interval not displayed.    Basic Metabolic Panel: Recent Labs  Lab 12/01/19 0607 12/01/19 0607 12/02/19 0002 12/02/19 1128 12/03/19 0329 12/03/19 0329 12/03/19 1548 12/03/19 1548 12/04/19 0445 12/04/19 0445 12/04/19 0857 12/04/19 1809 12/05/19 0300 12/05/19 0510 12/06/19 0935  NA 150*   < > 152*   < > 150*   < > 149*   < > 146*   < > 144 141 141 138 143  K 4.7   < > 5.0   < > 5.4*   < > 5.1   < > 4.5   < > 4.8 4.6 4.5 4.7 4.4  CL 126*   < > 128*   < >  121*   < > 120*  --  117*  --   --  113*  --  109 112*  CO2 15*   < > 13*   < > 18*   < > 19*  --  19*  --   --  18*  --  19* 20*  GLUCOSE 172*   < > 130*   < > 74   < > 114*  --  71  --   --  197*  --  251* 185*  BUN 69*   < > 69*   < > 76*   < > 85*  --  85*  --   --  84*  --  77* 71*  CREATININE 1.68*   < > 1.71*   < > 1.94*   < > 2.13*  --  1.85*  --   --  1.74*  --  1.62* 1.51*  CALCIUM 9.5   < > 9.6   < > 9.6   < > 9.7  --  9.4  --   --  9.0  --  9.0 9.3  MG 2.7*  --  2.6*  --  2.7*  --   --   --  2.6*  --   --   --   --  2.4  --   PHOS 5.1*  --  4.5  --  7.2*  --   --   --  5.8*  --   --   --   --  3.4  --    < > = values in this interval not displayed.   GFR: Estimated Creatinine Clearance: 36.6 mL/min (A) (by C-G formula based on SCr of 1.51 mg/dL (H)). Recent Labs  Lab 12/02/19 0002 12/02/19 0002 12/03/19 0329 12/04/19 0445 12/05/19 0510 12/06/19 0935  PROCALCITON 1.04  --  1.44  --   --   --   WBC 15.7*   < > 14.1* 9.6 9.0 8.9   < > = values in this interval not displayed.    Liver Function Tests: Recent  Labs  Lab 12/01/19 0607 12/02/19 0002  AST 18 21  ALT 32 30  ALKPHOS 123 123  BILITOT 0.4 0.5  PROT 6.2* 6.3*  ALBUMIN 2.3* 2.4*   No results for input(s): LIPASE, AMYLASE in the last 168 hours. No results for input(s): AMMONIA in the last 168 hours.  ABG    Component Value Date/Time   PHART 7.442 12/05/2019 0300   PCO2ART 32.3 12/05/2019 0300   PO2ART 109 (H) 12/05/2019 0300   HCO3 21.8 12/05/2019 0300   TCO2 23 12/05/2019 0300   ACIDBASEDEF 2.0 12/05/2019 0300   O2SAT 98.0 12/05/2019 0300     Coagulation Profile: No results for input(s): INR, PROTIME in the last 168 hours.  Cardiac Enzymes: No results for input(s): CKTOTAL, CKMB, CKMBINDEX, TROPONINI in the last 168 hours.  HbA1C: Hgb A1c MFr Bld  Date/Time Value Ref Range Status  11/21/2019 10:55 AM 7.8 (H) 4.8 - 5.6 % Final    Comment:    (NOTE) Pre diabetes:          5.7%-6.4% Diabetes:              >6.4% Glycemic control for   <7.0% adults with diabetes     CBG: Recent Labs  Lab 12/06/19 1937 12/06/19 2355 12/07/19 0342 12/07/19 0424 12/07/19 0449  GLUCAP 120* 171* 39* 69* 91       Critical care time:  Performed by: Johnsie Cancel  Total critical care time: 38 minutes  Critical care time was exclusive of separately billable procedures and treating other patients.  Critical care was necessary to treat or prevent imminent or life-threatening deterioration.  Critical care was time spent personally by me on the following activities: development of treatment plan with patient and/or surrogate as well as nursing, discussions with consultants, evaluation of patient's response to treatment, examination of patient, obtaining history from patient or surrogate, ordering and performing treatments and interventions, ordering and review of laboratory studies, ordering and review of radiographic studies, pulse oximetry and re-evaluation of patient's condition.  Johnsie Cancel, NP-C [facility] Contact / Pager information can be found on Amion  12/07/2019, 8:11 AM   PCCM:  69 yo, s/p SAH, hydro, s/p EVD. Chronic resp failure. Now tolerating TCT. Plans for PEG today   BP (!) 185/73   Pulse 95   Temp 98.6 F (37 C) (Axillary)   Resp (!) 29   Ht 5\' 6"  (1.676 m)   Wt 78 kg   SpO2 100%   BMI 27.75 kg/m   Gen: chronically ill, laying in bed on vent  HENT: tracking, EVD in place  Heart: RRR s1 s2 Lungs: CTAB, comfortable on vent   Labs reviewed:   A:  Chronic hypoxemic respiratory failure  S/p Trach, on TCT today  Acute left SAH with hydro s/p EVD  RUE DVT, unable to start [facility] due to [facility]   P: Weaning from vent  TCT today  Adult vent protocol  PEG today  Observe off abx  evd is clamped? I presumed consideration for removal per neuroSx   This patient is critically ill with multiple organ system failure; which, requires frequent high complexity decision making, assessment, support, evaluation, and titration of therapies. This was completed through the application of advanced monitoring technologies and extensive interpretation of multiple databases. During this encounter critical care time was devoted to patient care services described in this note for 32 minutes.  Garner Nash, DO [facility] 12/07/2019 2:21 PM

## 2019-12-07 NOTE — Progress Notes (Signed)
PT Cancellation Note  Patient Details Name: Chardonnay Holzmann Husak MRN: [id number] DOB: [birth year]   Cancelled Treatment:    Reason Eval/Treat Not Completed: Patient at procedure or test/unavailable. Pt off floor for procedure. She is scheduled for PEG placement and CT today.  PT to re-attempt as time allows.   Lorriane Shire 12/07/2019, 12:22 PM   Lorrin Goodell, PT  Office # [phone] Pager [phone]

## 2019-12-07 NOTE — Progress Notes (Signed)
CRITICAL VALUE ALERT  Critical Value: CBG=39 Date & Time Notied:  0350    Orders Received/Actions taken:Half amp of D50 given.CBG normalized:   Ref. Range 12/07/2019 03:42 12/07/2019 04:24 12/07/2019 04:49  Glucose-Capillary Latest Ref Range: 70 - 99 mg/dL 39 (LL) 69 (L) 91

## 2019-12-07 NOTE — Progress Notes (Addendum)
Nutrition Follow-up  DOCUMENTATION CODES:   Not applicable  INTERVENTION:  Once PEG is ready for use:  Jevity 1.2 @ 50 ml/h (1200 ml per day) Pro-stat 60 ml BID  Provides 1840 kcal, 126 gm protein, 968 ml free water daily  200 ml free water every 6 hours  Total free water: 1768 ml   NUTRITION DIAGNOSIS:   Inadequate oral intake related to inability to eat as evidenced by NPO status.  Ongoing.  GOAL:   Provide needs based on ASPEN/SCCM guidelines  Met with TF.   MONITOR:   TF tolerance, Vent status, Labs  REASON FOR ASSESSMENT:   Consult, Ventilator Enteral/tube feeding initiation and management  ASSESSMENT:   Pt with PMH of seizures, previous CVA's, HTN, DM, and known L MCA aneurysm who was admitted with large L SAH from ruptured L MCA aneurysm.  6/7 s/p crani for clipping and EVD placement  6/14 cortrak placed 6/17 trach 6/23 EVD clamped  Discussed pt with RN. Pt now on trach collar. Plans for PEG placement today.   TF: Jevity 1.2 cal @ 95m/hr, 655mPro-stat BID  Labs: CBGs 39-69-91 Medications: Pepcid, Novolog 0-9 Units Q4H, Levemir 12 units, MVI  Diet Order:   Diet Order            Diet NPO time specified  Diet effective midnight                 EDUCATION NEEDS:   No education needs have been identified at this time  Skin:  Skin Assessment: Skin Integrity Issues: Skin Integrity Issues:: Incisions Incisions: head x2  Last BM:  6/23 via rectal tube  Height:   Ht Readings from Last 1 Encounters:  11/19/19 _0  (1.676 m)    Weight:   Wt Readings from Last 1 Encounters:  12/02/19 78 kg    Ideal Body Weight:  59 kg  BMI:  Body mass index is 27.75 kg/m.  Estimated Nutritional Needs:   Kcal:  1800-2000  Protein:  110-125 grams  Fluid:  2 L/day    AmLarkin InaMS, RD, LDN RD pager number and weekend/on-call pager number located in [email or web address]

## 2019-12-08 ENCOUNTER — Inpatient Hospital Stay (HOSPITAL_COMMUNITY): Payer: Medicare Other

## 2019-12-08 DIAGNOSIS — I6012 Nontraumatic subarachnoid hemorrhage from left middle cerebral artery: Secondary | ICD-10-CM | POA: Diagnosis not present

## 2019-12-08 DIAGNOSIS — I609 Nontraumatic subarachnoid hemorrhage, unspecified: Secondary | ICD-10-CM | POA: Diagnosis not present

## 2019-12-08 LAB — CBC
HCT: 26.7 % — ABNORMAL LOW (ref 36.0–46.0)
Hemoglobin: 8.4 g/dL — ABNORMAL LOW (ref 12.0–15.0)
MCH: 30.4 pg (ref 26.0–34.0)
MCHC: 31.5 g/dL (ref 30.0–36.0)
MCV: 96.7 fL (ref 80.0–100.0)
Platelets: 485 10*3/uL — ABNORMAL HIGH (ref 150–400)
RBC: 2.76 MIL/uL — ABNORMAL LOW (ref 3.87–5.11)
RDW: 15 % (ref 11.5–15.5)
WBC: 10.4 10*3/uL (ref 4.0–10.5)
nRBC: 0 % (ref 0.0–0.2)

## 2019-12-08 LAB — BASIC METABOLIC PANEL
Anion gap: 11 (ref 5–15)
BUN: 56 mg/dL — ABNORMAL HIGH (ref 8–23)
CO2: 14 mmol/L — ABNORMAL LOW (ref 22–32)
Calcium: 9.5 mg/dL (ref 8.9–10.3)
Chloride: 117 mmol/L — ABNORMAL HIGH (ref 98–111)
Creatinine, Ser: 1.36 mg/dL — ABNORMAL HIGH (ref 0.44–1.00)
GFR calc Af Amer: 46 mL/min — ABNORMAL LOW (ref >60)
GFR calc non Af Amer: 39 mL/min — ABNORMAL LOW (ref >60)
Glucose, Bld: 251 mg/dL — ABNORMAL HIGH (ref 70–99)
Potassium: 4.5 mmol/L (ref 3.5–5.1)
Sodium: 142 mmol/L (ref 135–145)

## 2019-12-08 LAB — GLUCOSE, CAPILLARY
Glucose-Capillary: 201 mg/dL — ABNORMAL HIGH (ref 70–99)
Glucose-Capillary: 198 mg/dL — ABNORMAL HIGH (ref 70–99)
Glucose-Capillary: 209 mg/dL — ABNORMAL HIGH (ref 70–99)

## 2019-12-08 MED ORDER — FUROSEMIDE 10 MG/ML IJ SOLN
40.0000 mg | INTRAMUSCULAR | Status: AC
  Administered 2019-12-08 (×2): 40 mg via INTRAVENOUS
  Filled 2019-12-08: qty 4

## 2019-12-08 NOTE — TOC Initial Note (Addendum)
Transition of Care [facility]) - Initial/Assessment Note    Patient Details  Name: Rebecca Green MRN: [id number] Date of Birth: [birth year]  Transition [facility]) CM/SW Contact:    Ella Bodo, RN Phone Number: 12/08/2019, 2:44 PM  Clinical Narrative:  CM consult for LTAC hospital:  Notified by MD that pt ready for transition to [place].   Spoke with pt's daughter, Audelia Acton; offered choice of both area LTACs, [facility].  She states she would like to consider both facilities, talk with the rest of the family and speak with LTAC admissions liaisons prior to making a decision.  Referrals made to both [facility].  Admission coordinators plan to speak with pt's daughter; will follow up with family on decision.     Medicare # [id number]               Expected Discharge Plan: Long Term Acute Care (LTAC) Barriers to Discharge: Continued Medical Work up   Patient Goals and CMS Choice   CMS Medicare.gov Compare Post Acute Care list provided to:: Patient Represenative (must comment) (daughter Audelia Acton) Choice offered to / list presented to : Adult Children  Expected Discharge Plan and Services Expected Discharge Plan: Long Term Acute Care (LTAC)   Discharge Planning Services: CM Consult Post Acute Care Choice: Long Term Acute Care (LTAC) Living arrangements for the past 2 months: Single Family Home                                      Prior Living Arrangements/Services Living arrangements for the past 2 months: Single Family Home Lives with:: Self Patient language and need for interpreter reviewed:: Yes        Need for Family Participation in Patient Care: Yes (Comment)     Criminal Activity/Legal Involvement Pertinent to Current Situation/Hospitalization: No - Comment as needed  Activities of Daily Living Home Assistive Devices/Equipment: None ADL Screening (condition at time of admission) Patient's cognitive ability adequate  to safely complete daily activities?: No Is the patient deaf or have difficulty hearing?: No Does the patient have difficulty seeing, even when wearing glasses/contacts?: No Does the patient have difficulty concentrating, remembering, or making decisions?: Yes Patient able to express need for assistance with ADLs?: No Does the patient have difficulty dressing or bathing?: Yes Independently performs ADLs?: No Communication: Dependent Is this a change from baseline?: Change from baseline, expected to last >3 days Dressing (OT): Dependent Is this a change from baseline?: Change from baseline, expected to last >3 days Grooming: Dependent Is this a change from baseline?: Change from baseline, expected to last >3 days Feeding: Dependent Is this a change from baseline?: Change from baseline, expected to last >3 days Bathing: Dependent Is this a change from baseline?: Change from baseline, expected to last >3 days Toileting: Dependent Is this a change from baseline?: Change from baseline, expected to last >3days In/Out Bed: Dependent Is this a change from baseline?: Change from baseline, expected to last >3 days Walks in Home: Dependent Is this a change from baseline?: Change from baseline, expected to last >3 days Does the patient have difficulty walking or climbing stairs?: Yes Weakness of Legs: Both Weakness of Arms/Hands: Both               Emotional Assessment   Attitude/Demeanor/Rapport: Unable to Assess Affect (typically observed): Unable to Assess  Admission diagnosis:  SAH (subarachnoid hemorrhage) ([place]) [I60.9] Aneurysmal subarachnoid hemorrhage ([place]) [I60.8] Patient Active Problem List   Diagnosis Date Noted  . AKI (acute kidney injury) ([place])   . SAH (subarachnoid hemorrhage) ([place])   . Status post tracheostomy ([place])   . Acute respiratory failure ([place])   . Aneurysmal subarachnoid hemorrhage ([place]) 11/19/2019   PCP:  Lowella Dandy, NP Pharmacy:   [facility]  [facility] [place], [address] AT [place] [address] [place] [address] Phone: [phone] Fax: [phone]     Social Determinants of Health (SDOH) Interventions    Readmission Risk Interventions No flowsheet data found.  Reinaldo Raddle, RN, BSN  Trauma/Neuro ICU Case Manager [phone]

## 2019-12-08 NOTE — Progress Notes (Addendum)
NAME:  Rebecca Green, MRN:  [id number], DOB:  11/04/1950, LOS: 23 ADMISSION DATE:  11/19/2019, CONSULTATION DATE:  11/20/19 REFERRING MD:  Zada Finders, CHIEF COMPLAINT:  SAH    Brief History   69 year old female with PMH significant for seizures, previous CVA's, HTN, and known left MCA aneurysm who reports for sudden unresponsiveness, left-sided facial droop, and right upper extremity weakness. CT Head revealed large volume left sided SAH with 3mm midline shift.   Past Medical History  CVA (No residual deficits) HTN Seizures DM II   Significant Hospital Events   6/06 [facility] 6/07 Aneurysm clipped. Acute decompensation - right frontal EVD placed. Intubated for airway protection 6/11 fever 101.3 6/12 change to pressure control 6/14 on PS wean 6/17 Trach Placed   Consults:  Neurology Neurosurgery  Procedures:  6/7 EVD >> 6/7 ETT > 6/17 6/7 Left pterional craniotomy for clipping of MCA aneurysm 6/17 Trach >>   Significant Diagnostic Tests:  6/7 CT Head > Large volume SAH overlying left cerebral hemisphere with  left to right midline shift. New hydrocephalus of lateral and third vents. SAH reflux into occipital horns of lateral vents.  6/6 CTA Neck > Patent common and internal carotids without significant stenosis. Mixed plaque within proximal left ICA. Patent vertebrals.  6/6 CTA Head > 3x2 mm left MCA bifurcation aneurysm (increased in size from April 2020). No intracranial LVO. High grade focal stenosis within inferior division mid M2 left MCA 6/9 renal u/s > Right renal cysts without complicating factors.  No hydronephrosis. 6/19 renal u/s> No hydronephrosis or shadowing stone. 2. Stable right renal upper pole cyst.  Micro Data:  6/6 SARS CoV2 > Negative 6/8 resp >  Normal oral flora 6/17 resp > Few Staph Epidermidis > resistant to erythromycin, oxacillin, and Bactrim 6/19 blood cultures 1>   Antimicrobials:  6/7 Ancef x1 6/7 Rocephin > 6/10 Ancef 6/18 x  1 6/18 vancomycin 6/20 6/20 Zyvox 6/22  Interim history/subjective:  Tolerated ATC from 0800 to 0400 at which time she became fatigued with hypoxia requiring resumption of ventilator.   Objective   Blood pressure (!) 147/69, pulse 91, temperature 100.2 F (37.9 C), temperature source Axillary, resp. rate 20, height 5\' 6"  (1.676 m), weight 78.9 kg, SpO2 98 %.    Vent Mode: PCV FiO2 (%):  [35 %-60 %] 40 % Set Rate:  [15 bmp] 15 bmp PEEP:  [5 cmH20] 5 cmH20 Plateau Pressure:  [18 cmH20] 18 cmH20   Intake/Output Summary (Last 24 hours) at 12/08/2019 1025 Last data filed at 12/08/2019 1000 Gross per 24 hour  Intake 1177.5 ml  Output 1350 ml  Net -172.5 ml   Filed Weights   11/29/19 0500 12/02/19 0436 12/08/19 0500  Weight: 79.6 kg 78 kg 78.9 kg    Examination: General: Chronically ill appearing deconditioned elderly female on mechanical ventilation through trach, in NAD HEENT: 6 cuffed trach midline, MM pink/moist, PERRL, sclera non-icteric, EVD in place  Neuro: Will open eyes to verbal stimuli, remains unable to follow commands CV: s1s2 regular rate and rhythm, no murmur, rubs, or gallops,  PULM:  Slight rhonchi bilaterally, tolerating vent well, goal to transition back to ATC this am GI: soft, bowel sounds active in all 4 quadrants, non-tender, non-distended, tolerating TF Extremities: warm/dry, generalized edema  Skin: no rashes or lesions  Resolved Hospital Problem list   Hyponatremia Hyperchloremia Hyperkalemia Hyperphosphatemia  Assessment & Plan:   Acute hypoxic respiratory failure with compromised airway in setting  of [place].  -S/P Trach 6/17  Plan: Goal for ATC as patient can tolerate Routine trach care  Follow intermittent CXR and ABG Encourage frequent pulmonary hygiene   Acute left-sided SAH with hydrocephalus,  Likely ruptured left MCA aneurysm. -EVD placed 6/7 -Clipping 6/7 Hx of seizures. Plan: Management per neurology  Maintain neuro protective  measures; goal for eurothermia, euglycemia, eunatermia, normoxia, and PCO2 goal of 35-40 Nutrition and bowel regiment  Seizure precautions  EVD per oneirology   Concern for Asp PNA -Sputum 6/17 with Staph Epidermidis     -PCT 1.04 > 1.44 -CXR with no acute findings  -Fever spike of 101.4 morning of 6/24, Status post 5 days of vancomycin/Zyvox Plan  Continue to observe off antibiotics Trend WBC and fever curve   RUE DVT  -Bilateral upper extremity Doppler positive for left axillary deep vein thrombosis , bilateral lower extremity Dopplers negative  -No role for IVC filter  Plan: Unable to anticoagulate given SAH and continued anemia  Defer to neurology for initiation   Anemia -S/p 1 unit of PRBC 6/22 Plan: Hemoglobin remains stable, goal greater than 7 Trend CBC  Acute on CKD3, improving off vanc  -Baseline creatinine 1.25, creatinine peaked at 2.13 on 6/20 -Patient not a candidate for dialysis -Nephrology consulted during admission, signed off 1/61 Non gap metabolic acidosis, improving Plan: Follow renal function / urine output Trend Bmet Avoid nephrotoxins, ensure adequate renal perfusion  Enteral hydration   HTN. -Home Norvasc and Hydralazine on hold due to episode of hypotension  Plan Avoid hypotension   Hypoglycemic Events  DM II Plan: Continue SSI and long acting insulin  CBG q4  Best practice:  Diet: Tube feeds Pain/Anxiety/Delirium protocol (if indicated): na VAP protocol if indicated  DVT prophylaxis: Add Lovenox when OK with NSG GI prophylaxis: pepcid  Glucose control: SSI, Levemir Mobility: Bed rest Code Status: Full code Family Communication: Will updated on plan of care.  Disposition: Continue ICU Care.   Labs   CBC: Recent Labs  Lab 12/03/19 0329 12/03/19 0329 12/04/19 0445 12/04/19 0445 12/04/19 0857 12/05/19 0300 12/05/19 0510 12/06/19 0935 12/08/19 0435  WBC 14.1*  --  9.6  --   --   --  9.0 8.9 10.4  HGB 8.3*   < > 7.4*   < >  7.5* 6.5* 6.8* 7.9* 8.4*  HCT 28.0*   < > 23.9*   < > 22.0* 19.0* 21.8* 25.4* 26.7*  MCV 101.8*  --  99.2  --   --   --  97.8 96.9 96.7  PLT 572*  --  524*  --   --   --  414* 488* 485*   < > = values in this interval not displayed.    Basic Metabolic Panel: Recent Labs  Lab 12/02/19 0002 12/02/19 1128 12/03/19 0329 12/03/19 1548 12/04/19 0445 12/04/19 0857 12/04/19 1809 12/05/19 0300 12/05/19 0510 12/06/19 0935 12/08/19 0435  NA 152*   < > 150*   < > 146*   < > 141 141 138 143 142  K 5.0   < > 5.4*   < > 4.5   < > 4.6 4.5 4.7 4.4 4.5  CL 128*   < > 121*   < > 117*  --  113*  --  109 112* 117*  CO2 13*   < > 18*   < > 19*  --  18*  --  19* 20* 14*  GLUCOSE 130*   < > 74   < >  71  --  197*  --  251* 185* 251*  BUN 69*   < > 76*   < > 85*  --  84*  --  77* 71* 56*  CREATININE 1.71*   < > 1.94*   < > 1.85*  --  1.74*  --  1.62* 1.51* 1.36*  CALCIUM 9.6   < > 9.6   < > 9.4  --  9.0  --  9.0 9.3 9.5  MG 2.6*  --  2.7*  --  2.6*  --   --   --  2.4  --   --   PHOS 4.5  --  7.2*  --  5.8*  --   --   --  3.4  --   --    < > = values in this interval not displayed.   GFR: Estimated Creatinine Clearance: 40.8 mL/min (A) (by C-G formula based on SCr of 1.36 mg/dL (H)). Recent Labs  Lab 12/02/19 0002 12/02/19 0002 12/03/19 0329 12/03/19 0329 12/04/19 0445 12/05/19 0510 12/06/19 0935 12/08/19 0435  PROCALCITON 1.04  --  1.44  --   --   --   --   --   WBC 15.7*   < > 14.1*   < > 9.6 9.0 8.9 10.4   < > = values in this interval not displayed.    Liver Function Tests: Recent Labs  Lab 12/02/19 0002  AST 21  ALT 30  ALKPHOS 123  BILITOT 0.5  PROT 6.3*  ALBUMIN 2.4*   No results for input(s): LIPASE, AMYLASE in the last 168 hours. No results for input(s): AMMONIA in the last 168 hours.  ABG    Component Value Date/Time   PHART 7.442 12/05/2019 0300   PCO2ART 32.3 12/05/2019 0300   PO2ART 109 (H) 12/05/2019 0300   HCO3 21.8 12/05/2019 0300   TCO2 23 12/05/2019 0300     ACIDBASEDEF 2.0 12/05/2019 0300   O2SAT 98.0 12/05/2019 0300     Coagulation Profile: No results for input(s): INR, PROTIME in the last 168 hours.  Cardiac Enzymes: No results for input(s): CKTOTAL, CKMB, CKMBINDEX, TROPONINI in the last 168 hours.  HbA1C: Hgb A1c MFr Bld  Date/Time Value Ref Range Status  11/21/2019 10:55 AM 7.8 (H) 4.8 - 5.6 % Final    Comment:    (NOTE) Pre diabetes:          5.7%-6.4% Diabetes:              >6.4% Glycemic control for   <7.0% adults with diabetes     CBG: Recent Labs  Lab 12/07/19 0830 12/07/19 1550 12/07/19 2016 12/07/19 2315 12/08/19 0403  GLUCAP 117* 205* 129* 201* 198*       Critical care time:    Performed by: Johnsie Cancel  Total critical care time: 37 minutes  Critical care time was exclusive of separately billable procedures and treating other patients.  Critical care was necessary to treat or prevent imminent or life-threatening deterioration.  Critical care was time spent personally by me on the following activities: development of treatment plan with patient and/or surrogate as well as nursing, discussions with consultants, evaluation of patient's response to treatment, examination of patient, obtaining history from patient or surrogate, ordering and performing treatments and interventions, ordering and review of laboratory studies, ordering and review of radiographic studies, pulse oximetry and re-evaluation of patient's condition.  Johnsie Cancel, NP-C [facility] Contact / Pager information can be  found on Amion  12/08/2019, 10:25 AM   69 yo FM, sah with hydro s/p EVD. EVD clamped. Plans to pull today. Had to go back on vent after being on TCT for a long time.   BP (!) 143/55   Pulse (!) 101   Temp (!) 100.8 F (38.2 C) (Axillary)   Resp (!) 38   Ht 5\' 6"  (1.676 m)   Wt 78.9 kg   SpO2 96%   BMI 28.08 kg/m   Gen: elderly fm, debilitated  Neck: trach in place  Heart: RRR, s1  s2  Lungs: BL vented breaths  Labs reviewed   A:  SAH s/p hydro s/p evd, planning to pull per neurosx  Chronic resp failure 2/2 to above RUE DVT not able to start [facility] 2/2 above   P: TCT again today if tolerated  EVD removed Needs LTACH  Likely will need prolonged vent wean   This patient is critically ill with multiple organ system failure; which, requires frequent high complexity decision making, assessment, support, evaluation, and titration of therapies. This was completed through the application of advanced monitoring technologies and extensive interpretation of multiple databases. During this encounter critical care time was devoted to patient care services described in this note for 31 minutes.  Garner Nash, DO [facility] 12/08/2019 12:46 PM

## 2019-12-08 NOTE — Progress Notes (Signed)
  NEUROSURGERY PROGRESS NOTE   No issues overnight.   EXAM:  BP 137/69   Pulse 71   Temp (!) 101.4 F (38.6 C) (Axillary) Comment: nurse notified  Resp (!) 22   Ht 5\' 6"  (1.676 m)   Wt 78.9 kg   SpO2 100%   BMI 28.08 kg/m   Opens eyes sponataneously On trach collar Wiggles right toes on command intermittently Purposeful movements BUE > BLE Wound c/d/i  IMPRESSION/PLAN 69 y.o. female SAH D#19 s/p clipping LMCA aneurysm. Unchanged neurologically VDRF, ?Aspiration PNA, Acute on Chronic kidney disease, UE DVT - repeat head CT again today for monitoring. If stable, will d/c EVD - Finish 21d Nimotop - Vent mgmt per PCCM

## 2019-12-08 NOTE — Progress Notes (Signed)
S/p perc G-tube yesterday. No complications Already in use for TF. BP (!) 147/69   Pulse 91   Temp 100.2 F (37.9 C) (Axillary)   Resp 20   Ht 5\' 6"  (1.676 m)   Wt 78.9 kg   SpO2 98%   BMI 28.08 kg/m  G-tube intact, site clean, NT, no leakage or bleeding. Call IR if needed   Ascencion Dike PA-C Interventional Radiology 12/08/2019 10:32 AM

## 2019-12-08 NOTE — Progress Notes (Signed)
Care taken over at 1500.

## 2019-12-08 NOTE — Progress Notes (Signed)
PT Cancellation Note  Patient Details Name: Mahira Petras Hillery MRN: [id number] DOB: [birth year]   Cancelled Treatment:    Reason Eval/Treat Not Completed: Other (comment). Per RN report pt unable to follow commands at this time. PT will follow up at a later time.   Zenaida Niece 12/08/2019, 5:21 PM

## 2019-12-09 DIAGNOSIS — I6012 Nontraumatic subarachnoid hemorrhage from left middle cerebral artery: Secondary | ICD-10-CM | POA: Diagnosis not present

## 2019-12-09 DIAGNOSIS — I609 Nontraumatic subarachnoid hemorrhage, unspecified: Secondary | ICD-10-CM | POA: Diagnosis not present

## 2019-12-09 DIAGNOSIS — Z978 Presence of other specified devices: Secondary | ICD-10-CM

## 2019-12-09 LAB — GLUCOSE, CAPILLARY
Glucose-Capillary: 276 mg/dL — ABNORMAL HIGH (ref 70–99)
Glucose-Capillary: 171 mg/dL — ABNORMAL HIGH (ref 70–99)
Glucose-Capillary: 126 mg/dL — ABNORMAL HIGH (ref 70–99)
Glucose-Capillary: 114 mg/dL — ABNORMAL HIGH (ref 70–99)
Glucose-Capillary: 248 mg/dL — ABNORMAL HIGH (ref 70–99)
Glucose-Capillary: 212 mg/dL — ABNORMAL HIGH (ref 70–99)
Glucose-Capillary: 211 mg/dL — ABNORMAL HIGH (ref 70–99)
Glucose-Capillary: 202 mg/dL — ABNORMAL HIGH (ref 70–99)
Glucose-Capillary: 177 mg/dL — ABNORMAL HIGH (ref 70–99)
Glucose-Capillary: 189 mg/dL — ABNORMAL HIGH (ref 70–99)

## 2019-12-09 NOTE — Progress Notes (Signed)
Pt's daughter Eupha Lobb updated on plan of care over phone.

## 2019-12-09 NOTE — Progress Notes (Signed)
Patient ID: [place], female   DOB: Dec 28, 1950, 69 y.o.   MRN: [id number] Patient's vital signs are stable She has not had any drainage from her ventriculostomy which has been clamped now for 3 days Ventriculostomy was removed without incident singular staple placed and exit wound.  Wound cleansed with Betadine before and after Level of alertness is minimal to even deep pain Continue supportive care

## 2019-12-09 NOTE — Progress Notes (Signed)
Pt placed back on ventilator with PS ventilation due to increased WOB on ATC. Pt looks more comfortable on PS 12/5. RN notified.

## 2019-12-09 NOTE — Progress Notes (Signed)
EVD removed by Dr Ellene Route.

## 2019-12-09 NOTE — Progress Notes (Signed)
NAME:  Pease, MRN:  [id number], DOB:  December 01, 1950, LOS: 38 ADMISSION DATE:  11/19/2019, CONSULTATION DATE:  11/20/19 REFERRING MD:  Zada Finders, CHIEF COMPLAINT:  SAH    Brief History   69 year old female with PMH significant for seizures, previous CVA's, HTN, and known left MCA aneurysm who reports for sudden unresponsiveness, left-sided facial droop, and right upper extremity weakness. CT Head revealed large volume left sided SAH with 75mm midline shift.   Past Medical History  CVA (No residual deficits) HTN Seizures DM II   Significant Hospital Events   6/06 Admitted to [facility]ICU 6/07 Aneurysm clipped. Acute decompensation - right frontal EVD placed. Intubated for airway protection 6/11 fever 101.3 6/12 change to pressure control 6/14 on PS wean 6/17 Trach Placed   Consults:  Neurology Neurosurgery  Procedures:  6/7 EVD >> 6/7 ETT > 6/17 6/7 Left pterional craniotomy for clipping of MCA aneurysm 6/17 Trach >>   Significant Diagnostic Tests:  6/7 CT Head > Large volume SAH overlying left cerebral hemisphere with  left to right midline shift. New hydrocephalus of lateral and third vents. SAH reflux into occipital horns of lateral vents.  6/6 CTA Neck > Patent common and internal carotids without significant stenosis. Mixed plaque within proximal left ICA. Patent vertebrals.  6/6 CTA Head > 3x2 mm left MCA bifurcation aneurysm (increased in size from April 2020). No intracranial LVO. High grade focal stenosis within inferior division mid M2 left MCA 6/9 renal u/s > Right renal cysts without complicating factors.  No hydronephrosis. 6/19 renal u/s> No hydronephrosis or shadowing stone. 2. Stable right renal upper pole cyst.  Micro Data:  6/6 SARS CoV2 > Negative 6/8 resp >  Normal oral flora 6/17 resp > Few Staph Epidermidis > resistant to erythromycin, oxacillin, and Bactrim 6/19 blood cultures 1>   Antimicrobials:  6/7 Ancef x1 6/7 Rocephin > 6/10 Ancef 6/18 x  1 6/18 vancomycin 6/20 6/20 Zyvox 6/22  Interim history/subjective:  No acute events or changes o/n, tolerating ATC this a.m.  Objective   Blood pressure (!) 113/54, pulse 69, temperature 99 F (37.2 C), temperature source Axillary, resp. rate (!) 25, height 5\' 6"  (1.676 m), weight 79.1 kg, SpO2 93 %.    Vent Mode: PCV FiO2 (%):  [10 %-40 %] 10 % Set Rate:  [15 bmp] 15 bmp PEEP:  [5 cmH20] 5 cmH20 Plateau Pressure:  [17 cmH20-20 cmH20] 20 cmH20   Intake/Output Summary (Last 24 hours) at 12/09/2019 1111 Last data filed at 12/09/2019 0954 Gross per 24 hour  Intake 1161.67 ml  Output 1200 ml  Net -38.33 ml   Filed Weights   12/02/19 0436 12/08/19 0500 12/09/19 0351  Weight: 78 kg 78.9 kg 79.1 kg    Examination: General: Chronically ill appearing deconditioned elderly female on ACT, in NAD HEENT: 6 cuffed trach midline, MM pink/moist, PERRL, sclera non-icteric, incisions closed, soft area overlying crani site. Neuro: Will open eyes to verbal stimuli, remains unable to follow commands CV: regular rate and rhythm, no murmur, rubs, or gallops,  PULM:  Coarse rhonchi bilaterally, upper airways.   GI: soft, bowel sounds active in all 4 quadrants, non-tender, non-distended, tolerating TF Extremities: warm/dry, generalized edema 1-2+ pulses radial, PT Skin: no rashes or lesions  Resolved Hospital Problem list   Hyponatremia Hyperchloremia Hyperkalemia Hyperphosphatemia  Assessment & Plan:   Acute hypoxic respiratory failure with compromised airway in setting of SAH.  -S/P Trach 6/17  Plan: Goal for ATC as  patient can tolerate Routine trach care, frequent suctioning to clear secretions Follow intermittent CXR and ABG Encourage frequent pulmonary hygiene   Acute left-sided SAH with hydrocephalus,  Likely ruptured left MCA aneurysm. -EVD placed 6/7 -Clipping 6/7 Hx of seizures. Plan: Management per neurology  Maintain neuro protective measures; goal for eurothermia,  euglycemia, eunatermia, normoxia, and PCO2 goal of 35-40 Nutrition and bowel regiment  Seizure precautions  EVD per oneirology   Concern for Asp PNA -Sputum 6/17 with Staph Epidermidis     -PCT 1.04 > 1.44 -CXR with no acute findings  -Fever spike of 101.4 morning of 6/24, Tm 101.3 x1 reading yesterday, normotherm. Otherwise. Status post 5 days of vancomycin/Zyvox Plan  ? Neurogenic with diurnal spike --Continue to observe off antibiotics --Trend WBC and fever curve   RUE DVT  -Bilateral upper extremity Doppler positive for left axillary deep vein thrombosis , bilateral lower extremity Dopplers negative  -No role for IVC filter  Plan: Unable to anticoagulate given SAH and continued anemia  Defer to neurology for initiation   Anemia -S/p 1 unit of PRBC 6/22 Plan: Hemoglobin remains stable, goal greater than 7 Trend CBC  Acute on CKD3, improving off vanc  -Baseline creatinine 1.25, near that now at 1.3; creatinine peaked at 2.13 on 6/20 -Patient not a candidate for dialysis -Nephrology consulted during admission, signed off 6/06 Non gap metabolic acidosis, improving Plan: Follow renal function / urine output Trend Bmet Avoid nephrotoxins, ensure adequate renal perfusion  Enteral hydration   HTN. -Home Norvasc and Hydralazine on hold due to episode of hypotension  Plan Avoid hypotension   Hypoglycemic Events  DM II Plan: Continue SSI and long acting insulin  CBG q4  Best practice:  Diet: Tube feeds Pain/Anxiety/Delirium protocol (if indicated): n/a VAP protocol: if indicated  DVT prophylaxis: Add Lovenox when OK with NSG GI prophylaxis: pepcid  Glucose control: SSI, Levemir Mobility: Bed rest Code Status: Full code Family Communication: Will updated on plan of care.  Disposition: Continue ICU Care.   Labs   CBC: Recent Labs  Lab 12/03/19 0329 12/03/19 0329 12/04/19 0445 12/04/19 0445 12/04/19 0857 12/05/19 0300 12/05/19 0510 12/06/19 0935 12/08/19  0435  WBC 14.1*  --  9.6  --   --   --  9.0 8.9 10.4  HGB 8.3*   < > 7.4*   < > 7.5* 6.5* 6.8* 7.9* 8.4*  HCT 28.0*   < > 23.9*   < > 22.0* 19.0* 21.8* 25.4* 26.7*  MCV 101.8*  --  99.2  --   --   --  97.8 96.9 96.7  PLT 572*  --  524*  --   --   --  414* 488* 485*   < > = values in this interval not displayed.    Basic Metabolic Panel: Recent Labs  Lab 12/03/19 0329 12/03/19 1548 12/04/19 0445 12/04/19 0857 12/04/19 1809 12/05/19 0300 12/05/19 0510 12/06/19 0935 12/08/19 0435  NA 150*   < > 146*   < > 141 141 138 143 142  K 5.4*   < > 4.5   < > 4.6 4.5 4.7 4.4 4.5  CL 121*   < > 117*  --  113*  --  109 112* 117*  CO2 18*   < > 19*  --  18*  --  19* 20* 14*  GLUCOSE 74   < > 71  --  197*  --  251* 185* 251*  BUN 76*   < > 85*  --  84*  --  77* 71* 56*  CREATININE 1.94*   < > 1.85*  --  1.74*  --  1.62* 1.51* 1.36*  CALCIUM 9.6   < > 9.4  --  9.0  --  9.0 9.3 9.5  MG 2.7*  --  2.6*  --   --   --  2.4  --   --   PHOS 7.2*  --  5.8*  --   --   --  3.4  --   --    < > = values in this interval not displayed.   GFR: Estimated Creatinine Clearance: 40.8 mL/min (A) (by C-G formula based on SCr of 1.36 mg/dL (H)). Recent Labs  Lab 12/03/19 0329 12/03/19 0329 12/04/19 0445 12/05/19 0510 12/06/19 0935 12/08/19 0435  PROCALCITON 1.44  --   --   --   --   --   WBC 14.1*   < > 9.6 9.0 8.9 10.4   < > = values in this interval not displayed.    Liver Function Tests: No results for input(s): AST, ALT, ALKPHOS, BILITOT, PROT, ALBUMIN in the last 168 hours. No results for input(s): LIPASE, AMYLASE in the last 168 hours. No results for input(s): AMMONIA in the last 168 hours.  ABG    Component Value Date/Time   PHART 7.442 12/05/2019 0300   PCO2ART 32.3 12/05/2019 0300   PO2ART 109 (H) 12/05/2019 0300   HCO3 21.8 12/05/2019 0300   TCO2 23 12/05/2019 0300   ACIDBASEDEF 2.0 12/05/2019 0300   O2SAT 98.0 12/05/2019 0300     Coagulation Profile: No results for input(s):  INR, PROTIME in the last 168 hours.  Cardiac Enzymes: No results for input(s): CKTOTAL, CKMB, CKMBINDEX, TROPONINI in the last 168 hours.  HbA1C: Hgb A1c MFr Bld  Date/Time Value Ref Range Status  11/21/2019 10:55 AM 7.8 (H) 4.8 - 5.6 % Final    Comment:    (NOTE) Pre diabetes:          5.7%-6.4% Diabetes:              >6.4% Glycemic control for   <7.0% adults with diabetes     CBG: Recent Labs  Lab 12/08/19 1600 12/08/19 1945 12/08/19 2320 12/09/19 0327 12/09/19 0807  GLUCAP 171* 126* 114* 248* 212*       Critical care time:      This patient is critically ill with multiple organ system failure; which, requires frequent high complexity decision making, assessment, support, evaluation, and titration of therapies. This was completed through the application of advanced monitoring technologies and extensive interpretation of multiple databases. During this encounter critical care time was devoted to patient care services described in this note for 38 minutes.  Bonna Gains, MD PhD  12/09/2019 11:17 AM

## 2019-12-10 DIAGNOSIS — I6012 Nontraumatic subarachnoid hemorrhage from left middle cerebral artery: Secondary | ICD-10-CM | POA: Diagnosis not present

## 2019-12-10 DIAGNOSIS — I609 Nontraumatic subarachnoid hemorrhage, unspecified: Secondary | ICD-10-CM | POA: Diagnosis not present

## 2019-12-10 LAB — GLUCOSE, CAPILLARY
Glucose-Capillary: 138 mg/dL — ABNORMAL HIGH (ref 70–99)
Glucose-Capillary: 138 mg/dL — ABNORMAL HIGH (ref 70–99)
Glucose-Capillary: 186 mg/dL — ABNORMAL HIGH (ref 70–99)
Glucose-Capillary: 201 mg/dL — ABNORMAL HIGH (ref 70–99)
Glucose-Capillary: 207 mg/dL — ABNORMAL HIGH (ref 70–99)
Glucose-Capillary: 51 mg/dL — ABNORMAL LOW (ref 70–99)
Glucose-Capillary: 96 mg/dL (ref 70–99)

## 2019-12-10 LAB — BASIC METABOLIC PANEL
Anion gap: 11 (ref 5–15)
BUN: 73 mg/dL — ABNORMAL HIGH (ref 8–23)
CO2: 15 mmol/L — ABNORMAL LOW (ref 22–32)
Calcium: 9.4 mg/dL (ref 8.9–10.3)
Chloride: 119 mmol/L — ABNORMAL HIGH (ref 98–111)
Creatinine, Ser: 1.54 mg/dL — ABNORMAL HIGH (ref 0.44–1.00)
GFR calc Af Amer: 39 mL/min — ABNORMAL LOW (ref >60)
GFR calc non Af Amer: 34 mL/min — ABNORMAL LOW (ref >60)
Glucose, Bld: 246 mg/dL — ABNORMAL HIGH (ref 70–99)
Potassium: 5 mmol/L (ref 3.5–5.1)
Sodium: 145 mmol/L (ref 135–145)

## 2019-12-10 LAB — CBC
HCT: 24.6 % — ABNORMAL LOW (ref 36.0–46.0)
Hemoglobin: 7.6 g/dL — ABNORMAL LOW (ref 12.0–15.0)
MCH: 30.6 pg (ref 26.0–34.0)
MCHC: 30.9 g/dL (ref 30.0–36.0)
MCV: 99.2 fL (ref 80.0–100.0)
Platelets: 393 10*3/uL (ref 150–400)
RBC: 2.48 MIL/uL — ABNORMAL LOW (ref 3.87–5.11)
RDW: 15.2 % (ref 11.5–15.5)
WBC: 10.1 10*3/uL (ref 4.0–10.5)
nRBC: 0 % (ref 0.0–0.2)

## 2019-12-10 MED ORDER — INSULIN ASPART 100 UNIT/ML ~~LOC~~ SOLN
2.0000 [IU] | SUBCUTANEOUS | Status: DC
  Administered 2019-12-10 – 2019-12-12 (×11): 2 [IU] via SUBCUTANEOUS

## 2019-12-10 MED ORDER — DEXTROSE 10 % IV SOLN: INTRAVENOUS | Status: DC

## 2019-12-10 MED ORDER — DEXTROSE 50 % IV SOLN
INTRAVENOUS | Status: AC
  Administered 2019-12-10: 25 g via INTRAVENOUS
  Filled 2019-12-10: qty 50

## 2019-12-10 MED ORDER — DEXTROSE 50 % IV SOLN: 25.0000 g | INTRAVENOUS | Status: AC

## 2019-12-10 NOTE — Progress Notes (Signed)
eLink Physician-Brief Progress Note Patient Name: Rebecca Green DOB: 09-28-1950 MRN: [id number]   Date of Service  12/10/2019  HPI/Events of Note  Agitation - Patient keeps popping themselves off the ventilator.   eICU Interventions  Plan: 1. Bilateral soft wrist restraints X 7 hours.      Intervention Category Major Interventions: Delirium, psychosis, severe agitation - evaluation and management  [name] 12/10/2019, 1:46 AM

## 2019-12-10 NOTE — Progress Notes (Signed)
Patient ID: Rebecca Green, female   DOB: Nov 06, 1950, 69 y.o.   MRN: [id number] More alert and awake IVC site is dry and clean Opens eyes more spontaneously Does follow some simple commands

## 2019-12-10 NOTE — Progress Notes (Signed)
NAME:  Rebecca Green, MRN:  [id number], DOB:  01-20-1951, LOS: 21 ADMISSION DATE:  11/19/2019, CONSULTATION DATE:  11/20/19 REFERRING MD:  Zada Finders, CHIEF COMPLAINT:  SAH    Brief History   69 year old female with PMH significant for seizures, previous CVA's, HTN, and known left MCA aneurysm who reports for sudden unresponsiveness, left-sided facial droop, and right upper extremity weakness. CT Head revealed large volume left sided SAH with 59mm midline shift.   Past Medical History  CVA (No residual deficits) HTN Seizures DM II   Significant Hospital Events   6/06 Admitted to [facility] NeuroICU 6/07 Aneurysm clipped. Acute decompensation - right frontal EVD placed. Intubated for airway protection 6/11 fever 101.3 6/12 change to pressure control 6/14 on PS wean 6/17 Trach Placed   Consults:  Neurology Neurosurgery  Procedures:  6/7 EVD >>6/27 6/7 ETT > 6/17 6/7 Left pterional craniotomy for clipping of MCA aneurysm 6/17 Trach >>   Significant Diagnostic Tests:  6/7 CT Head > Large volume SAH overlying left cerebral hemisphere with  left to right midline shift. New hydrocephalus of lateral and third vents. SAH reflux into occipital horns of lateral vents.  6/6 CTA Neck > Patent common and internal carotids without significant stenosis. Mixed plaque within proximal left ICA. Patent vertebrals.  6/6 CTA Head > 3x2 mm left MCA bifurcation aneurysm (increased in size from April 2020). No intracranial LVO. High grade focal stenosis within inferior division mid M2 left MCA 6/9 renal u/s > Right renal cysts without complicating factors.  No hydronephrosis. 6/19 renal u/s> No hydronephrosis or shadowing stone. 2. Stable right renal upper pole cyst.  Micro Data:  6/6 SARS CoV2 > Negative 6/8 resp >  Normal oral flora 6/17 resp > Few Staph Epidermidis > resistant to erythromycin, oxacillin, and Bactrim 6/19 blood cultures 1>   Antimicrobials:  6/7 Ancef x1 6/7 Rocephin > 6/10 Ancef  6/18 x 1 6/18 vancomycin 6/20 6/20 Zyvox 6/22  Interim history/subjective:   No issues overnight. On and off vent as needed. EVD pulled this AM.   Objective   Blood pressure (!) 148/56, pulse 77, temperature 99.2 F (37.3 C), temperature source Axillary, resp. rate (!) 31, height 5\' 6"  (1.676 m), weight 81.1 kg, SpO2 100 %.    Vent Mode: PCV FiO2 (%):  [40 %] 40 % Set Rate:  [15 bmp] 15 bmp PEEP:  [5 cmH20] 5 cmH20 Pressure Support:  [12 cmH20] 12 cmH20 Plateau Pressure:  [20 [id number] cmH20] 20 cmH20   Intake/Output Summary (Last 24 hours) at 12/10/2019 1052 Last data filed at 12/10/2019 0800 Gross per 24 hour  Intake 1498.33 ml  Output 1100 ml  Net 398.33 ml   Filed Weights   12/08/19 0500 12/09/19 0351 12/10/19 0457  Weight: 78.9 kg 79.1 kg 81.1 kg    Examination: General: elderly, chronically ill, trach in place HEENT: trach in place  Neuro: follows commands, more awake today CV: RRR, s1 s2 PULM:  BL vented breaths, no crackles JT:TSVX, nt nd  Extremities: BL UE edema  Skin: no rash   Resolved Hospital Problem list   Hyponatremia Hyperchloremia Hyperkalemia Hyperphosphatemia  Assessment & Plan:   Acute hypoxic respiratory failure with compromised airway in setting of SAH.  -S/P Trach 6/17  Plan: Keeps going on and off vent  On TCT this AM I think she will need an LTACH This was discussed with CM last week   Acute left-sided SAH with hydrocephalus,  Likely ruptured left MCA  aneurysm. -EVD placed 6/7 -Clipping 6/7 Hx of seizures. Plan: EVD removed   Concern for Asp PNA -Sputum 6/17 with Staph Epidermidis     -PCT 1.04 > 1.44 -CXR with no acute findings  -Fever spike of 101.4 morning of 6/24, Tm 101.3 x1 reading yesterday, normotherm. Otherwise. Status post 5 days of vancomycin/Zyvox Plan  - would continue to observe off Abx   RUE DVT  -Bilateral upper extremity Doppler positive for left axillary deep vein thrombosis , bilateral lower extremity  Dopplers negative  -No role for IVC filter  Plan: Unable to start [facility] due to [facility]  Would like to start VTE ppx dosing once approved by [facility]   Anemia -S/p 1 unit of PRBC 6/22 Plan: Obs, conservative threshold for transfusion   Acute on CKD3, improving off vanc  -Baseline creatinine 1.25, near that now at 1.3; creatinine peaked at 2.13 on 6/20 -Patient not a candidate for dialysis -Nephrology consulted during admission, signed off 9/62 Non gap metabolic acidosis, improving Plan: Observe UOP and BMP   HTN. -Home Norvasc and Hydralazine on hold due to episode of hypotension  Plan Observe   Hypoglycemic Events  DM II Plan: SSI, lantus, CBGS  Best practice:  Diet: Tube feeds Pain/Anxiety/Delirium protocol (if indicated): n/a VAP protocol: if indicated  DVT prophylaxis: Add Lovenox when OK with NSG GI prophylaxis: pepcid  Glucose control: SSI, Levemir Mobility: Bed rest Code Status: Full code Family Communication: Will updated on plan of care.  Disposition: Continue ICU Care.   Labs   CBC: Recent Labs  Lab 12/04/19 0445 12/04/19 0857 12/05/19 0300 12/05/19 0510 12/06/19 0935 12/08/19 0435 12/10/19 0438  WBC 9.6  --   --  9.0 8.9 10.4 10.1  HGB 7.4*   < > 6.5* 6.8* 7.9* 8.4* 7.6*  HCT 23.9*   < > 19.0* 21.8* 25.4* 26.7* 24.6*  MCV 99.2  --   --  97.8 96.9 96.7 99.2  PLT 524*  --   --  414* 488* 485* 393   < > = values in this interval not displayed.    Basic Metabolic Panel: Recent Labs  Lab 12/04/19 0445 12/04/19 0857 12/04/19 1809 12/04/19 1809 12/05/19 0300 12/05/19 0510 12/06/19 0935 12/08/19 0435 12/10/19 0438  NA 146*   < > 141   < > 141 138 143 142 145  K 4.5   < > 4.6   < > 4.5 4.7 4.4 4.5 5.0  CL 117*  --  113*  --   --  109 112* 117* 119*  CO2 19*  --  18*  --   --  19* 20* 14* 15*  GLUCOSE 71  --  197*  --   --  251* 185* 251* 246*  BUN 85*  --  84*  --   --  77* 71* 56* 73*  CREATININE 1.85*  --  1.74*  --   --  1.62* 1.51* 1.36*  1.54*  CALCIUM 9.4  --  9.0  --   --  9.0 9.3 9.5 9.4  MG 2.6*  --   --   --   --  2.4  --   --   --   PHOS 5.8*  --   --   --   --  3.4  --   --   --    < > = values in this interval not displayed.   GFR: Estimated Creatinine Clearance: 36.5 mL/min (A) (by C-G formula based on SCr of 1.54 mg/dL (  H)). Recent Labs  Lab 12/05/19 0510 12/06/19 0935 12/08/19 0435 12/10/19 0438  WBC 9.0 8.9 10.4 10.1    Liver Function Tests: No results for input(s): AST, ALT, ALKPHOS, BILITOT, PROT, ALBUMIN in the last 168 hours. No results for input(s): LIPASE, AMYLASE in the last 168 hours. No results for input(s): AMMONIA in the last 168 hours.  ABG    Component Value Date/Time   PHART 7.442 12/05/2019 0300   PCO2ART 32.3 12/05/2019 0300   PO2ART 109 (H) 12/05/2019 0300   HCO3 21.8 12/05/2019 0300   TCO2 23 12/05/2019 0300   ACIDBASEDEF 2.0 12/05/2019 0300   O2SAT 98.0 12/05/2019 0300     Coagulation Profile: No results for input(s): INR, PROTIME in the last 168 hours.  Cardiac Enzymes: No results for input(s): CKTOTAL, CKMB, CKMBINDEX, TROPONINI in the last 168 hours.  HbA1C: Hgb A1c MFr Bld  Date/Time Value Ref Range Status  11/21/2019 10:55 AM 7.8 (H) 4.8 - 5.6 % Final    Comment:    (NOTE) Pre diabetes:          5.7%-6.4% Diabetes:              >6.4% Glycemic control for   <7.0% adults with diabetes     CBG: Recent Labs  Lab 12/09/19 1559 12/09/19 1949 12/09/19 2315 12/10/19 0325 12/10/19 0759  GLUCAP 202* 177* 189* 186* 207*     Garner Nash, DO [facility] 12/10/2019 10:52 AM

## 2019-12-11 DIAGNOSIS — J9601 Acute respiratory failure with hypoxia: Secondary | ICD-10-CM | POA: Diagnosis not present

## 2019-12-11 DIAGNOSIS — I609 Nontraumatic subarachnoid hemorrhage, unspecified: Secondary | ICD-10-CM | POA: Diagnosis not present

## 2019-12-11 DIAGNOSIS — J9602 Acute respiratory failure with hypercapnia: Secondary | ICD-10-CM | POA: Diagnosis not present

## 2019-12-11 DIAGNOSIS — I6012 Nontraumatic subarachnoid hemorrhage from left middle cerebral artery: Secondary | ICD-10-CM | POA: Diagnosis not present

## 2019-12-11 LAB — GLUCOSE, CAPILLARY
Glucose-Capillary: 150 mg/dL — ABNORMAL HIGH (ref 70–99)
Glucose-Capillary: 126 mg/dL — ABNORMAL HIGH (ref 70–99)
Glucose-Capillary: 109 mg/dL — ABNORMAL HIGH (ref 70–99)
Glucose-Capillary: 179 mg/dL — ABNORMAL HIGH (ref 70–99)
Glucose-Capillary: 140 mg/dL — ABNORMAL HIGH (ref 70–99)
Glucose-Capillary: 118 mg/dL — ABNORMAL HIGH (ref 70–99)

## 2019-12-11 MED ORDER — SCOPOLAMINE 1 MG/3DAYS TD PT72
1.0000 | MEDICATED_PATCH | TRANSDERMAL | Status: DC
  Administered 2019-12-11 – 2019-12-26 (×6): 1.5 mg via TRANSDERMAL
  Filled 2019-12-11 (×7): qty 1

## 2019-12-11 NOTE — Evaluation (Signed)
[facility] Speaking Valve - Evaluation Patient Details  Name: Rebecca Green MRN: [id number] Date of Birth: 1950-10-30  Today's Date: 12/11/2019 Time: 0950-1000 SLP Time Calculation (min) (ACUTE ONLY): 10 min  Past Medical History:  Past Medical History:  Diagnosis Date  . CVA (cerebral vascular accident) ([place])   . HTN (hypertension)   . Seizure [facility])    Past Surgical History:  Past Surgical History:  Procedure Laterality Date  . CRANIOTOMY Left 11/20/2019   Procedure: LEFT CRANIOTOMY INTRACRANIAL ANEURYSM CLIPPING;  Surgeon: [name], MD;  Location: [place];  Service: Neurosurgery;  Laterality: Left;  . IR 3D INDEPENDENT WKST  11/20/2019  . IR ANGIO INTRA EXTRACRAN SEL INTERNAL CAROTID BILAT MOD SED  11/20/2019  . IR ANGIO VERTEBRAL SEL VERTEBRAL UNI L MOD SED  11/20/2019  . IR GASTROSTOMY TUBE MOD SED  12/07/2019  . IR US GUIDE VASC ACCESS RIGHT  11/20/2019  . RADIOLOGY WITH ANESTHESIA N/A 11/20/2019   Procedure: IR WITH ANESTHESIA;  Surgeon: [name], MD;  Location: [place];  Service: Radiology;  Laterality: N/A;   HPI:  Pt is a 69 yo female presenting wiht AMS, L facial droop, and RUE weakness. CT head revealed large volume left sided SAH with 51mm midline shift s/p aneurysm clipping and EVD placement 6/7 (EVD d/c 6/27). She was intubated 6/7; trach placed 6/17. PEG 6/24. PMH includes: CVA (no residual deficits), seizures, HTN, DM II, L MCA aneurysm   Assessment / Plan / Recommendation Clinical Impression  PMV was placed after RN had just suctioned, although still with audible secretions noted. Pt coughed spontaneously x1 prior to placement to clear an additional small amount of thin appearing secretions, but she did not cough to command again throughout testing. PMV was placed several times but only for intervals lasting a few respiratory cycles. WOB would increase, and a burst of air noted upon doffing was concerning for back pressure. She had no attempts to vocalize or  verbalize despite cues. Would use PMV only with SLP for now. She may need a smaller and/or cuffless trach in order to tolerate PMV.  SLP Visit Diagnosis: Aphonia (R49.1)    SLP Assessment  Patient needs continued Speech Lanaguage Pathology Services    Follow Up Recommendations  LTACH;Skilled Nursing facility    Frequency and Duration min 2x/week  2 weeks    PMSV Trial PMSV was placed for: few respiratory cycles Able to redirect subglottic air through upper airway: No Able to Attain Phonation: No attempt to phonate Voice Quality: Aphonic Able to Expectorate Secretions: No attempts Level of Secretion Expectoration with PMSV: Tracheal Intelligibility: Unable to assess (comment) Respirations During Trial:  (30s) SpO2 During Trial: 97 % Pulse During Trial: 97   Tracheostomy Tube       Vent Dependency  FiO2 (%): 40 %    Cuff Deflation Trial  GO Tolerated Cuff Deflation:  (deflated upon SLP arrival)        Osie Bond., M.A. [facility] Pager [phone] Office [phone]  12/11/2019, 10:08 AM

## 2019-12-11 NOTE — Progress Notes (Signed)
Occupational Therapy Treatment Patient Details Name: Rebecca Green MRN: [id number] DOB: 1950/07/09 Today's Date: 12/11/2019    History of present illness Pt is a 69 yo female with ho of HTN, CVAs and known L MCA aneurysm that became unresponsive on 6/6.  Pt now with L SAH with midline shift.  Pt had aneurysm clipped and ventriculostomy placed. Intubated 11/20/19 and trach placed on 6/17.  s/p G tube 12/07/19.   OT comments  This 69 yo female admitted with above presents to acute OT with tolerating OT/PT session focusing on sitting balance, following commands, and vision. More awake and alert overall, but not following commands. She did move all 4 extremities spontaneously as well as some shifting of her trunk in sitting. She will continue to benefit from acute OT with follow up at SNF.  Follow Up Recommendations  SNF;Supervision/Assistance - 24 hour    Equipment Recommendations  Other (comment) (TBD next venue)       Precautions / Restrictions Precautions Precautions: Fall Restrictions Weight Bearing Restrictions: No       Mobility Bed Mobility Overal bed mobility: Needs Assistance Bed Mobility: Supine to Sit;Sit to Supine     Supine to sit: Total assist;+2 for physical assistance Sit to supine: Total assist;+2 for physical assistance   General bed mobility comments: Pt did not initiate move to sit or return to supine, total assit for both transitions.      Balance Overall balance assessment: Needs assistance Sitting-balance support: Feet supported;Bilateral upper extremity supported   Sitting balance - Comments: max assist, when support lifted, not much initiation to correct posterior lean, however, when positione in L/R elbow prop she does slowly right herself up to sitting, spontaneously initiates some trunk shifting and head righting varying times throughout 15 mins EOB.   Postural control: Posterior lean (posterior pelvic tilt R/L; lateral lean (mainly L))                                  ADL either performed or assessed with clinical judgement   ADL Overall ADL's : Needs assistance/impaired                                       General ADL Comments: Pt remains fully dependent for all adls, trach and not following commands. Pt alert and looking all over the room but not to command     Vision   Additional Comments: Pt moving eyes and head and looking all around room, gaze dysconjugate at times          Cognition Arousal/Alertness: Awake/alert;Lethargic (varies) Behavior During Therapy: Flat affect;Restless (mildly restless) Overall Cognitive Status: Impaired/Different from baseline Area of Impairment: Attention;Following commands;Problem solving                   Current Attention Level: Focused   Following Commands: Follows one step commands inconsistently (did not follow any commands for [place])     Problem Solving: Decreased initiation General Comments: Pt with significant cognitive deficits, responds most briskly to noxious stimuli, does seem to make purposeful eye contact, did not follow any basic commands for [place] today and is slow, but does initiate movement when positioned in R/L elbow prop positions, but slow.                    Pertinent Vitals/  Pain       Pain Assessment: Faces Faces Pain Scale: No hurt     Prior Functioning/Environment              Frequency  Min 2X/week        Progress Toward Goals  OT Goals(current goals can now be found in the care plan section)  Progress towards OT goals: Not progressing toward goals - comment (remains total A for all basic ADLs and not following commands, was however more awake and alert overall)  Acute Rehab OT Goals Patient Stated Goal: unable to state OT Goal Formulation: Patient unable to participate in goal setting Time For Goal Achievement: 12/18/19 Potential to Achieve Goals: [place] Discharge plan remains appropriate     Co-evaluation    PT/OT/SLP Co-Evaluation/Treatment: Yes Reason for Co-Treatment: Complexity of the patient's impairments (multi-system involvement);Necessary to address cognition/behavior during functional activity;For patient/therapist safety;To address functional/ADL transfers PT goals addressed during session: Mobility/safety with mobility;Balance;Strengthening/ROM OT goals addressed during session: ADL's and self-care;Strengthening/ROM      AM-PAC OT "6 Clicks" Daily Activity     Outcome Measure   Help from another person eating meals?: Total Help from another person taking care of personal grooming?: Total Help from another person toileting, which includes using toliet, bedpan, or urinal?: Total Help from another person bathing (including washing, rinsing, drying)?: Total Help from another person to put on and taking off regular upper body clothing?: Total Help from another person to put on and taking off regular lower body clothing?: Total 6 Click Score: 6    End of Session Equipment Utilized During Treatment: Oxygen (40% 10 liters)  OT Visit Diagnosis: Other symptoms and signs involving the nervous system (R29.898);Other symptoms and signs involving cognitive function;Hemiplegia and hemiparesis;Low vision, both eyes (H54.2) Hemiplegia - Right/Left: Right Hemiplegia - caused by: Nontraumatic SAH   Activity Tolerance Patient tolerated treatment well   Patient Left in bed;with call bell/phone within reach;with restraints reapplied;with bed alarm set   Nurse Communication Mobility status (how she did with therapy)        Time: 1005-1037 OT Time Calculation (min): 32 min  Charges: OT General Charges $OT Visit: 1 Visit OT Treatments $Therapeutic Activity: 8-22 mins  [place], OTR/L Acute [employer] Pager [phone] Office [phone]     Almon Register 12/11/2019, 5:33 PM

## 2019-12-11 NOTE — Progress Notes (Signed)
  NEUROSURGERY PROGRESS NOTE   No issues overnight.   EXAM:  BP (!) 157/70   Pulse 95   Temp 98.4 F (36.9 C) (Axillary)   Resp 20   Ht 5\' 6"  (1.676 m)   Wt 81.3 kg   SpO2 94%   BMI 28.93 kg/m   Opens eyes to voice Pupils reactive Breathing spontaneously on trach collar Follows simple commands  IMPRESSION:  69 y.o. female s/p SAH and clipping LMCA aneurysm. Neurologically stable VDRF resolving  PLAN: - Will need dispo planning - LTACH v SNF likely

## 2019-12-11 NOTE — Progress Notes (Signed)
NAME:  Rebecca Green, MRN:  [id number], DOB:  [birth year], LOS: 34 ADMISSION DATE:  11/19/2019, CONSULTATION DATE:  11/20/19 REFERRING MD:  Zada Finders, CHIEF COMPLAINT:  SAH    Brief History   69 year old female with PMH significant for seizures, previous CVA's, HTN, and known left MCA aneurysm who reports for sudden unresponsiveness, left-sided facial droop, and right upper extremity weakness. CT Head revealed large volume left sided SAH with 65mm midline shift.   Past Medical History  CVA (No residual deficits) HTN Seizures DM II   Significant Hospital Events   6/06 Admitted to [facility] 6/07 Aneurysm clipped. Acute decompensation - right frontal EVD placed. Intubated for airway protection 6/11 fever 101.3 6/12 change to pressure control 6/14 on PS wean 6/17 Trach Placed   Consults:  Neurology Neurosurgery  Procedures:  6/7 EVD >>6/27 6/7 ETT > 6/17 6/7 Left pterional craniotomy for clipping of MCA aneurysm 6/17 Trach >>   Significant Diagnostic Tests:  6/7 CT Head > Large volume SAH overlying left cerebral hemisphere with  left to right midline shift. New hydrocephalus of lateral and third vents. SAH reflux into occipital horns of lateral vents.  6/6 CTA Neck > Patent common and internal carotids without significant stenosis. Mixed plaque within proximal left ICA. Patent vertebrals.  6/6 CTA Head > 3x2 mm left MCA bifurcation aneurysm (increased in size from April 2020). No intracranial LVO. High grade focal stenosis within inferior division mid M2 left MCA 6/9 renal u/s > Right renal cysts without complicating factors.  No hydronephrosis. 6/19 renal u/s> No hydronephrosis or shadowing stone. 2. Stable right renal upper pole cyst.  Micro Data:  6/6 SARS CoV2 > Negative 6/8 resp >  Normal oral flora 6/17 resp > Few Staph Epidermidis > resistant to erythromycin, oxacillin, and Bactrim 6/19 blood cultures 1>   Antimicrobials:  6/7 Ancef x1 6/7 Rocephin > 6/10 Ancef  6/18 x 1 6/18 vancomycin 6/20 6/20 Zyvox 6/22  Interim history/subjective:   Has been off ventilator for 24h. Cuff deflated today.   Objective   Blood pressure (!) 150/98, pulse 97, temperature 99.6 F (37.6 C), temperature source Axillary, resp. rate (!) 32, height 5\' 6"  (1.676 m), weight 81.3 kg, SpO2 98 %.    FiO2 (%):  [35 %-40 %] 35 %   Intake/Output Summary (Last 24 hours) at 12/11/2019 1440 Last data filed at 12/11/2019 1236 Gross per 24 hour  Intake 865.96 ml  Output 950 ml  Net -84.04 ml   Filed Weights   12/09/19 0351 12/10/19 0457 12/11/19 0347  Weight: 79.1 kg 81.1 kg 81.3 kg    Examination: General: elderly, chronically ill, trach in place HEENT: trach in place  Neuro: somnolent, moves spontaneously but not following commands  CV: RRR, s1 s2 PULM: chest clear but significant secretions.  WV:PXTG, nt nd  Extremities: BL UE edema  Skin: no rash   Resolved Hospital Problem list   Hyponatremia Hyperchloremia Hyperkalemia Hyperphosphatemia  Assessment & Plan:   Acute hypoxic respiratory failure with compromised airway in setting of [place].  -S/P Trach 6/17  Plan: - Aggressive pulmonary toilet - Will attempt to keep off ventilator to facilitate placement.  Acute left-sided SAH with hydrocephalus,  Likely ruptured left MCA aneurysm. -EVD placed 6/7 -Clipping 6/7 Hx of seizures. Plan: EVD removed   Concern for Asp PNA -Sputum 6/17 with Staph Epidermidis     -PCT 1.04 > 1.44 -CXR with no acute findings  -Fever spike of 101.4 morning of  6/24, Tm 101.3 x1 reading yesterday, normotherm. Otherwise. Status post 5 days of vancomycin/Zyvox Plan  - would continue to observe off Abx   RUE DVT  -Bilateral upper extremity Doppler positive for left axillary deep vein thrombosis , bilateral lower extremity Dopplers negative  -No role for IVC filter  Plan: Unable to start [facility] due to [facility]  Would like to start VTE ppx dosing once approved by Neuro[facility]    Anemia -S/p 1 unit of PRBC 6/22 Plan: Obs, conservative threshold for transfusion   Acute on CKD3, improving off vanc  -Baseline creatinine 1.25, near that now at 1.3; creatinine peaked at 2.13 on 6/20 -Patient not a candidate for dialysis -Nephrology consulted during admission, signed off 1/76 Non gap metabolic acidosis, improving Plan: Observe UOP and BMP   HTN. -Home Norvasc and Hydralazine on hold due to episode of hypotension  Plan Observe   Hypoglycemic Events  DM II Plan: SSI, lantus, CBGS  Best practice:  Diet: Tube feeds Pain/Anxiety/Delirium protocol (if indicated): n/a VAP protocol: if indicated  DVT prophylaxis: Add Lovenox when OK with NSG GI prophylaxis: pepcid  Glucose control: SSI, Levemir Mobility: Bed rest Code Status: Full code Family Communication: Will updated on plan of care.  Disposition: Continue ICU Care.   Labs   CBC: Recent Labs  Lab 12/05/19 0300 12/05/19 0510 12/06/19 0935 12/08/19 0435 12/10/19 0438  WBC  --  9.0 8.9 10.4 10.1  HGB 6.5* 6.8* 7.9* 8.4* 7.6*  HCT 19.0* 21.8* 25.4* 26.7* 24.6*  MCV  --  97.8 96.9 96.7 99.2  PLT  --  414* 488* 485* 160    Basic Metabolic Panel: Recent Labs  Lab 12/04/19 1809 12/04/19 1809 12/05/19 0300 12/05/19 0510 12/06/19 0935 12/08/19 0435 12/10/19 0438  NA 141   < > 141 138 143 142 145  K 4.6   < > 4.5 4.7 4.4 4.5 5.0  CL 113*  --   --  109 112* 117* 119*  CO2 18*  --   --  19* 20* 14* 15*  GLUCOSE 197*  --   --  251* 185* 251* 246*  BUN 84*  --   --  77* 71* 56* 73*  CREATININE 1.74*  --   --  1.62* 1.51* 1.36* 1.54*  CALCIUM 9.0  --   --  9.0 9.3 9.5 9.4  MG  --   --   --  2.4  --   --   --   PHOS  --   --   --  3.4  --   --   --    < > = values in this interval not displayed.   GFR: Estimated Creatinine Clearance: 36.5 mL/min (A) (by C-G formula based on SCr of 1.54 mg/dL (H)). Recent Labs  Lab 12/05/19 0510 12/06/19 0935 12/08/19 0435 12/10/19 0438  WBC 9.0 8.9  10.4 10.1    Liver Function Tests: No results for input(s): AST, ALT, ALKPHOS, BILITOT, PROT, ALBUMIN in the last 168 hours. No results for input(s): LIPASE, AMYLASE in the last 168 hours. No results for input(s): AMMONIA in the last 168 hours.  ABG    Component Value Date/Time   PHART 7.442 12/05/2019 0300   PCO2ART 32.3 12/05/2019 0300   PO2ART 109 (H) 12/05/2019 0300   HCO3 21.8 12/05/2019 0300   TCO2 23 12/05/2019 0300   ACIDBASEDEF 2.0 12/05/2019 0300   O2SAT 98.0 12/05/2019 0300     Coagulation Profile: No results for input(s): INR, PROTIME in the  last 168 hours.  Cardiac Enzymes: No results for input(s): CKTOTAL, CKMB, CKMBINDEX, TROPONINI in the last 168 hours.  HbA1C: Hgb A1c MFr Bld  Date/Time Value Ref Range Status  11/21/2019 10:55 AM 7.8 (H) 4.8 - 5.6 % Final    Comment:    (NOTE) Pre diabetes:          5.7%-6.4% Diabetes:              >6.4% Glycemic control for   <7.0% adults with diabetes     CBG: Recent Labs  Lab 12/10/19 2032 12/10/19 2337 12/11/19 0357 12/11/19 0839 12/11/19 1226  GLUCAP 96 138* 150* [place], [place] ICU Physician [place]  Pager: [phone] Mobile: [phone] After hours: [phone].   12/11/2019 2:40 PM

## 2019-12-11 NOTE — TOC Progression Note (Signed)
Transition of Care [facility]) - Progression Note    Patient Details  Name: Rebecca Green MRN: [id number] Date of Birth: 08-16-1950  Transition of Care [facility]) CM/SW Contact  Oren Section Cleta Alberts, RN Phone Number: 12/11/2019, 3:53 PM  Clinical Narrative:  After investigation, found that patient only has Medicare part B, which does not cover LTAC hospital.  She does have Medicaid, which may cover skilled nursing facility, but not LTAC.  Will notify daughter.  Will proceed with SNF bed search for trach patient.      Expected Discharge Plan: Long Term Acute Care (LTAC) Barriers to Discharge: Continued Medical Work up  Expected Discharge Plan and Services Expected Discharge Plan: Long Term Acute Care (LTAC)   Discharge Planning Services: CM Consult Post Acute Care Choice: Long Term Acute Care (LTAC) Living arrangements for the past 2 months: Single Family Home                                       Social Determinants of Health (SDOH) Interventions    Readmission Risk Interventions No flowsheet data found.  Reinaldo Raddle, RN, BSN  Trauma/Neuro ICU Case Manager [phone]

## 2019-12-11 NOTE — Progress Notes (Signed)
Physical Therapy Treatment Patient Details Name: Rebecca Green MRN: [id number] DOB: September 14, 1950 Today's Date: 12/11/2019    History of Present Illness Pt is a 69 yo female with ho of HTN, CVAs and known L MCA aneurysm that became unresponsive on 6/6.  Pt now with L SAH with midline shift.  Pt had aneurysm clipped and ventriculostomy placed. Intubated 11/20/19 and trach placed on 6/17.  s/p G tube 12/07/19.    PT Comments    Pt tolerated EOB with PT/OT co session well on 40% TC 10L VSS throughout. She is spontaneously moving all 4 extremities.  We were unable to get her to follow any commands, but does seem to make purposeful eye contact especially when noxious stimuli is applied.  PT increased her frequency as she is more alert and moving more than previous sessions.  I am hopeful for lift OOB to chair next session.    Follow Up Recommendations  LTACH;Other (comment) (if she continues to be on/off vent vs vent SNF)     Equipment Recommendations  Wheelchair (measurements PT);Wheelchair cushion (measurements PT);Hospital bed;Other (comment) (hoyer lift)    Recommendations for Other Services   NA     Precautions / Restrictions   NA   Mobility  Bed Mobility Overal bed mobility: Needs Assistance Bed Mobility: Supine to Sit;Sit to Supine     Supine to sit: Total assist;+2 for physical assistance Sit to supine: Total assist;+2 for physical assistance   General bed mobility comments: Pt did not initiate move to sit or return to supine, total assit for both transitions.       Balance Overall balance assessment: Needs assistance Sitting-balance support: Feet supported;Bilateral upper extremity supported Sitting balance-Leahy Scale: Zero Sitting balance - Comments: max assist, when support lifted, not much initiation to correct posterior lean, however, when positione in L/R elbow prop she does slowly right herself up to sitting, spontaneously initiates some trunk shifting and head righting  varying times throughout 15 mins EOB.   Postural control: Posterior lean (posterior pelvic tilt R/L lateral lean at times (mostly L))                                  Cognition Arousal/Alertness: Awake/alert;Lethargic (varies) Behavior During Therapy: Flat affect;Restless (mildly restless) Overall Cognitive Status: Impaired/Different from baseline Area of Impairment: Attention;Following commands;Problem solving                   Current Attention Level: Focused   Following Commands: Follows one step commands inconsistently (did not follow any commands for [place])     Problem Solving: Decreased initiation General Comments: Pt with significant cognitive deficits, responds most briskly to noxious stimuli, does seem to make purposeful eye contact, did not follow any basic commands for [place] today and is slow, but does initiate movement when positioned in R/L elbow prop positions, but slow.          General Comments General comments (skin integrity, edema, etc.): Pt on 40% TC 10L with VSS throughout.  Some rattling of mucus near her trach, able to externally suction some out.  VSS throughout despite dip in Hgb (7.6)      Pertinent Vitals/Pain Pain Assessment: Faces Faces Pain Scale: No hurt           PT Goals (current goals can now be found in the care plan section) Acute Rehab PT Goals Patient Stated Goal: unable to state Progress towards PT  goals: Progressing toward goals    Frequency    Min 3X/week      PT Plan Frequency needs to be updated (due to some progress in alertness and mental status)    Co-evaluation PT/OT/SLP Co-Evaluation/Treatment: Yes Reason for Co-Treatment: Complexity of the patient's impairments (multi-system involvement);Necessary to address cognition/behavior during functional activity;For patient/therapist safety;To address functional/ADL transfers PT goals addressed during session: Mobility/safety with  mobility;Balance;Strengthening/ROM        AM-PAC PT "6 Clicks" Mobility   Outcome Measure  Help needed turning from your back to your side while in a flat bed without using bedrails?: Total Help needed moving from lying on your back to sitting on the side of a flat bed without using bedrails?: Total Help needed moving to and from a bed to a chair (including a wheelchair)?: Total Help needed standing up from a chair using your arms (e.g., wheelchair or bedside chair)?: Total Help needed to walk in hospital room?: Total Help needed climbing 3-5 steps with a railing? : Total 6 Click Score: 6    End of Session Equipment Utilized During Treatment: Oxygen Activity Tolerance: Patient tolerated treatment well Patient left: in bed;with call bell/phone within reach;with bed alarm set Nurse Communication: Mobility status PT Visit Diagnosis: Unsteadiness on feet (R26.81);Muscle weakness (generalized) (M62.81);Difficulty in walking, not elsewhere classified (R26.2);Hemiplegia and hemiparesis Hemiplegia - Right/Left: Right Hemiplegia - caused by: Nontraumatic SAH     Time: 1005-1037 PT Time Calculation (min) (ACUTE ONLY): 32 min  Charges:  $Therapeutic Activity: 8-22 mins          Verdene Lennert, PT, DPT  Acute Rehabilitation [phone] pager #[phone] office               12/11/2019, 11:02 AM

## 2019-12-11 NOTE — NC FL2 (Signed)
MEDICAID FL2 LEVEL OF CARE SCREENING TOOL     IDENTIFICATION  Patient Name: Rebecca Green Birthdate: 1951-05-17 Sex: female Admission Date (Current Location): 11/19/2019  [facility] [place] Number:  Herbalist and Address:  [facility] [place], [place] [address], [place], [place] [address]      Provider Number: [id number]  Attending Physician Name and Address:  [name], MD  Relative Name and Phone Number:  [facility]    Current Level of Care: Hospital Recommended Level of Care: [place] Prior Approval Number:    Date Approved/Denied:   PASRR Number: [id number]  Discharge Plan: SNF    Current Diagnoses: Patient Active Problem List   Diagnosis Date Noted  . Endotracheal tube present   . AKI (acute kidney injury) ([place])   . SAH (subarachnoid hemorrhage) ([place])   . Status post tracheostomy ([place])   . Acute respiratory failure ([place])   . Aneurysmal subarachnoid hemorrhage ([place]) 11/19/2019    Orientation RESPIRATION BLADDER Height & Weight        Tracheostomy (35%) Continent, External catheter Weight: 179 lb 3.7 oz (81.3 kg) Height:  5' 6" (167.6 cm)  BEHAVIORAL SYMPTOMS/MOOD NEUROLOGICAL BOWEL NUTRITION STATUS      Continent Diet (See discharge summary)  AMBULATORY STATUS COMMUNICATION OF NEEDS Skin   Total Care Non-Verbally (Purposeful Eye Movements) Normal                       Personal Care Assistance Level of Assistance  Bathing, Feeding, Dressing Bathing Assistance: Maximum assistance Feeding assistance: Maximum assistance Dressing Assistance: Maximum assistance     Functional Limitations Info  Sight, Speech, Hearing Sight Info: Adequate Hearing Info: Adequate Speech Info: Adequate    SPECIAL CARE FACTORS FREQUENCY                       Contractures Contractures Info: Not present    Additional Factors Info  Code Status Code Status Info: PARTIAL             Current  Medications (12/11/2019):  This is the current hospital active medication list Current Facility-Administered Medications  Medication Dose Route Frequency Provider Last Rate Last Admin  . 0.9 %  sodium chloride infusion (Manually program via Guardrails IV Fluids)   Intravenous Once Anders Simmonds, MD      . acetaminophen (TYLENOL) tablet 650 mg  650 mg Oral Q4H PRN Judith Part, MD       Or  . acetaminophen (TYLENOL) 160 MG/5ML solution 650 mg  650 mg Per Tube Q4H PRN Judith Part, MD   650 mg at 12/09/19 1343   Or  . acetaminophen (TYLENOL) suppository 650 mg  650 mg Rectal Q4H PRN Judith Part, MD   650 mg at 11/20/19 0351  . albuterol (PROVENTIL) (2.5 MG/3ML) 0.083% nebulizer solution 2.5 mg  2.5 mg Nebulization Q4H PRN Omar Person, NP      . bisacodyl (DULCOLAX) EC tablet 5 mg  5 mg Oral Daily PRN Costella, Vista Mink, PA-C      . chlorhexidine gluconate (MEDLINE KIT) (PERIDEX) 0.12 % solution 15 mL  15 mL Mouth Rinse BID Cristal Generous, NP   15 mL at 12/11/19 0854  . Chlorhexidine Gluconate Cloth 2 % PADS 6 each  6 each Topical Q0600 Audria Nine, DO   6 each at 12/11/19 0451  . dextrose 10 % infusion   Intravenous  Continuous Icard, Bradley L, DO   Stopped at 12/11/19 [phone]  . famotidine (PEPCID) 40 MG/5ML suspension 20 mg  20 mg Per Tube Daily Omar Person, NP   20 mg at 12/11/19 0943  . feeding supplement (JEVITY 1.2 CAL) liquid 1,000 mL  1,000 mL Per Tube Q24H [name], MD 40 mL/hr at 12/11/19 1600 Rate Change at 12/11/19 1600  . feeding supplement (PRO-STAT SUGAR FREE 64) liquid 60 mL  60 mL Per Tube BID Audria Nine, DO   60 mL at 12/11/19 0942  . fentaNYL (SUBLIMAZE) injection 25-100 mcg  25-100 mcg Intravenous Q30 min PRN Chesley Mires, MD   100 mcg at 12/10/19 0618  . free water 200 mL  200 mL Per Tube Q6H [name], MD   200 mL at 12/11/19 1712  . insulin aspart (novoLOG) injection 0-9 Units  0-9 Units Subcutaneous Q4H  Stretch, Marily Lente, MD   2 Units at 12/11/19 1610  . insulin aspart (novoLOG) injection 2 Units  2 Units Subcutaneous Q4H Icard, Bradley L, DO   2 Units at 12/11/19 1610  . insulin detemir (LEVEMIR) injection 12 Units  12 Units Subcutaneous Daily Omar Person, NP   12 Units at 12/11/19 0943  . ipratropium-albuterol (DUONEB) 0.5-2.5 (3) MG/3ML nebulizer solution 3 mL  3 mL Nebulization Q4H PRN Merlene Laughter F, NP      . levETIRAcetam (KEPPRA) 100 MG/ML solution 500 mg  500 mg Per Tube BID Ronna Polio, RPH   500 mg at 12/11/19 0942  . MEDLINE mouth rinse  15 mL Mouth Rinse 10 times per day Cristal Generous, NP   15 mL at 12/11/19 1712  . multivitamin with minerals tablet 1 tablet  1 tablet Per Tube Daily Audria Nine, DO   1 tablet at 12/11/19 0942  . naloxone [facility]) injection 0.08 mg  0.08 mg Intravenous PRN Costella, Vista Mink, PA-C      . ondansetron (ZOFRAN) tablet 4 mg  4 mg Oral Q4H PRN Costella, Vista Mink, PA-C       Or  . ondansetron (ZOFRAN) injection 4 mg  4 mg Intravenous Q4H PRN Costella, Vincent J, PA-C   4 mg at 12/10/19 1532  . promethazine (PHENERGAN) tablet 12.5-25 mg  12.5-25 mg Oral Q4H PRN Costella, Vista Mink, PA-C      . scopolamine (TRANSDERM-SCOP) 1 MG/3DAYS 1.5 mg  1 patch Transdermal Q72H Agarwala, Ravi, MD   1.5 mg at 12/11/19 1513  . sodium chloride flush (NS) 0.9 % injection 10-40 mL  10-40 mL Intracatheter Q12H [name], MD   10 mL at 12/11/19 0944  . sodium chloride flush (NS) 0.9 % injection 10-40 mL  10-40 mL Intracatheter PRN [name], MD         Discharge Medications: Please see discharge summary for a list of discharge medications.  Relevant Imaging Results:  Relevant Lab Results:   Additional Information SSN [id number]  Arvella Merles, [place]

## 2019-12-12 DIAGNOSIS — J9602 Acute respiratory failure with hypercapnia: Secondary | ICD-10-CM | POA: Diagnosis not present

## 2019-12-12 DIAGNOSIS — I609 Nontraumatic subarachnoid hemorrhage, unspecified: Secondary | ICD-10-CM | POA: Diagnosis not present

## 2019-12-12 DIAGNOSIS — J9601 Acute respiratory failure with hypoxia: Secondary | ICD-10-CM | POA: Diagnosis not present

## 2019-12-12 DIAGNOSIS — I6012 Nontraumatic subarachnoid hemorrhage from left middle cerebral artery: Secondary | ICD-10-CM | POA: Diagnosis not present

## 2019-12-12 LAB — CBC
HCT: 27.2 % — ABNORMAL LOW (ref 36.0–46.0)
Hemoglobin: 8.4 g/dL — ABNORMAL LOW (ref 12.0–15.0)
MCH: 30.4 pg (ref 26.0–34.0)
MCHC: 30.9 g/dL (ref 30.0–36.0)
MCV: 98.6 fL (ref 80.0–100.0)
Platelets: 421 10*3/uL — ABNORMAL HIGH (ref 150–400)
RBC: 2.76 MIL/uL — ABNORMAL LOW (ref 3.87–5.11)
RDW: 14.9 % (ref 11.5–15.5)
WBC: 8.4 10*3/uL (ref 4.0–10.5)
nRBC: 0.4 % — ABNORMAL HIGH (ref 0.0–0.2)

## 2019-12-12 LAB — GLUCOSE, CAPILLARY
Glucose-Capillary: 176 mg/dL — ABNORMAL HIGH (ref 70–99)
Glucose-Capillary: 193 mg/dL — ABNORMAL HIGH (ref 70–99)
Glucose-Capillary: 200 mg/dL — ABNORMAL HIGH (ref 70–99)
Glucose-Capillary: 251 mg/dL — ABNORMAL HIGH (ref 70–99)
Glucose-Capillary: 267 mg/dL — ABNORMAL HIGH (ref 70–99)
Glucose-Capillary: 84 mg/dL (ref 70–99)

## 2019-12-12 LAB — BASIC METABOLIC PANEL
Anion gap: 8 (ref 5–15)
Anion gap: 9 (ref 5–15)
BUN: 56 mg/dL — ABNORMAL HIGH (ref 8–23)
BUN: 58 mg/dL — ABNORMAL HIGH (ref 8–23)
CO2: 17 mmol/L — ABNORMAL LOW (ref 22–32)
CO2: 14 mmol/L — ABNORMAL LOW (ref 22–32)
Calcium: 9.5 mg/dL (ref 8.9–10.3)
Calcium: 9.3 mg/dL (ref 8.9–10.3)
Chloride: 121 mmol/L — ABNORMAL HIGH (ref 98–111)
Chloride: 123 mmol/L — ABNORMAL HIGH (ref 98–111)
Creatinine, Ser: 1.34 mg/dL — ABNORMAL HIGH (ref 0.44–1.00)
Creatinine, Ser: 1.26 mg/dL — ABNORMAL HIGH (ref 0.44–1.00)
GFR calc Af Amer: 46 mL/min — ABNORMAL LOW (ref >60)
GFR calc Af Amer: 50 mL/min — ABNORMAL LOW (ref >60)
GFR calc non Af Amer: 40 mL/min — ABNORMAL LOW (ref >60)
GFR calc non Af Amer: 43 mL/min — ABNORMAL LOW (ref >60)
Glucose, Bld: 246 mg/dL — ABNORMAL HIGH (ref 70–99)
Glucose, Bld: 186 mg/dL — ABNORMAL HIGH (ref 70–99)
Potassium: 5.6 mmol/L — ABNORMAL HIGH (ref 3.5–5.1)
Potassium: 5.5 mmol/L — ABNORMAL HIGH (ref 3.5–5.1)
Sodium: 146 mmol/L — ABNORMAL HIGH (ref 135–145)
Sodium: 146 mmol/L — ABNORMAL HIGH (ref 135–145)

## 2019-12-12 MED ORDER — FREE WATER
300.0000 mL | Freq: Four times a day (QID) | Status: DC
  Administered 2019-12-12 – 2019-12-25 (×48): 300 mL

## 2019-12-12 MED ORDER — MELATONIN 3 MG PO TABS
3.0000 mg | ORAL_TABLET | Freq: Every day | ORAL | Status: DC
  Administered 2019-12-12 – 2019-12-23 (×12): 3 mg via ORAL
  Filled 2019-12-12 (×13): qty 1

## 2019-12-12 MED ORDER — CHLORHEXIDINE GLUCONATE 0.12 % MT SOLN
OROMUCOSAL | Status: AC
  Filled 2019-12-12: qty 15

## 2019-12-12 MED ORDER — INSULIN ASPART 100 UNIT/ML ~~LOC~~ SOLN
4.0000 [IU] | SUBCUTANEOUS | Status: DC
  Administered 2019-12-12 – 2019-12-15 (×10): 4 [IU] via SUBCUTANEOUS

## 2019-12-12 MED ORDER — INSULIN ASPART 100 UNIT/ML ~~LOC~~ SOLN
0.0000 [IU] | SUBCUTANEOUS | Status: DC
  Administered 2019-12-12 – 2019-12-13 (×2): 3 [IU] via SUBCUTANEOUS
  Administered 2019-12-13: 2 [IU] via SUBCUTANEOUS
  Administered 2019-12-13: 3 [IU] via SUBCUTANEOUS
  Administered 2019-12-13: 2 [IU] via SUBCUTANEOUS
  Administered 2019-12-13 – 2019-12-14 (×2): 3 [IU] via SUBCUTANEOUS
  Administered 2019-12-14: 5 [IU] via SUBCUTANEOUS
  Administered 2019-12-15 (×2): 2 [IU] via SUBCUTANEOUS
  Administered 2019-12-15: 3 [IU] via SUBCUTANEOUS
  Administered 2019-12-16 (×3): 2 [IU] via SUBCUTANEOUS
  Administered 2019-12-16: 3 [IU] via SUBCUTANEOUS
  Administered 2019-12-17: 2 [IU] via SUBCUTANEOUS
  Administered 2019-12-17 – 2019-12-18 (×2): 5 [IU] via SUBCUTANEOUS
  Administered 2019-12-18: 3 [IU] via SUBCUTANEOUS
  Administered 2019-12-18 – 2019-12-19 (×3): 2 [IU] via SUBCUTANEOUS
  Administered 2019-12-19 (×2): 3 [IU] via SUBCUTANEOUS
  Administered 2019-12-19 – 2019-12-20 (×4): 2 [IU] via SUBCUTANEOUS
  Administered 2019-12-21: 5 [IU] via SUBCUTANEOUS

## 2019-12-12 MED ORDER — SODIUM CHLORIDE 0.9 % IV BOLUS
500.0000 mL | Freq: Once | INTRAVENOUS | Status: AC
  Administered 2019-12-12: 500 mL via INTRAVENOUS

## 2019-12-12 NOTE — Progress Notes (Signed)
  NEUROSURGERY PROGRESS NOTE   No issues overnight.   EXAM:  BP (!) 168/76   Pulse (!) 110   Temp 100.2 F (37.9 C) (Axillary)   Resp (!) 39   Ht 5\' 6"  (1.676 m)   Wt 83.2 kg   SpO2 100%   BMI 29.61 kg/m   Opens eyes to voice Breathing spontaneously on trach collar Follows simple commands  IMPRESSION:  69 y.o. female s/p SAH >2wks, neurologically stable. Doing well on trach collar, has some secretions.  PLAN: - Transfer to floor when appropriate per PCCM - Will need placement SNF v LTACH

## 2019-12-12 NOTE — Progress Notes (Signed)
Inpatient Diabetes Program Recommendations  AACE/ADA: New Consensus Statement on Inpatient Glycemic Control (2015)  Target Ranges:  Prepandial:   less than 140 mg/dL      Peak postprandial:   less than 180 mg/dL (1-2 hours)      Critically ill patients:  140 - 180 mg/dL   Lab Results  Component Value Date   GLUCAP 267 (H) 12/12/2019   HGBA1C 7.8 (H) 11/21/2019    Review of Glycemic Control Results for Rebecca Green, SCHRUPP (MRN [id number]) as of 12/12/2019 12:01  Ref. Range 12/11/2019 23:25 12/12/2019 03:38 12/12/2019 07:59 12/12/2019 11:46  Glucose-Capillary Latest Ref Range: 70 - 99 mg/dL 118 (H) 176 (H) 251 (H) 267 (H)   Current orders for Inpatient glycemic control:  Levemir 12 units Daily Novolog 0-9 units Q4 ours Novolog 2 units Q4 hours Tube Feed Coverage  Jevity 1.2 50 ml/hour  Inpatient Diabetes Program Recommendations:    -  Increase Novolog Tube Feed Coverage to 4-5 units Q4 hours (Do not give if tube feeds are stopped or held). Tube Feeds back at goal.  Thanks,  Tama Headings RN, MSN, BC-ADM Inpatient Diabetes Coordinator Team Pager [phone] (8a-5p)

## 2019-12-12 NOTE — Progress Notes (Signed)
NAME:  Rebecca Green, MRN:  [id number], DOB:  September 09, 1950, LOS: 23 ADMISSION DATE:  11/19/2019, CONSULTATION DATE:  11/20/19 REFERRING MD:  Zada Finders, CHIEF COMPLAINT:  SAH    Brief History   69 year old female with PMH significant for seizures, previous CVA's, HTN, and known left MCA aneurysm who reports for sudden unresponsiveness, left-sided facial droop, and right upper extremity weakness. CT Head revealed large volume left sided SAH with 43mm midline shift.   Past Medical History  CVA (No residual deficits) HTN Seizures DM II   Significant Hospital Events   6/06 [facility] 6/07 Aneurysm clipped. Acute decompensation - right frontal EVD placed. Intubated for airway protection 6/11 fever 101.3 6/12 change to pressure control 6/14 on PS wean 6/17 Trach Placed   Consults:  Neurology Neurosurgery  Procedures:  6/7 EVD >>6/27 6/7 ETT > 6/17 6/7 Left pterional craniotomy for clipping of MCA aneurysm 6/17 Trach >>   Significant Diagnostic Tests:  6/7 CT Head > Large volume SAH overlying left cerebral hemisphere with  left to right midline shift. New hydrocephalus of lateral and third vents. SAH reflux into occipital horns of lateral vents.  6/6 CTA Neck > Patent common and internal carotids without significant stenosis. Mixed plaque within proximal left ICA. Patent vertebrals.  6/6 CTA Head > 3x2 mm left MCA bifurcation aneurysm (increased in size from April 2020). No intracranial LVO. High grade focal stenosis within inferior division mid M2 left MCA 6/9 renal u/s > Right renal cysts without complicating factors.  No hydronephrosis. 6/19 renal u/s> No hydronephrosis or shadowing stone. 2. Stable right renal upper pole cyst.  Micro Data:  6/6 SARS CoV2 > Negative 6/8 resp >  Normal oral flora 6/17 resp > Few Staph Epidermidis > resistant to erythromycin, oxacillin, and Bactrim 6/19 blood cultures 1>   Antimicrobials:  6/7 Ancef x1 6/7 Rocephin > 6/10 Ancef  6/18 x 1 6/18 vancomycin 6/20 6/20 Zyvox 6/22  Interim history/subjective:   Increased secretions since cough deflated.  Objective   Blood pressure (!) 153/70, pulse (!) 109, temperature 100.2 F (37.9 C), temperature source Axillary, resp. rate (!) 30, height 5\' 6"  (1.676 m), weight 83.2 kg, SpO2 98 %.    FiO2 (%):  [35 %] 35 %   Intake/Output Summary (Last 24 hours) at 12/12/2019 1305 Last data filed at 12/12/2019 1000 Gross per 24 hour  Intake 1180 ml  Output 1300 ml  Net -120 ml   Filed Weights   12/10/19 0457 12/11/19 0347 12/12/19 0355  Weight: 81.1 kg 81.3 kg 83.2 kg    Examination: General: elderly, chronically ill, trach in place HEENT: trach in place  Neuro: somnolent, moves spontaneously but not following commands  CV: RRR, s1 s2 PULM: chest clear but significant secretions.  Mild tachypnea. SV:XBLT, nt nd  Extremities: BL UE edema  Skin: no rash   Resolved Hospital Problem list   Hyponatremia Hyperchloremia Hyperkalemia Hyperphosphatemia  Assessment & Plan:   Acute hypoxic respiratory failure with compromised airway in setting of SAH.  Tolerating trach collar but suctioning requirements preclude transfer. -Scopolamine patch to dry up oral secretions.   Acute left-sided SAH with hydrocephalus,  Status post clipping, status post EVD removal. --Continue to observe for neurological recovery  Ongoing aspiration risk due to mental status. -Aggressive pulmonary toilet.  RUE DVT  -Rescan right upper extremity. -If clot still present, would be appropriate for anticoagulation at this time.   Best practice:  Diet: Tube feeds Pain/Anxiety/Delirium  protocol (if indicated): n/a VAP protocol: if indicated  DVT prophylaxis: Add Lovenox when OK with NSG GI prophylaxis: pepcid  Glucose control: SSI, Levemir Mobility: Bed rest Code Status: Full code Family Communication: Will updated on plan of care.  Disposition: Continue ICU Care.   Labs    CBC: Recent Labs  Lab 12/06/19 0935 12/08/19 0435 12/10/19 0438 12/12/19 0550  WBC 8.9 10.4 10.1 8.4  HGB 7.9* 8.4* 7.6* 8.4*  HCT 25.4* 26.7* 24.6* 27.2*  MCV 96.9 96.7 99.2 98.6  PLT 488* 485* 393 421*    Basic Metabolic Panel: Recent Labs  Lab 12/06/19 0935 12/08/19 0435 12/10/19 0438 12/12/19 0550  NA 143 142 145 146*  K 4.4 4.5 5.0 5.6*  CL 112* 117* 119* 121*  CO2 20* 14* 15* 17*  GLUCOSE 185* 251* 246* 246*  BUN 71* 56* 73* 56*  CREATININE 1.51* 1.36* 1.54* 1.34*  CALCIUM 9.3 9.5 9.4 9.5   GFR: Estimated Creatinine Clearance: 42.5 mL/min (A) (by C-G formula based on SCr of 1.34 mg/dL (H)). Recent Labs  Lab 12/06/19 0935 12/08/19 0435 12/10/19 0438 12/12/19 0550  WBC 8.9 10.4 10.1 8.4    Liver Function Tests: No results for input(s): AST, ALT, ALKPHOS, BILITOT, PROT, ALBUMIN in the last 168 hours. No results for input(s): LIPASE, AMYLASE in the last 168 hours. No results for input(s): AMMONIA in the last 168 hours.  ABG    Component Value Date/Time   PHART 7.442 12/05/2019 0300   PCO2ART 32.3 12/05/2019 0300   PO2ART 109 (H) 12/05/2019 0300   HCO3 21.8 12/05/2019 0300   TCO2 23 12/05/2019 0300   ACIDBASEDEF 2.0 12/05/2019 0300   O2SAT 98.0 12/05/2019 0300     Coagulation Profile: No results for input(s): INR, PROTIME in the last 168 hours.  Cardiac Enzymes: No results for input(s): CKTOTAL, CKMB, CKMBINDEX, TROPONINI in the last 168 hours.  HbA1C: Hgb A1c MFr Bld  Date/Time Value Ref Range Status  11/21/2019 10:55 AM 7.8 (H) 4.8 - 5.6 % Final    Comment:    (NOTE) Pre diabetes:          5.7%-6.4% Diabetes:              >6.4% Glycemic control for   <7.0% adults with diabetes     CBG: Recent Labs  Lab 12/11/19 1939 12/11/19 2325 12/12/19 0338 12/12/19 0759 12/12/19 1146  GLUCAP 140* [place], MD [facility] ICU Physician [place]  Pager: [phone] Mobile: [phone] After  hours: [phone].   12/12/2019 1:05 PM

## 2019-12-13 ENCOUNTER — Inpatient Hospital Stay (HOSPITAL_COMMUNITY): Payer: Medicare Other

## 2019-12-13 DIAGNOSIS — I609 Nontraumatic subarachnoid hemorrhage, unspecified: Secondary | ICD-10-CM | POA: Diagnosis not present

## 2019-12-13 DIAGNOSIS — Z9889 Other specified postprocedural states: Secondary | ICD-10-CM

## 2019-12-13 DIAGNOSIS — J9602 Acute respiratory failure with hypercapnia: Secondary | ICD-10-CM | POA: Diagnosis not present

## 2019-12-13 DIAGNOSIS — I6012 Nontraumatic subarachnoid hemorrhage from left middle cerebral artery: Secondary | ICD-10-CM | POA: Diagnosis not present

## 2019-12-13 DIAGNOSIS — J9601 Acute respiratory failure with hypoxia: Secondary | ICD-10-CM | POA: Diagnosis not present

## 2019-12-13 LAB — GLUCOSE, CAPILLARY
Glucose-Capillary: 138 mg/dL — ABNORMAL HIGH (ref 70–99)
Glucose-Capillary: 142 mg/dL — ABNORMAL HIGH (ref 70–99)
Glucose-Capillary: 165 mg/dL — ABNORMAL HIGH (ref 70–99)
Glucose-Capillary: 194 mg/dL — ABNORMAL HIGH (ref 70–99)
Glucose-Capillary: 71 mg/dL (ref 70–99)
Glucose-Capillary: 88 mg/dL (ref 70–99)

## 2019-12-13 MED ORDER — WHITE PETROLATUM EX OINT
TOPICAL_OINTMENT | CUTANEOUS | Status: AC
  Filled 2019-12-13: qty 28.35

## 2019-12-13 MED ORDER — AMLODIPINE BESYLATE 5 MG PO TABS
5.0000 mg | ORAL_TABLET | Freq: Every day | ORAL | Status: DC
  Administered 2019-12-13 – 2019-12-29 (×17): 5 mg
  Filled 2019-12-13 (×17): qty 1

## 2019-12-13 MED ORDER — INSULIN DETEMIR 100 UNIT/ML ~~LOC~~ SOLN
14.0000 [IU] | Freq: Every day | SUBCUTANEOUS | Status: DC
  Administered 2019-12-13 – 2019-12-15 (×3): 14 [IU] via SUBCUTANEOUS
  Filled 2019-12-13 (×3): qty 0.14

## 2019-12-13 MED ORDER — ENOXAPARIN SODIUM 80 MG/0.8ML ~~LOC~~ SOLN
80.0000 mg | Freq: Two times a day (BID) | SUBCUTANEOUS | Status: DC
  Administered 2019-12-13 – 2019-12-25 (×24): 80 mg via SUBCUTANEOUS
  Filled 2019-12-13 (×28): qty 0.8

## 2019-12-13 NOTE — Progress Notes (Signed)
RN rounded with Neurosurgery.  BP goal systolic less than 968 and patient can be q4 blood pressure checks.

## 2019-12-13 NOTE — Progress Notes (Signed)
LT Upper Extremity Venous study completed.   See Cv Proc for preliminary results.   Rebecca Green

## 2019-12-13 NOTE — Progress Notes (Signed)
SLP Cancellation Note  Patient Details Name: Rebecca Green MRN: [id number] DOB: 04/05/1951   Cancelled treatment:       Reason Eval/Treat Not Completed: Patient at procedure or test/unavailable (having other testing done in room). Will f/u as able.    Osie Bond., M.A. [place] [facility] Pager [phone] Office [phone]  12/13/2019, 12:00 PM

## 2019-12-13 NOTE — Progress Notes (Signed)
  NEUROSURGERY PROGRESS NOTE   No issues overnight.  EXAM:  BP 140/78   Pulse 81   Temp 98 F (36.7 C) (Axillary)   Resp (!) 24   Ht 5\' 6"  (1.676 m)   Wt 78.2 kg   SpO2 97%   BMI 27.83 kg/m   Opens eyes to voice Tach collar in place, breathing spontaneously  Follows simple commands intermittently  IMPRESSION/PLAN 69 y.o. female s/p SAH >2wks, neurologically stable. Doing well on trach collar, has some secretions. - Will need placement SNF v LTACH

## 2019-12-13 NOTE — Progress Notes (Signed)
  Speech Language Pathology Treatment: [name] Speaking valve  Patient Details Name: Rebecca Green MRN: [id number] DOB: [birth year] Today's Date: 12/13/2019 Time: 6168-3729 SLP Time Calculation (min) (ACUTE ONLY): 12 min  Assessment / Plan / Recommendation Clinical Impression  Pt's RR hovered around 30 at baseline, and RN reports having to suction ~4 times today, with secretions seemingly increasing as the day goes on. SLP deflated cuff and PMV was donned intermittently throughout session with RR increasing up toward the high 30s. Pt appeared to have increased WOB and began reaching up toward valve prior to doffing by SLP. RR returned to baseline upon removal. Of note, there did not appear to be as much back pressure noted upon removal today. She did not make any attempts to vocalize, verbalize, or repeat despite cues while valve was in place. She did follow a few one-step commands though. Suspect that she will need a cuffless and/or smaller trach before she can use PMV. Given limited ability to use PMV and presence of oral secretions pooling in mouth, swallow evaluation was deferred for today but will continue to follow for readiness.   HPI HPI: Pt is a 69 yo female presenting wiht AMS, L facial droop, and RUE weakness. CT head revealed large volume left sided SAH with 18mm midline shift s/p aneurysm clipping and EVD placement 6/7 (EVD d/c 6/27). She was intubated 6/7; trach placed 6/17. PEG 6/24. PMH includes: CVA (no residual deficits), seizures, HTN, DM II, L MCA aneurysm      SLP Plan  Continue with current plan of care       Recommendations         Patient may use Passy-Muir Speech Valve: with SLP only MD: Please consider changing trach tube to : Smaller size;Cuffless         Oral Care Recommendations: Oral care QID Follow up Recommendations: Skilled Nursing facility SLP Visit Diagnosis: Aphonia (R49.1) Plan: Continue with current plan of care       GO                 Osie Bond., M.A. [place] [facility] Pager [phone] Office [phone]  12/13/2019, 2:59 PM

## 2019-12-13 NOTE — Progress Notes (Signed)
NAME:  Rebecca Green, MRN:  [id number], DOB:  May 24, 1951, LOS: 24 ADMISSION DATE:  11/19/2019, CONSULTATION DATE:  11/20/19 REFERRING MD:  Zada Finders, CHIEF COMPLAINT:  SAH    Brief History   69 year old female with PMH significant for seizures, previous CVA's, HTN, and known left MCA aneurysm who reports for sudden unresponsiveness, left-sided facial droop, and right upper extremity weakness. CT Head revealed large volume left sided SAH with 22mm midline shift.   Past Medical History  CVA (No residual deficits) HTN Seizures DM II   Significant Hospital Events   6/06 Admitted to [facility] 6/07 Aneurysm clipped. Acute decompensation - right frontal EVD placed. Intubated for airway protection 6/11 fever 101.3 6/12 change to pressure control 6/14 on PS wean 6/17 Trach Placed   Consults:  Neurology Neurosurgery  Procedures:  6/7 EVD >>6/27 6/7 ETT > 6/17 6/7 Left pterional craniotomy for clipping of MCA aneurysm 6/17 Trach >>   Significant Diagnostic Tests:  6/7 CT Head > Large volume SAH overlying left cerebral hemisphere with  left to right midline shift. New hydrocephalus of lateral and third vents. SAH reflux into occipital horns of lateral vents.  6/6 CTA Neck > Patent common and internal carotids without significant stenosis. Mixed plaque within proximal left ICA. Patent vertebrals.  6/6 CTA Head > 3x2 mm left MCA bifurcation aneurysm (increased in size from April 2020). No intracranial LVO. High grade focal stenosis within inferior division mid M2 left MCA 6/9 renal u/s > Right renal cysts without complicating factors.  No hydronephrosis. 6/19 renal u/s> No hydronephrosis or shadowing stone. 2. Stable right renal upper pole cyst.  Micro Data:  6/6 SARS CoV2 > Negative 6/8 resp >  Normal oral flora 6/17 resp > Few Staph Epidermidis > resistant to erythromycin, oxacillin, and Bactrim 6/19 blood cultures 1>   Antimicrobials:  6/7 Ancef x1 6/7 Rocephin > 6/10 Ancef  6/18 x 1 6/18 vancomycin 6/20 6/20 Zyvox 6/22  Interim history/subjective:   Continue to monitor on trach collar for secretions.  Objective   Blood pressure (!) 155/83, pulse 90, temperature 98 F (36.7 C), temperature source Axillary, resp. rate (!) 23, height 5\' 6"  (1.676 m), weight 78.2 kg, SpO2 100 %.    Vent Mode: Stand-by FiO2 (%):  [28 %-35 %] 28 %   Intake/Output Summary (Last 24 hours) at 12/13/2019 0818 Last data filed at 12/13/2019 0800 Gross per 24 hour  Intake 2003.49 ml  Output 600 ml  Net 1403.49 ml   Filed Weights   12/11/19 0347 12/12/19 0355 12/13/19 0500  Weight: 81.3 kg 83.2 kg 78.2 kg    Examination: General elderly female who is poorly responsive Tracheostomy in place still with copious secretions Feedings per PEG Extremities with edema   Resolved Hospital Problem list   Hyponatremia Hyperchloremia Hyperkalemia Hyperphosphatemia  Assessment & Plan:   Acute hypoxic respiratory failure with compromised airway in setting of SAH.  Tolerating trach collar but suctioning requirements preclude transfer. Continue scalloping patch for copious secretions Continue to monitor in ICU approaching time to transfer to stepdown   Acute left-sided SAH with hydrocephalus,  Status post clipping, status post EVD removal. Continue to monitor neurosurgery is following  Ongoing aspiration risk due to mental status. Continue pulmonary toilet  RUE DVT  Rescan right upper extremity ordered on 12/12/2019 has not been done as of 12/13/2019 If clot present may need to be restarted on anticoagulation    Best practice:  Diet: Tube feeds Pain/Anxiety/Delirium protocol (  if indicated): n/a VAP protocol: if indicated  DVT prophylaxis: Add Lovenox when OK with NSG GI prophylaxis: pepcid  Glucose control: SSI, Levemir Mobility: Bed rest Code Status: Full code Family Communication: Will updated on plan of care.  Disposition: Continue ICU Care.   Labs   CBC: Recent  Labs  Lab 12/06/19 0935 12/08/19 0435 12/10/19 0438 12/12/19 0550  WBC 8.9 10.4 10.1 8.4  HGB 7.9* 8.4* 7.6* 8.4*  HCT 25.4* 26.7* 24.6* 27.2*  MCV 96.9 96.7 99.2 98.6  PLT 488* 485* 393 421*    Basic Metabolic Panel: Recent Labs  Lab 12/06/19 0935 12/08/19 0435 12/10/19 0438 12/12/19 0550 12/12/19 1453  NA 143 142 145 146* 146*  K 4.4 4.5 5.0 5.6* 5.5*  CL 112* 117* 119* 121* 123*  CO2 20* 14* 15* 17* 14*  GLUCOSE 185* 251* 246* 246* 186*  BUN 71* 56* 73* 56* 58*  CREATININE 1.51* 1.36* 1.54* 1.34* 1.26*  CALCIUM 9.3 9.5 9.4 9.5 9.3   GFR: Estimated Creatinine Clearance: 43.9 mL/min (A) (by C-G formula based on SCr of 1.26 mg/dL (H)). Recent Labs  Lab 12/06/19 0935 12/08/19 0435 12/10/19 0438 12/12/19 0550  WBC 8.9 10.4 10.1 8.4    Liver Function Tests: No results for input(s): AST, ALT, ALKPHOS, BILITOT, PROT, ALBUMIN in the last 168 hours. No results for input(s): LIPASE, AMYLASE in the last 168 hours. No results for input(s): AMMONIA in the last 168 hours.  ABG    Component Value Date/Time   PHART 7.442 12/05/2019 0300   PCO2ART 32.3 12/05/2019 0300   PO2ART 109 (H) 12/05/2019 0300   HCO3 21.8 12/05/2019 0300   TCO2 23 12/05/2019 0300   ACIDBASEDEF 2.0 12/05/2019 0300   O2SAT 98.0 12/05/2019 0300     Coagulation Profile: No results for input(s): INR, PROTIME in the last 168 hours.  Cardiac Enzymes: No results for input(s): CKTOTAL, CKMB, CKMBINDEX, TROPONINI in the last 168 hours.  HbA1C: Hgb A1c MFr Bld  Date/Time Value Ref Range Status  11/21/2019 10:55 AM 7.8 (H) 4.8 - 5.6 % Final    Comment:    (NOTE) Pre diabetes:          5.7%-6.4% Diabetes:              >6.4% Glycemic control for   <7.0% adults with diabetes     CBG: Recent Labs  Lab 12/12/19 1607 12/12/19 1932 12/12/19 2338 12/13/19 0336 12/13/19 0806  GLUCAP 193* 84 200* 165* 194*    Steve Minor ACNP Acute Care Nurse Practitioner [place]  Please consult [place] 12/13/2019, 8:18 AM

## 2019-12-14 ENCOUNTER — Inpatient Hospital Stay (HOSPITAL_COMMUNITY): Payer: Medicare Other

## 2019-12-14 DIAGNOSIS — I609 Nontraumatic subarachnoid hemorrhage, unspecified: Secondary | ICD-10-CM | POA: Diagnosis present

## 2019-12-14 DIAGNOSIS — G9349 Other encephalopathy: Secondary | ICD-10-CM | POA: Diagnosis not present

## 2019-12-14 DIAGNOSIS — I509 Heart failure, unspecified: Secondary | ICD-10-CM | POA: Diagnosis not present

## 2019-12-14 DIAGNOSIS — G40909 Epilepsy, unspecified, not intractable, without status epilepticus: Secondary | ICD-10-CM | POA: Diagnosis not present

## 2019-12-14 DIAGNOSIS — J9621 Acute and chronic respiratory failure with hypoxia: Secondary | ICD-10-CM | POA: Diagnosis not present

## 2019-12-14 DIAGNOSIS — E871 Hypo-osmolality and hyponatremia: Secondary | ICD-10-CM | POA: Diagnosis not present

## 2019-12-14 DIAGNOSIS — N179 Acute kidney failure, unspecified: Secondary | ICD-10-CM | POA: Diagnosis not present

## 2019-12-14 DIAGNOSIS — I130 Hypertensive heart and chronic kidney disease with heart failure and stage 1 through stage 4 chronic kidney disease, or unspecified chronic kidney disease: Secondary | ICD-10-CM | POA: Diagnosis not present

## 2019-12-14 DIAGNOSIS — G8331 Monoplegia, unspecified affecting right dominant side: Secondary | ICD-10-CM | POA: Diagnosis not present

## 2019-12-14 DIAGNOSIS — R4701 Aphasia: Secondary | ICD-10-CM | POA: Diagnosis not present

## 2019-12-14 DIAGNOSIS — I6200 Nontraumatic subdural hemorrhage, unspecified: Secondary | ICD-10-CM | POA: Diagnosis not present

## 2019-12-14 DIAGNOSIS — Z20822 Contact with and (suspected) exposure to covid-19: Secondary | ICD-10-CM | POA: Diagnosis not present

## 2019-12-14 DIAGNOSIS — G911 Obstructive hydrocephalus: Secondary | ICD-10-CM | POA: Diagnosis not present

## 2019-12-14 DIAGNOSIS — I6012 Nontraumatic subarachnoid hemorrhage from left middle cerebral artery: Secondary | ICD-10-CM | POA: Diagnosis not present

## 2019-12-14 DIAGNOSIS — J690 Pneumonitis due to inhalation of food and vomit: Secondary | ICD-10-CM | POA: Diagnosis not present

## 2019-12-14 DIAGNOSIS — I82623 Acute embolism and thrombosis of deep veins of upper extremity, bilateral: Secondary | ICD-10-CM | POA: Diagnosis not present

## 2019-12-14 DIAGNOSIS — E870 Hyperosmolality and hypernatremia: Secondary | ICD-10-CM | POA: Diagnosis not present

## 2019-12-14 DIAGNOSIS — J9602 Acute respiratory failure with hypercapnia: Secondary | ICD-10-CM | POA: Diagnosis not present

## 2019-12-14 DIAGNOSIS — N183 Chronic kidney disease, stage 3 unspecified: Secondary | ICD-10-CM | POA: Diagnosis not present

## 2019-12-14 DIAGNOSIS — J9601 Acute respiratory failure with hypoxia: Secondary | ICD-10-CM | POA: Diagnosis not present

## 2019-12-14 DIAGNOSIS — E872 Acidosis: Secondary | ICD-10-CM | POA: Diagnosis not present

## 2019-12-14 LAB — MAGNESIUM: Magnesium: 2 mg/dL (ref 1.7–2.4)

## 2019-12-14 LAB — BASIC METABOLIC PANEL
Anion gap: 7 (ref 5–15)
BUN: 46 mg/dL — ABNORMAL HIGH (ref 8–23)
CO2: 20 mmol/L — ABNORMAL LOW (ref 22–32)
Calcium: 9.3 mg/dL (ref 8.9–10.3)
Chloride: 119 mmol/L — ABNORMAL HIGH (ref 98–111)
Creatinine, Ser: 1.05 mg/dL — ABNORMAL HIGH (ref 0.44–1.00)
GFR calc Af Amer: >60 mL/min (ref >60)
GFR calc non Af Amer: 54 mL/min — ABNORMAL LOW (ref >60)
Glucose, Bld: 156 mg/dL — ABNORMAL HIGH (ref 70–99)
Potassium: 5.1 mmol/L (ref 3.5–5.1)
Sodium: 146 mmol/L — ABNORMAL HIGH (ref 135–145)

## 2019-12-14 LAB — GLUCOSE, CAPILLARY
Glucose-Capillary: 161 mg/dL — ABNORMAL HIGH (ref 70–99)
Glucose-Capillary: 66 mg/dL — ABNORMAL LOW (ref 70–99)
Glucose-Capillary: 157 mg/dL — ABNORMAL HIGH (ref 70–99)
Glucose-Capillary: 214 mg/dL — ABNORMAL HIGH (ref 70–99)
Glucose-Capillary: 80 mg/dL (ref 70–99)
Glucose-Capillary: 113 mg/dL — ABNORMAL HIGH (ref 70–99)
Glucose-Capillary: 149 mg/dL — ABNORMAL HIGH (ref 70–99)

## 2019-12-14 LAB — CBC
HCT: 26.5 % — ABNORMAL LOW (ref 36.0–46.0)
Hemoglobin: 8 g/dL — ABNORMAL LOW (ref 12.0–15.0)
MCH: 29.7 pg (ref 26.0–34.0)
MCHC: 30.2 g/dL (ref 30.0–36.0)
MCV: 98.5 fL (ref 80.0–100.0)
Platelets: 371 10*3/uL (ref 150–400)
RBC: 2.69 MIL/uL — ABNORMAL LOW (ref 3.87–5.11)
RDW: 14.7 % (ref 11.5–15.5)
WBC: 8.3 10*3/uL (ref 4.0–10.5)
nRBC: 0 % (ref 0.0–0.2)

## 2019-12-14 LAB — PHOSPHORUS: Phosphorus: 3.8 mg/dL (ref 2.5–4.6)

## 2019-12-14 MED ORDER — DEXTROSE 50 % IV SOLN
INTRAVENOUS | Status: AC
  Administered 2019-12-14: 25 mL
  Filled 2019-12-14: qty 50

## 2019-12-14 NOTE — Progress Notes (Signed)
  NEUROSURGERY PROGRESS NOTE   No issues overnight.    EXAM:  BP (!) 155/90   Pulse 98   Temp (!) 100.7 F (38.2 C) (Axillary)   Resp (!) 28   Ht 5\' 6"  (1.676 m)   Wt 76.8 kg   SpO2 98%   BMI 27.33 kg/m   Opens eyes to voice Tach collar in place Follows simple commands intermittently  IMPRESSION/PLAN 69 y.o. female s/p SAH >2wks. Neurologically stable. Doing well on trach collar - Will need placement SNF v LTACH

## 2019-12-14 NOTE — Progress Notes (Signed)
Nutrition Follow-up  DOCUMENTATION CODES:   Not applicable  INTERVENTION:   Tube feeding via PEG:  Jevity 1.2 @ 50 ml/h (1200 ml per day) Pro-stat 60 ml BID  Provides 1840 kcal, 126 gm protein, 968 ml free water daily  300 ml free water every 6 hours  Total free water: 2168 ml   NUTRITION DIAGNOSIS:   Inadequate oral intake related to inability to eat as evidenced by NPO status.  Ongoing.  GOAL:   Provide needs based on ASPEN/SCCM guidelines  Met with TF.   MONITOR:   TF tolerance, Vent status, Labs  REASON FOR ASSESSMENT:   Consult, Ventilator Enteral/tube feeding initiation and management  ASSESSMENT:   Pt with PMH of seizures, previous CVA's, HTN, DM, and known L MCA aneurysm who was admitted with large L SAH from ruptured L MCA aneurysm.  Pt remains on trach collar.   6/7 s/p crani for clipping and EVD placement  6/14 cortrak placed 6/17 trach 6/23 EVD clamped  Labs: CBGs [id number] Medications: Pepcid, Novolog 0-9 Units Q4H, 4 units novolog every 4 hours, Levemir 14 units, MVI   Diet Order:   Diet Order    None      EDUCATION NEEDS:   No education needs have been identified at this time  Skin:  Skin Assessment: Skin Integrity Issues: Skin Integrity Issues:: Incisions Incisions: head x2  Last BM:  6/30  Height:   Ht Readings from Last 1 Encounters:  11/19/19 '5\' 6"'  (1.676 m)    Weight:   Wt Readings from Last 1 Encounters:  12/14/19 76.8 kg    Ideal Body Weight:  59 kg  BMI:  Body mass index is 27.33 kg/m.  Estimated Nutritional Needs:   Kcal:  1800-2000  Protein:  110-125 grams  Fluid:  2 L/day  Lockie Pares., RD, LDN, CNSC See AMiON for contact information

## 2019-12-14 NOTE — Progress Notes (Signed)
Physical Therapy Treatment Patient Details Name: Rebecca Green MRN: [id number] DOB: 07/23/1950 Today's Date: 12/14/2019    History of Present Illness Pt is a 69 yo female with ho of HTN, CVAs and known L MCA aneurysm that became unresponsive on 6/6.  Pt now with L SAH with midline shift.  Pt had aneurysm clipped and ventriculostomy placed. Intubated 11/20/19 and trach placed on 6/17.  s/p G tube 12/07/19.    PT Comments    Pt only tolerated sitting EOB for ~10 mins prior to fatigue.  She is following some basic commands today with bil UE with structure, increased time to process and sometimes repetition.  She was positioned in high chair mode in the bed at the end of the session to simulate chair.  Next session we should try OOB to chair (would be safer to attempt standing from chair due to short stature).  PT will continue to follow acutely for safe mobility progression.    Follow Up Recommendations  LTACH;Other (comment) (vs SNF?)     Equipment Recommendations  Wheelchair (measurements PT);Wheelchair cushion (measurements PT);Hospital bed (hoyer lift)    Recommendations for Other Services Other (comment)     Precautions / Restrictions Precautions Precautions: Fall    Mobility  Bed Mobility Overal bed mobility: Needs Assistance Bed Mobility: Supine to Sit;Sit to Supine     Supine to sit: +2 for physical assistance;Max assist Sit to supine: +2 for physical assistance;Max assist   General bed mobility comments: Two person max assist to come to sitting EOB supporting legs and trunk for transition up and down.  Pt attempting to weakly help, HOB elevated maximally on the way up, flat on the way back to supine.          Balance Overall balance assessment: Needs assistance Sitting-balance support: Feet supported;Bilateral upper extremity supported;No upper extremity supported;Single extremity supported Sitting balance-Leahy Scale: Zero Sitting balance - Comments: need initially  max assist, but can be as good as mod assist to sit EOB.  Pt with right lateral lean.  Worked in sitting on midline posture, holding her neck up, and following basic commands.  Postural control: Left lateral lean (very rounded through trunk and posterior pelvic dump)                                  Cognition Arousal/Alertness: Awake/alert Behavior During Therapy: Flat affect Overall Cognitive Status: Impaired/Different from baseline Area of Impairment: Attention;Memory;Following commands;Safety/judgement;Awareness;Problem solving                   Current Attention Level: Focused   Following Commands: Follows one step commands inconsistently;Follows one step commands with increased time Safety/Judgement: Decreased awareness of safety;Decreased awareness of deficits Awareness: Intellectual Problem Solving: Slow processing;Decreased initiation General Comments: Pt following ~50% of commands today in structured environment, given time to process and at times needing to re-focus attention, increase arousal and repeat the command.       Exercises      General Comments General comments (skin integrity, edema, etc.): Left pt in high chair mode for the benefit of upright posture, proped on pillows for pressure relief and comfort.       Pertinent Vitals/Pain Pain Assessment: Faces Faces Pain Scale: No hurt           PT Goals (current goals can now be found in the care plan section) Acute Rehab PT Goals Patient Stated Goal: unable to  state Progress towards PT goals: Progressing toward goals    Frequency    Min 2X/week      PT Plan Frequency needs to be updated       AM-PAC PT "6 Clicks" Mobility   Outcome Measure  Help needed turning from your back to your side while in a flat bed without using bedrails?: Total Help needed moving from lying on your back to sitting on the side of a flat bed without using bedrails?: Total Help needed moving to and  from a bed to a chair (including a wheelchair)?: Total Help needed standing up from a chair using your arms (e.g., wheelchair or bedside chair)?: Total Help needed to walk in hospital room?: Total Help needed climbing 3-5 steps with a railing? : Total 6 Click Score: 6    End of Session Equipment Utilized During Treatment: Oxygen Activity Tolerance: Patient tolerated treatment well Patient left: in bed;with call bell/phone within reach;with bed alarm set Nurse Communication: Mobility status PT Visit Diagnosis: Unsteadiness on feet (R26.81);Muscle weakness (generalized) (M62.81);Difficulty in walking, not elsewhere classified (R26.2);Hemiplegia and hemiparesis Hemiplegia - Right/Left: Right Hemiplegia - caused by: Nontraumatic SAH     Time: 1210-1226 PT Time Calculation (min) (ACUTE ONLY): 16 min  Charges:  $Therapeutic Activity: 8-22 mins                    Verdene Lennert, PT, DPT  Acute Rehabilitation [phone] pager #[phone] office     12/14/2019, 6:31 PM

## 2019-12-14 NOTE — Progress Notes (Signed)
NAME:  Rebecca Green, MRN:  [id number], DOB:  1951-03-14, LOS: 25 ADMISSION DATE:  11/19/2019, CONSULTATION DATE:  11/20/19 REFERRING MD:  Zada Finders, CHIEF COMPLAINT:  SAH    Brief History   69 year old female with PMH significant for seizures, previous CVA's, HTN, and known left MCA aneurysm who reports for sudden unresponsiveness, left-sided facial droop, and right upper extremity weakness. CT Head revealed large volume left sided SAH with 26mm midline shift.   Past Medical History  CVA (No residual deficits) HTN Seizures DM II   Significant Hospital Events   6/06 Admitted to [facility] 6/07 Aneurysm clipped. Acute decompensation - right frontal EVD placed. Intubated for airway protection 6/11 fever 101.3 6/12 change to pressure control 6/14 on PS wean 6/17 Trach Placed   Consults:  Neurology Neurosurgery  Procedures:  6/7 EVD >>6/27 6/7 ETT > 6/17 6/7 Left pterional craniotomy for clipping of MCA aneurysm 6/17 Trach >>   Significant Diagnostic Tests:  6/7 CT Head > Large volume SAH overlying left cerebral hemisphere with  left to right midline shift. New hydrocephalus of lateral and third vents. SAH reflux into occipital horns of lateral vents.  6/6 CTA Neck > Patent common and internal carotids without significant stenosis. Mixed plaque within proximal left ICA. Patent vertebrals.  6/6 CTA Head > 3x2 mm left MCA bifurcation aneurysm (increased in size from April 2020). No intracranial LVO. High grade focal stenosis within inferior division mid M2 left MCA 6/9 renal u/s > Right renal cysts without complicating factors.  No hydronephrosis. 6/19 renal u/s> No hydronephrosis or shadowing stone. 2. Stable right renal upper pole cyst.  Micro Data:  6/6 SARS CoV2 > Negative 6/8 resp >  Normal oral flora 6/17 resp > Few Staph Epidermidis > resistant to erythromycin, oxacillin, and Bactrim 6/19 blood cultures 1>   Antimicrobials:  6/7 Ancef x1 6/7 Rocephin > 6/10 Ancef  6/18 x 1 6/18 vancomycin 6/20 6/20 Zyvox 6/22  Interim history/subjective:   Remains on trach collar, more awake, minimal suctioning requirements as managing to cough secretions.   Objective   Blood pressure (!) 144/72, pulse 95, temperature 99.4 F (37.4 C), temperature source Axillary, resp. rate (!) 31, height 5\' 6"  (1.676 m), weight 76.8 kg, SpO2 98 %.    FiO2 (%):  [28 %] 28 %   Intake/Output Summary (Last 24 hours) at 12/14/2019 0851 Last data filed at 12/14/2019 0629 Gross per 24 hour  Intake 2300 ml  Output 500 ml  Net 1800 ml   Filed Weights   12/12/19 0355 12/13/19 0500 12/14/19 0500  Weight: 83.2 kg 78.2 kg 76.8 kg    Examination: General elderly female awake. Tracheostomy in place decreasing clear secretions. Site intact Chest clear HS normal  Trace edema Awake and following simple commands. Moves purposefully.  Unable to phonate when tracheostomy occluded.   Resolved Hospital Problem list   Hyponatremia Hyperchloremia Hyperkalemia Hyperphosphatemia  Assessment & Plan:   Acute hypoxic respiratory failure with compromised airway in setting of SAH.  Secretions have improved and she is able to manage them adequately by coughing. -Ready to transfer to stepdown.  Remains encephalopathic secondary to subarachnoid hemorrhage. She has improved since starting melatonin. -Increase therapy as tolerated.  Acute left-sided SAH with hydrocephalus,  Status post clipping, status post EVD removal. Continue to monitor neurosurgery is following  RUE DVT  Have restarted anticoagulation for right upper extremity DVT as present on repeat scan. -Currently on Lovenox -If tolerates Lovenox with no  signs of bleeding can eventually transition to Eliquis.  Best practice:  Diet: Tube feeds Pain/Anxiety/Delirium protocol (if indicated): n/a VAP protocol: if indicated  DVT prophylaxis: Added Lovenox for DVT treatment.  Transition to Eliquis. GI prophylaxis: pepcid  Glucose  control: SSI, Levemir -type 2 diabetes well controlled. Mobility: Bed rest Code Status: Full code Family Communication: Family updated 6/29 Disposition: Transfer to PCU.  PCCM to continue to follow for tracheostomy care.  Labs   CBC: Recent Labs  Lab 12/08/19 0435 12/10/19 0438 12/12/19 0550 12/14/19 0540  WBC 10.4 10.1 8.4 8.3  HGB 8.4* 7.6* 8.4* 8.0*  HCT 26.7* 24.6* 27.2* 26.5*  MCV 96.7 99.2 98.6 98.5  PLT 485* 393 421* 379    Basic Metabolic Panel: Recent Labs  Lab 12/08/19 0435 12/10/19 0438 12/12/19 0550 12/12/19 1453 12/14/19 0540  NA 142 145 146* 146* 146*  K 4.5 5.0 5.6* 5.5* 5.1  CL 117* 119* 121* 123* 119*  CO2 14* 15* 17* 14* 20*  GLUCOSE 251* 246* 246* 186* 156*  BUN 56* 73* 56* 58* 46*  CREATININE 1.36* 1.54* 1.34* 1.26* 1.05*  CALCIUM 9.5 9.4 9.5 9.3 9.3  MG  --   --   --   --  2.0  PHOS  --   --   --   --  3.8   GFR: Estimated Creatinine Clearance: 52.2 mL/min (A) (by C-G formula based on SCr of 1.05 mg/dL (H)). Recent Labs  Lab 12/08/19 0435 12/10/19 0438 12/12/19 0550 12/14/19 0540  WBC 10.4 10.1 8.4 8.3    Liver Function Tests: No results for input(s): AST, ALT, ALKPHOS, BILITOT, PROT, ALBUMIN in the last 168 hours. No results for input(s): LIPASE, AMYLASE in the last 168 hours. No results for input(s): AMMONIA in the last 168 hours.  ABG    Component Value Date/Time   PHART 7.442 12/05/2019 0300   PCO2ART 32.3 12/05/2019 0300   PO2ART 109 (H) 12/05/2019 0300   HCO3 21.8 12/05/2019 0300   TCO2 23 12/05/2019 0300   ACIDBASEDEF 2.0 12/05/2019 0300   O2SAT 98.0 12/05/2019 0300     Coagulation Profile: No results for input(s): INR, PROTIME in the last 168 hours.  Cardiac Enzymes: No results for input(s): CKTOTAL, CKMB, CKMBINDEX, TROPONINI in the last 168 hours.  HbA1C: Hgb A1c MFr Bld  Date/Time Value Ref Range Status  11/21/2019 10:55 AM 7.8 (H) 4.8 - 5.6 % Final    Comment:    (NOTE) Pre diabetes:           5.7%-6.4% Diabetes:              >6.4% Glycemic control for   <7.0% adults with diabetes     CBG: Recent Labs  Lab 12/13/19 1503 12/13/19 1950 12/13/19 2335 12/14/19 0340 12/14/19 Viola [place], MD [facility] ICU Physician [place]  Pager: [phone] Mobile: [phone] After hours: [phone].  12/14/2019, 9:01 AM      12/14/2019, 8:51 AM

## 2019-12-14 NOTE — Progress Notes (Signed)
Inpatient Diabetes Program Recommendations  AACE/ADA: New Consensus Statement on Inpatient Glycemic Control (2015)  Target Ranges:  Prepandial:   less than 140 mg/dL      Peak postprandial:   less than 180 mg/dL (1-2 hours)      Critically ill patients:  140 - 180 mg/dL   Lab Results  Component Value Date   GLUCAP 66 (L) 12/14/2019   HGBA1C 7.8 (H) 11/21/2019    Review of Glycemic Control Results for MCKENZI, BUONOMO (MRN [id number]) as of 12/14/2019 09:15  Ref. Range 12/13/2019 03:36 12/13/2019 08:06 12/13/2019 11:48 12/13/2019 15:03 12/13/2019 19:50 12/13/2019 23:35 12/14/2019 03:40 12/14/2019 07:53  Glucose-Capillary Latest Ref Range: 70 - 99 mg/dL 165 (H)  Novolog 7 units 194 (H)  Novolog 7 units  Levemir 14 units  142 (H)  Novolog 6 units 71 138 (H)  Novolog 6 units 88 161 (H)  Novolog 7 units 66 (L)   Diabetes history: none  Current orders for Inpatient glycemic control:  Levemir 14 units Daily Novolog 0-15 units Q4 hours Novolog 4 units Q4 hours Tube Feed Coverage  A1c 7.8% on 6/8 Jevity 50 ml/hour  Inpatient Diabetes Program Recommendations:    Hypoglycemia on Novolog Correction and Tube feed Coverage.  -Novolog Correction scale to 0-9 units Q4 hours.  Thanks,  Tama Headings RN, MSN, BC-ADM Inpatient Diabetes Coordinator Team Pager [phone] (8a-5p)

## 2019-12-15 DIAGNOSIS — I609 Nontraumatic subarachnoid hemorrhage, unspecified: Secondary | ICD-10-CM | POA: Diagnosis not present

## 2019-12-15 DIAGNOSIS — I6012 Nontraumatic subarachnoid hemorrhage from left middle cerebral artery: Secondary | ICD-10-CM | POA: Diagnosis not present

## 2019-12-15 LAB — GLUCOSE, CAPILLARY
Glucose-Capillary: 100 mg/dL — ABNORMAL HIGH (ref 70–99)
Glucose-Capillary: 101 mg/dL — ABNORMAL HIGH (ref 70–99)
Glucose-Capillary: 102 mg/dL — ABNORMAL HIGH (ref 70–99)
Glucose-Capillary: 117 mg/dL — ABNORMAL HIGH (ref 70–99)
Glucose-Capillary: 129 mg/dL — ABNORMAL HIGH (ref 70–99)
Glucose-Capillary: 167 mg/dL — ABNORMAL HIGH (ref 70–99)
Glucose-Capillary: 50 mg/dL — ABNORMAL LOW (ref 70–99)

## 2019-12-15 MED ORDER — DEXTROSE 50 % IV SOLN
25.0000 mL | INTRAVENOUS | Status: DC | PRN
  Administered 2019-12-15 – 2019-12-27 (×4): 25 mL via INTRAVENOUS
  Filled 2019-12-15 (×4): qty 50

## 2019-12-15 MED ORDER — INSULIN ASPART 100 UNIT/ML ~~LOC~~ SOLN
2.0000 [IU] | SUBCUTANEOUS | Status: DC
  Administered 2019-12-15 – 2019-12-21 (×27): 2 [IU] via SUBCUTANEOUS

## 2019-12-15 MED ORDER — ORAL CARE MOUTH RINSE
15.0000 mL | Freq: Two times a day (BID) | OROMUCOSAL | Status: DC
  Administered 2019-12-15 – 2019-12-28 (×28): 15 mL via OROMUCOSAL

## 2019-12-15 MED ORDER — INSULIN DETEMIR 100 UNIT/ML ~~LOC~~ SOLN
10.0000 [IU] | Freq: Every day | SUBCUTANEOUS | Status: DC
  Administered 2019-12-16 – 2019-12-26 (×12): 10 [IU] via SUBCUTANEOUS
  Filled 2019-12-15 (×12): qty 0.1

## 2019-12-15 MED ORDER — CHLORHEXIDINE GLUCONATE 0.12 % MT SOLN
15.0000 mL | Freq: Two times a day (BID) | OROMUCOSAL | Status: DC
  Administered 2019-12-15 – 2019-12-29 (×28): 15 mL via OROMUCOSAL
  Filled 2019-12-15 (×27): qty 15

## 2019-12-15 NOTE — Progress Notes (Signed)
SLP Cancellation Note  Patient Details Name: Rebecca Green MRN: [id number] DOB: 21-Apr-1951   Cancelled treatment:       Reason Eval/Treat Not Completed: Other (comment) Pt receiving nursing care. Will f/u as able. Still suspect that pt will need a cuffless and/or smaller trach prior to being able to tolerate PMV.    Osie Bond., M.A. [facility] Pager [phone] Office [phone]  12/15/2019, 1:24 PM

## 2019-12-15 NOTE — Progress Notes (Signed)
  NEUROSURGERY PROGRESS NOTE   No issues overnight.   EXAM:  BP 139/82 (BP Location: Right Wrist)   Pulse 86   Temp 99.3 F (37.4 C) (Axillary)   Resp (!) 29   Ht 5\' 6"  (1.676 m)   Wt 76.8 kg   SpO2 97%   BMI 27.33 kg/m   Awakens to voice Trach in place, frothy secretions  Following commands by moving toes b/l  IMPRESSION/PLAN 69 y.o. female s/p SAH >2wks. Neurologically stable. Doing well on trach collar. - continue supportive care -Will need placement SNF v LTACH

## 2019-12-15 NOTE — Progress Notes (Signed)
Occupational Therapy Treatment Patient Details Name: Rebecca Green MRN: [id number] DOB: 11/18/1950 Today's Date: 12/15/2019    History of present illness Pt is a 69 yo female with ho of HTN, CVAs and known L MCA aneurysm that became unresponsive on 6/6.  Pt now with L SAH with midline shift.  Pt had aneurysm clipped and ventriculostomy placed. Intubated 11/20/19 and trach placed on 6/17.  s/p G tube 12/07/19.   OT comments  This 69 yo female admitted with above seen today to work on grooming, vision, movement of Bil UEs. Pt continues to be lethargic today, minimal eye opening with change of position in bed and to calling her name. When eyes were open she looked more readily to right than left. Pt resisting movement of her arms (wanting to keep them flexed and adducted (even when head was up and knees flexed in chair position of bed. We will continue to work with patient while she is on acute care. Goals updated this session.  Follow Up Recommendations  SNF;Supervision/Assistance - 24 hour    Equipment Recommendations  Other (comment) (TBD Next venue)       Precautions / Restrictions Precautions Precautions: Fall Restrictions Weight Bearing Restrictions: No              ADL either performed or assessed with clinical judgement   ADL Overall ADL's : Needs assistance/impaired     Grooming: Wash/dry face;Bed level Grooming Details (indicate cue type and reason): Placed warm washcloth in patient's left hand and she did not do anything with it, so I tried to hand over hand with her to wash her face, but she resisted. Then tried RUE as well with same result                                     Vision Baseline Vision/History: No visual deficits Vision Assessment?: Vision impaired- to be further tested in functional context Additional Comments: Pt with minimal eye opening today. She did open her eyes to her name being called a couple of times, but then did not after that.  When eyes were open she looke more readily right than left          Cognition Arousal/Alertness: Lethargic Behavior During Therapy: Flat affect Overall Cognitive Status: Impaired/Different from baseline Area of Impairment: Attention;Following commands                   Current Attention Level: Focused   Following Commands:  (not following commands)                Exercises Other Exercises Other Exercises: Attempted some PROM/AAROM of Bil UEs however pt resisting elbow extension (L >R, with left elbow being more tight)           Pertinent Vitals/ Pain       Pain Assessment: Faces Faces Pain Scale: No hurt (no signs of symptoms)         Frequency  Min 2X/week        Progress Toward Goals  OT Goals(current goals can now be found in the care plan section)  Progress towards OT goals: Not progressing toward goals - comment (remains total A for all movements/activities, lethargy)  Acute Rehab OT Goals Patient Stated Goal: unable to state OT Goal Formulation: Patient unable to participate in goal setting Time For Goal Achievement: 01/01/20 Potential to Achieve Goals: Poor  Plan Discharge plan  remains appropriate       AM-PAC OT "6 Clicks" Daily Activity     Outcome Measure   Help from another person eating meals?: Total Help from another person taking care of personal grooming?: Total Help from another person toileting, which includes using toliet, bedpan, or urinal?: Total Help from another person bathing (including washing, rinsing, drying)?: Total Help from another person to put on and taking off regular upper body clothing?: Total Help from another person to put on and taking off regular lower body clothing?: Total 6 Click Score: 6    End of Session Equipment Utilized During Treatment: Oxygen (5 liters 28%)  OT Visit Diagnosis: Other symptoms and signs involving the nervous system (R29.898);Other symptoms and signs involving cognitive  function;Hemiplegia and hemiparesis;Low vision, both eyes (H54.2) Hemiplegia - Right/Left: Right Hemiplegia - caused by: Nontraumatic SAH   Activity Tolerance Patient limited by lethargy   Patient Left in bed;with call bell/phone within reach;with restraints reapplied;with bed alarm set           Time: 7129-2909 OT Time Calculation (min): 14 min  Charges: OT General Charges $OT Visit: 1 Visit OT Treatments $Self Care/Home Management : 8-22 mins  [place] Circle, OTR/L Acute [employer] Pager [phone] Office [phone]      Almon Register 12/15/2019, 4:55 PM

## 2019-12-16 DIAGNOSIS — I6012 Nontraumatic subarachnoid hemorrhage from left middle cerebral artery: Secondary | ICD-10-CM | POA: Diagnosis not present

## 2019-12-16 DIAGNOSIS — I609 Nontraumatic subarachnoid hemorrhage, unspecified: Secondary | ICD-10-CM | POA: Diagnosis not present

## 2019-12-16 LAB — GLUCOSE, CAPILLARY
Glucose-Capillary: 119 mg/dL — ABNORMAL HIGH (ref 70–99)
Glucose-Capillary: 131 mg/dL — ABNORMAL HIGH (ref 70–99)
Glucose-Capillary: 141 mg/dL — ABNORMAL HIGH (ref 70–99)
Glucose-Capillary: 146 mg/dL — ABNORMAL HIGH (ref 70–99)
Glucose-Capillary: 161 mg/dL — ABNORMAL HIGH (ref 70–99)
Glucose-Capillary: 76 mg/dL (ref 70–99)

## 2019-12-16 NOTE — Progress Notes (Addendum)
Patient noted to be slumped sideways in bed. Assisted up x 2. Patient has wet gown from Jevity 1.2 infusing at 50cc/hr via g tube. Patient pads and gown changed. Pericare given. Perwick reinstalled. Patient has no evidence of learning to not slump in the bed due to aspiration precautions. This nurse will continue to monitor patient positioning while she has tubefeeding infusing. Trach suctioned with minimal secretions. Able to cough up some secretions. On 5L at 28%, sats 95%. Tolerated well.   0100- Patient laying sideways slumped in bed. Assisted and repositioned x 2. Patient alert and opens eyes. Nods appropriately. Bed alarm on.  0130- Patient given pericare due to incontinent episode in bed. Legs noted over side rail. Repositioned and perwick replaced. No distress. Trach suctioned as needed. On 5L at 28%, Sats up at 98%-100%. Tolerated well.  0354- Patient assisted up in bed again. Patient continues to throw legs onto right side rail while in bed. Bed alarm on for patient safety.  Bed in lowest position.  On 5L at 28% Patient sats 97-98%, coughs up moderate amouth secretions, noted around trach dressing. Cleansed, dried and split dressing replaced.   0445-Patient noted scooted down in bed. Assisted up in bed so that head is 30-35 degrees up. On aspiration precautions. Bed alarm on.  0542- Incontinent episode noted, pericare given. Perwick changed. Tolerated side to side turn well. Feeding held while lying flat for repositioning. Patient has congested cough. No vomiting. Suctioned think white secretions from trach. Bed alarm on. Alert but nonverbal. No distress noted. On 5L at 28%Sats 96-98%

## 2019-12-16 NOTE — Progress Notes (Signed)
Neurosurgery Service Progress Note  Subjective: No acute events overnight, no new complaints   Objective: Vitals:   12/16/19 0757 12/16/19 0800 12/16/19 1125 12/16/19 1141  BP: (!) 161/78 (!) 157/87 (!) 156/80 (!) 170/78  Pulse: 76 76 92 71  Resp: (!) 23 (!) 25 (!) 28 17  Temp: 99 F (37.2 C)  99 F (37.2 C)   TempSrc: Axillary  Oral   SpO2: 100% 99% 98% 98%  Weight:      Height:       Temp (24hrs), Avg:98.9 F (37.2 C), Min:98.8 F (37.1 C), Max:99 F (37.2 C)  CBC Latest Ref Rng & Units 12/14/2019 12/12/2019 12/10/2019  WBC 4.0 - 10.5 K/uL 8.3 8.4 10.1  Hemoglobin 12.0 - 15.0 g/dL 8.0(L) 8.4(L) 7.6(L)  Hematocrit 36 - 46 % 26.5(L) 27.2(L) 24.6(L)  Platelets 150 - 400 K/uL 371 421(H) 393   BMP Latest Ref Rng & Units 12/14/2019 12/12/2019 12/12/2019  Glucose 70 - 99 mg/dL 156(H) 186(H) 246(H)  BUN 8 - 23 mg/dL 46(H) 58(H) 56(H)  Creatinine 0.44 - 1.00 mg/dL 1.05(H) 1.26(H) 1.34(H)  Sodium 135 - 145 mmol/L 146(H) 146(H) 146(H)  Potassium 3.5 - 5.1 mmol/L 5.1 5.5(H) 5.6(H)  Chloride 98 - 111 mmol/L 119(H) 123(H) 121(H)  CO2 22 - 32 mmol/L 20(L) 14(L) 17(L)  Calcium 8.9 - 10.3 mg/dL 9.3 9.3 9.5    Intake/Output Summary (Last 24 hours) at 12/16/2019 1630 Last data filed at 12/16/2019 1348 Gross per 24 hour  Intake 1184.17 ml  Output 1050 ml  Net 134.17 ml    Current Facility-Administered Medications:  .  albuterol (PROVENTIL) (2.5 MG/3ML) 0.083% nebulizer solution 2.5 mg, 2.5 mg, Nebulization, Q4H PRN, Agarwala, Ravi, MD .  amLODipine (NORVASC) tablet 5 mg, 5 mg, Per Tube, Daily, Agarwala, Ravi, MD, 5 mg at 12/16/19 1019 .  chlorhexidine (PERIDEX) 0.12 % solution 15 mL, 15 mL, Mouth Rinse, BID, Agarwala, Ravi, MD, 15 mL at 12/16/19 1020 .  Chlorhexidine Gluconate Cloth 2 % PADS 6 each, 6 each, Topical, Q0600, Kipp Brood, MD, 6 each at 12/16/19 0546 .  dextrose 50 % solution 25-50 mL, 25-50 mL, Intravenous, Q4H PRN, Kipp Brood, MD, 25 mL at 12/15/19 0339 .  enoxaparin  (LOVENOX) injection 80 mg, 80 mg, Subcutaneous, Q12H, Agarwala, Ravi, MD, 80 mg at 12/16/19 1155 .  famotidine (PEPCID) 40 MG/5ML suspension 20 mg, 20 mg, Per Tube, Daily, Agarwala, Ravi, MD, 20 mg at 12/16/19 1151 .  feeding supplement (JEVITY 1.2 CAL) liquid 1,000 mL, 1,000 mL, Per Tube, Q24H, Agarwala, Ravi, MD, Last Rate: 50 mL/hr at 12/16/19 1329, 1,000 mL at 12/16/19 1329 .  feeding supplement (PRO-STAT SUGAR FREE 64) liquid 60 mL, 60 mL, Per Tube, BID, Agarwala, Ravi, MD, 60 mL at 12/16/19 1020 .  free water 300 mL, 300 mL, Per Tube, Q6H, Agarwala, Ravi, MD, 300 mL at 12/16/19 1151 .  insulin aspart (novoLOG) injection 0-15 Units, 0-15 Units, Subcutaneous, Q4H, Kipp Brood, MD, 3 Units at 12/16/19 0858 .  insulin aspart (novoLOG) injection 2 Units, 2 Units, Subcutaneous, Q4H, Agarwala, Ravi, MD, 2 Units at 12/16/19 1150 .  insulin detemir (LEVEMIR) injection 10 Units, 10 Units, Subcutaneous, Daily, Kipp Brood, MD, 10 Units at 12/16/19 1148 .  ipratropium-albuterol (DUONEB) 0.5-2.5 (3) MG/3ML nebulizer solution 3 mL, 3 mL, Nebulization, Q4H PRN, Agarwala, Ravi, MD .  levETIRAcetam (KEPPRA) 100 MG/ML solution 500 mg, 500 mg, Per Tube, BID, Agarwala, Ravi, MD, 500 mg at 12/16/19 1019 .  MEDLINE mouth rinse, 15 mL,  Mouth Rinse, q12n4p, Agarwala, Ravi, MD, 15 mL at 12/16/19 1152 .  melatonin tablet 3 mg, 3 mg, Oral, QHS, Agarwala, Ravi, MD, 3 mg at 12/15/19 2128 .  multivitamin with minerals tablet 1 tablet, 1 tablet, Per Tube, Daily, Agarwala, Ravi, MD, 1 tablet at 12/16/19 1019 .  ondansetron (ZOFRAN) tablet 4 mg, 4 mg, Oral, Q4H PRN, 4 mg at 12/11/19 2200 **OR** ondansetron (ZOFRAN) injection 4 mg, 4 mg, Intravenous, Q4H PRN, Agarwala, Ravi, MD, 4 mg at 12/10/19 1532 .  scopolamine (TRANSDERM-SCOP) 1 MG/3DAYS 1.5 mg, 1 patch, Transdermal, Q72H, Agarwala, Ravi, MD, 1.5 mg at 12/14/19 1445 .  sodium chloride flush (NS) 0.9 % injection 10-40 mL, 10-40 mL, Intracatheter, Q12H, Agarwala, Ravi,  MD, 10 mL at 12/16/19 1000 .  sodium chloride flush (NS) 0.9 % injection 10-40 mL, 10-40 mL, Intracatheter, PRN, Kipp Brood, MD   Physical Exam: Awake/alert, trach'd, FCx4  Assessment & Plan: 69 y.o. woman s/p clipping of L MCA ruptured aneurysm.  -no change in plan of care, neurologically stable, SNF pending, on enoxaparin for PPx  Rebecca Green  12/16/19 4:30 PM

## 2019-12-17 DIAGNOSIS — I6012 Nontraumatic subarachnoid hemorrhage from left middle cerebral artery: Secondary | ICD-10-CM | POA: Diagnosis not present

## 2019-12-17 DIAGNOSIS — I609 Nontraumatic subarachnoid hemorrhage, unspecified: Secondary | ICD-10-CM | POA: Diagnosis not present

## 2019-12-17 LAB — GLUCOSE, CAPILLARY
Glucose-Capillary: 119 mg/dL — ABNORMAL HIGH (ref 70–99)
Glucose-Capillary: 120 mg/dL — ABNORMAL HIGH (ref 70–99)
Glucose-Capillary: 122 mg/dL — ABNORMAL HIGH (ref 70–99)
Glucose-Capillary: 132 mg/dL — ABNORMAL HIGH (ref 70–99)
Glucose-Capillary: 206 mg/dL — ABNORMAL HIGH (ref 70–99)
Glucose-Capillary: 63 mg/dL — ABNORMAL LOW (ref 70–99)
Glucose-Capillary: 74 mg/dL (ref 70–99)
Glucose-Capillary: 99 mg/dL (ref 70–99)

## 2019-12-17 NOTE — Plan of Care (Signed)
  Problem: Clinical Measurements: Goal: Ability to maintain clinical measurements within normal limits will improve Outcome: Progressing Goal: Will remain free from infection Outcome: Progressing Goal: Diagnostic test results will improve Outcome: Progressing Goal: Respiratory complications will improve Outcome: Progressing Goal: Cardiovascular complication will be avoided Outcome: Progressing   Problem: Activity: Goal: Risk for activity intolerance will decrease Outcome: Progressing   Problem: Nutrition: Goal: Adequate nutrition will be maintained Outcome: Progressing   Problem: Elimination: Goal: Will not experience complications related to bowel motility Outcome: Progressing Goal: Will not experience complications related to urinary retention Outcome: Progressing   Problem: Safety: Goal: Ability to remain free from injury will improve Outcome: Progressing   Problem: Skin Integrity: Goal: Risk for impaired skin integrity will decrease Outcome: Progressing   

## 2019-12-17 NOTE — Progress Notes (Signed)
Hypoglycemic Event  CBG: 63  Treatment: 1/2 amp D50  Symptoms: Unknown  Follow-up CBG: Time: 2103 CBG Result: 120  Possible Reasons for Event: Tube feed disconnection  Late entry:   Shantavia Jha E Arda Keadle

## 2019-12-17 NOTE — Progress Notes (Addendum)
0206- Patient repositioned in bed. BM noted to pad and in bed. Pericare given and perwick and bed linen changed.  Patient has congested productive cough when turned. Keep attempting to keep patient turned but while patient is awake, she scoots down in the bed and position herself sideways. Pillows used to keep position. HOB elevated at 30 degrees. Patient sats 99-100% during suction. Tolerated suction fair. Patient is guarded towards suctioning and tries to put hands over trach. Copious secretions oozing from trach. Dressing changed. Alert with eyes open. Remains nonverbal. No evidence of learning about repositioning and safety. Bed alarm on. Will continue to observe.   6767- Suctioned as needed. Patient has copious secretions oozing from trach. Dressing around trach changed multiple times during the shift. Secretions very thick, sats 98-99%, respirations tachypneic intermittently during the night. 27-34bpm. Tolerates repositioning fair.

## 2019-12-17 NOTE — Progress Notes (Signed)
Subjective: NAEs o/n  Objective: Vital signs in last 24 hours: Temp:  [98.5 F (36.9 C)-99 F (37.2 C)] 98.7 F (37.1 C) (07/04 0719) Pulse Rate:  [71-102] 102 (07/04 0719) Resp:  [12-29] 19 (07/04 0523) BP: (148-187)/(74-89) 162/88 (07/04 0719) SpO2:  [98 %-100 %] 99 % (07/04 0719) FiO2 (%):  [12 %-28 %] 28 % (07/04 0855)  Intake/Output from previous day: 07/03 0701 - 07/04 0700 In: 1210 [I.V.:10; NG/GT:1200] Out: 1000 [Urine:1000] Intake/Output this shift: No intake/output data recorded.  Awake, alert FC x 4 Eyes open spontaneously c/d Trach'd  Lab Results: No results for input(s): WBC, HGB, HCT, PLT in the last 72 hours. BMET No results for input(s): NA, K, CL, CO2, GLUCOSE, BUN, CREATININE, CALCIUM in the last 72 hours.  Studies/Results: No results found.  Assessment/Plan: S/p clipping of ruptured MCA aneurysm - awaiting SNF   Rebecca Green 12/17/2019, 10:19 AM

## 2019-12-18 DIAGNOSIS — I639 Cerebral infarction, unspecified: Secondary | ICD-10-CM

## 2019-12-18 DIAGNOSIS — I6012 Nontraumatic subarachnoid hemorrhage from left middle cerebral artery: Secondary | ICD-10-CM | POA: Diagnosis not present

## 2019-12-18 DIAGNOSIS — I609 Nontraumatic subarachnoid hemorrhage, unspecified: Secondary | ICD-10-CM | POA: Diagnosis not present

## 2019-12-18 LAB — GLUCOSE, CAPILLARY
Glucose-Capillary: 122 mg/dL — ABNORMAL HIGH (ref 70–99)
Glucose-Capillary: 106 mg/dL — ABNORMAL HIGH (ref 70–99)
Glucose-Capillary: 46 mg/dL — ABNORMAL LOW (ref 70–99)
Glucose-Capillary: 60 mg/dL — ABNORMAL LOW (ref 70–99)
Glucose-Capillary: 216 mg/dL — ABNORMAL HIGH (ref 70–99)
Glucose-Capillary: 124 mg/dL — ABNORMAL HIGH (ref 70–99)

## 2019-12-18 MED ORDER — DEXTROSE 50 % IV SOLN
INTRAVENOUS | Status: AC
  Filled 2019-12-18: qty 50

## 2019-12-18 NOTE — Progress Notes (Signed)
Inpatient Diabetes Program Recommendations  AACE/ADA: New Consensus Statement on Inpatient Glycemic Control (2015)  Target Ranges:  Prepandial:   less than 140 mg/dL      Peak postprandial:   less than 180 mg/dL (1-2 hours)      Critically ill patients:  140 - 180 mg/dL   Results for HILA, BOLDING (MRN [id number]) as of 12/18/2019 10:40  Ref. Range 12/17/2019 00:38 12/17/2019 05:09 12/17/2019 07:33 12/17/2019 12:16 12/17/2019 15:48 12/17/2019 20:12 12/17/2019 21:03  Glucose-Capillary Latest Ref Range: 70 - 99 mg/dL 122 (H)  4 units NOVOLOG  132 (H)  4 units NOVOLOG  99  2 units NOVOLOG  206 (H)  7 units NOVOLOG +  10 units LEVEMIR  74 63 (L) 120 (H)   Results for HANAAN, GANCARZ (MRN [id number]) as of 12/18/2019 10:40  Ref. Range 12/17/2019 23:32 12/18/2019 04:30 12/18/2019 07:30 12/18/2019 07:53 12/18/2019 08:17  Glucose-Capillary Latest Ref Range: 70 - 99 mg/dL 119 (H)  2 units NOVOLOG  122 (H)  4 units NOVOLOG  46 (L) 60 (L) 106 (H)     Current Insulin Orders: Novolog Moderate Correction Scale/ SSI (0-15 units) Q4 hours        Novolog 2 units Q4 hours        Levemir 10 units Daily     MD- Note patient having occasional Hypoglycemic events.  Tube feeds running 50cc/hr,  Please consider the following:  1. Reduce Levemir slightly to 8 units Daily  2. Reduce the Novolog SSI regimen to the Very Sensitive scale (0-6 units) Q4 hours per the Glycemic Control order set     --Will follow patient during hospitalization--  Wyn Quaker RN, MSN, CDE Diabetes Coordinator Inpatient Glycemic Control Team Team Pager: [phone] (8a-5p)

## 2019-12-18 NOTE — Progress Notes (Signed)
Subjective: NAEs o/n  Objective: Vital signs in last 24 hours: Temp:  [98 F (36.7 C)-99.2 F (37.3 C)] 98.3 F (36.8 C) (07/05 0732) Pulse Rate:  [70-94] 94 (07/05 0754) Resp:  [18-27] 18 (07/05 0754) BP: (120-178)/(58-97) 149/58 (07/05 0732) SpO2:  [94 %-99 %] 94 % (07/05 0754) FiO2 (%):  [26 %-28 %] 28 % (07/05 0754) Weight:  [78.5 kg] 78.5 kg (07/05 0500)  Intake/Output from previous day: 07/04 0701 - 07/05 0700 In: 1960 [I.V.:10; NG/GT:1950] Out: 203 [Urine:200; Stool:3] Intake/Output this shift: No intake/output data recorded.  Awake, alert FC x 4 Eyes open spontaneously c/d Trach'd  Lab Results: No results for input(s): WBC, HGB, HCT, PLT in the last 72 hours. BMET No results for input(s): NA, K, CL, CO2, GLUCOSE, BUN, CREATININE, CALCIUM in the last 72 hours.  Studies/Results: No results found.  Assessment/Plan: 69 y.o. woman s/p clipping of L MCA ruptured aneurysm - awaiting SNF   Rebecca Green 12/18/2019, 10:22 AM

## 2019-12-18 NOTE — Progress Notes (Signed)
NAME:  Rebecca Green, MRN:  [id number], DOB:  1950/06/30, LOS: 3 ADMISSION DATE:  11/19/2019, CONSULTATION DATE:  11/20/19 REFERRING MD:  Zada Finders, CHIEF COMPLAINT:  SAH    Brief History   69 year old female with PMH significant for seizures, previous CVA's, HTN, and known left MCA aneurysm who reports for sudden unresponsiveness, left-sided facial droop, and right upper extremity weakness. CT Head revealed large volume left sided SAH with 56mm midline shift.   Past Medical History  CVA (No residual deficits) HTN Seizures DM II   Significant Hospital Events   6/06 Admitted to [facility] NeuroICU 6/07 Aneurysm clipped. Acute decompensation - right frontal EVD placed. Intubated for airway protection 6/11 fever 101.3 6/12 change to pressure control 6/14 on PS wean 6/17 Trach Placed   Consults:  Neurology Neurosurgery  Procedures:  6/7 EVD >>6/27 6/7 ETT > 6/17 6/7 Left pterional craniotomy for clipping of MCA aneurysm 6/17 Trach >>   Significant Diagnostic Tests:  6/7 CT Head > Large volume SAH overlying left cerebral hemisphere with  left to right midline shift. New hydrocephalus of lateral and third vents. SAH reflux into occipital horns of lateral vents.  6/6 CTA Neck > Patent common and internal carotids without significant stenosis. Mixed plaque within proximal left ICA. Patent vertebrals.  6/6 CTA Head > 3x2 mm left MCA bifurcation aneurysm (increased in size from April 2020). No intracranial LVO. High grade focal stenosis within inferior division mid M2 left MCA 6/9 renal u/s > Right renal cysts without complicating factors.  No hydronephrosis. 6/19 renal u/s> No hydronephrosis or shadowing stone. 2. Stable right renal upper pole cyst.  Micro Data:  6/6 SARS CoV2 > Negative 6/8 resp >  Normal oral flora 6/17 resp > Few Staph Epidermidis > resistant to erythromycin, oxacillin, and Bactrim 6/19 blood cultures 1>   Antimicrobials:  6/7 Ancef x1 6/7 Rocephin > 6/10 Ancef  6/18 x 1 6/18 vancomycin 6/20 6/20 Zyvox 6/22  Interim history/subjective:   Still having copious secretions, follows commands intermittently.  Objective   Blood pressure (!) 156/84, pulse 87, temperature 98.3 F (36.8 C), temperature source Oral, resp. rate (!) 21, height 5\' 6"  (1.676 m), weight 78.5 kg, SpO2 98 %.    FiO2 (%):  [26 %-28 %] 28 %   Intake/Output Summary (Last 24 hours) at 12/18/2019 1151 Last data filed at 12/18/2019 1100 Gross per 24 hour  Intake 1950 ml  Output 203 ml  Net 1747 ml   Filed Weights   12/14/19 0500 12/16/19 0600 12/18/19 0500  Weight: 76.8 kg 78.4 kg 78.5 kg    Examination: General elderly female awake. Lying sideways in bed.  Tracheostomy in place decreasing clear secretions. Site intact Chest clear HS normal  Trace edema Awake and following simple commands. Moves purposefully.  Unable to phonate when tracheostomy occluded.   Resolved Hospital Problem list   Hyponatremia Hyperchloremia Hyperkalemia Hyperphosphatemia  Assessment & Plan:   Acute hypoxic respiratory failure with compromised airway in setting of SAH.  Secretions have improved and she is able to manage them adequately by coughing. -Continue trach collar.  - Patient's mental status does not allow for capping trial.   Remains encephalopathic secondary to subarachnoid hemorrhage. She has improved since starting melatonin. -Increase therapy as tolerated.  Acute left-sided SAH with hydrocephalus,  Status post clipping, status post EVD removal. Continue to monitor neurosurgery is following  RUE DVT  Have restarted anticoagulation for right upper extremity DVT as present on repeat scan. -  Currently on Lovenox -If tolerates Lovenox with no signs of bleeding can eventually transition to Eliquis.  Best practice:  Diet: Tube feeds Pain/Anxiety/Delirium protocol (if indicated): n/a VAP protocol: if indicated  DVT prophylaxis: Added Lovenox for DVT treatment.  Transition to  Eliquis. GI prophylaxis: pepcid  Glucose control: SSI, Levemir -type 2 diabetes well controlled. Mobility: Bed rest Code Status: Full code Family Communication: Family updated 6/29 Disposition: Transfer to PCU.  PCCM to continue to follow for tracheostomy care.  Ready for [facility]  Labs   CBC: Recent Labs  Lab 12/12/19 0550 12/14/19 0540  WBC 8.4 8.3  HGB 8.4* 8.0*  HCT 27.2* 26.5*  MCV 98.6 98.5  PLT 421* 997    Basic Metabolic Panel: Recent Labs  Lab 12/12/19 0550 12/12/19 1453 12/14/19 0540  NA 146* 146* 146*  K 5.6* 5.5* 5.1  CL 121* 123* 119*  CO2 17* 14* 20*  GLUCOSE 246* 186* 156*  BUN 56* 58* 46*  CREATININE 1.34* 1.26* 1.05*  CALCIUM 9.5 9.3 9.3  MG  --   --  2.0  PHOS  --   --  3.8   GFR: Estimated Creatinine Clearance: 52.7 mL/min (A) (by C-G formula based on SCr of 1.05 mg/dL (H)). Recent Labs  Lab 12/12/19 0550 12/14/19 0540  WBC 8.4 8.3    Liver Function Tests: No results for input(s): AST, ALT, ALKPHOS, BILITOT, PROT, ALBUMIN in the last 168 hours. No results for input(s): LIPASE, AMYLASE in the last 168 hours. No results for input(s): AMMONIA in the last 168 hours.  ABG    Component Value Date/Time   PHART 7.442 12/05/2019 0300   PCO2ART 32.3 12/05/2019 0300   PO2ART 109 (H) 12/05/2019 0300   HCO3 21.8 12/05/2019 0300   TCO2 23 12/05/2019 0300   ACIDBASEDEF 2.0 12/05/2019 0300   O2SAT 98.0 12/05/2019 0300     Coagulation Profile: No results for input(s): INR, PROTIME in the last 168 hours.  Cardiac Enzymes: No results for input(s): CKTOTAL, CKMB, CKMBINDEX, TROPONINI in the last 168 hours.  HbA1C: Hgb A1c MFr Bld  Date/Time Value Ref Range Status  11/21/2019 10:55 AM 7.8 (H) 4.8 - 5.6 % Final    Comment:    (NOTE) Pre diabetes:          5.7%-6.4% Diabetes:              >6.4% Glycemic control for   <7.0% adults with diabetes     CBG: Recent Labs  Lab 12/17/19 2332 12/18/19 0430 12/18/19 0730 12/18/19 0753  12/18/19 [place] Davied Nocito, MD [facility] ICU Physician [place]  Pager: [phone] Mobile: [phone] After hours: [phone].  12/18/2019, 11:51 AM      12/18/2019, 11:51 AM

## 2019-12-18 NOTE — Progress Notes (Signed)
Physical Therapy Treatment Patient Details Name: Rebecca Green MRN: [id number] DOB: 04-04-1951 Today's Date: 12/18/2019    History of Present Illness Pt is a 69 yo female with ho of HTN, CVAs and known L MCA aneurysm that became unresponsive on 6/6.  Pt now with L SAH with midline shift.  Pt had aneurysm clipped and ventriculostomy placed. Intubated 11/20/19 and trach placed on 6/17.  s/p G tube 12/07/19.    PT Comments    Goals updated today and pt remains slow to progress with therapy.  She remains lethargic, difficult to arouse unless given noxious stimuli, did not follow any commands for me today, but tolerated EOB well with VSS on 28% TC.  She had a few productive coughs expectorating mucus that could be suctioned with the external [place] from her trach.  I have not seen any active movement in bil LEs and today bil UEs are flexed up to her face and guarded, taking a long slow stretch to get them to loosen.     Follow Up Recommendations  SNF     Equipment Recommendations  Wheelchair (measurements PT);Wheelchair cushion (measurements PT);Hospital bed;Other (comment) (hoyer lift)    Recommendations for Other Services   NA     Precautions / Restrictions Precautions Precautions: Fall Precaution Comments: trach/trach collar, PEG    Mobility  Bed Mobility Overal bed mobility: Needs Assistance Bed Mobility: Supine to Sit;Sit to Supine     Supine to sit: Total assist Sit to supine: Total assist   General bed mobility comments: Total assist to come up to sitting EOB and return later to supine.  bil arms are flexed and hands at mouth with bil mittens, Able to relax with slow prolonged stretch.       Modified Rankin (Stroke Patients Only) Modified Rankin (Stroke Patients Only) Pre-Morbid Rankin Score: No significant disability Modified Rankin: Severe disability     Balance Overall balance assessment: Needs assistance Sitting-balance support: Feet supported;No upper extremity  supported Sitting balance-Leahy Scale: Zero Sitting balance - Comments: max assist EOB for sitting balance, when assist is lessened, pt immediately tips without righting reactions at trunk or arms.  Attempted to increase arousal at EOB, PROM to bil LEs, UEs.                                     Cognition Arousal/Alertness: Lethargic Behavior During Therapy: Flat affect Overall Cognitive Status: Impaired/Different from baseline Area of Impairment: Attention;Following commands                   Current Attention Level: Focused   Following Commands:  (I did not get her to follow any commands today)       General Comments: Pt lethargic, eyes closed most of the session, not following any commands for me today, opens eye to noxious stimuli.       Exercises General Exercises - Upper Extremity Elbow Extension: PROM;Both (stretch) General Exercises - Lower Extremity Ankle Circles/Pumps: PROM;Both;10 reps Long Arc Quad: PROM;Both;10 reps Hip Flexion/Marching: PROM;Both;10 reps Other Exercises Other Exercises: cervical rotation PROM bil for stretch        Pertinent Vitals/Pain Pain Assessment: Faces Faces Pain Scale: No hurt           PT Goals (current goals can now be found in the care plan section) Acute Rehab PT Goals Patient Stated Goal: unable to state PT Goal Formulation: Patient unable to participate  in goal setting Time For Goal Achievement: 01/01/20 Potential to Achieve Goals: Fair Progress towards PT goals: Progressing toward goals    Frequency    Min 2X/week      PT Plan Current plan remains appropriate       AM-PAC PT "6 Clicks" Mobility   Outcome Measure  Help needed turning from your back to your side while in a flat bed without using bedrails?: Total Help needed moving from lying on your back to sitting on the side of a flat bed without using bedrails?: Total Help needed moving to and from a bed to a chair (including a  wheelchair)?: Total Help needed standing up from a chair using your arms (e.g., wheelchair or bedside chair)?: Total Help needed to walk in hospital room?: Total Help needed climbing 3-5 steps with a railing? : Total 6 Click Score: 6    End of Session Equipment Utilized During Treatment: Oxygen Activity Tolerance: Patient limited by lethargy Patient left: in bed;with call bell/phone within reach;with bed alarm set   PT Visit Diagnosis: Unsteadiness on feet (R26.81);Muscle weakness (generalized) (M62.81);Difficulty in walking, not elsewhere classified (R26.2);Hemiplegia and hemiparesis Hemiplegia - Right/Left: Right Hemiplegia - caused by: Nontraumatic SAH     Time: 9774-1423 PT Time Calculation (min) (ACUTE ONLY): 26 min  Charges:  $Therapeutic Exercise: 8-22 mins $Therapeutic Activity: 8-22 mins                    Verdene Lennert, PT, DPT  Acute Rehabilitation [phone] pager [phone] office

## 2019-12-18 NOTE — Progress Notes (Signed)
  Speech Language Pathology Treatment: [name] Speaking valve  Patient Details Name: Rebecca Green [place] MRN: [id number] DOB: 1950-11-29 Today's Date: 12/18/2019 Time: 0258-5277 SLP Time Calculation (min) (ACUTE ONLY): 11 min  Assessment / Plan / Recommendation Clinical Impression  Pt has a large amount of secretions that she ejects tracheally and orally. There is anterior loss of saliva that is pooling in her mouth. She does not swallow to command to try to clear them, so SLP used the yankauer. Pt followed several other one-step commands throughout session. PMV still is not tolerated for more than a few seconds at a time, and was coughed off her trach hub x1. Recommend considering cuffless trach as pt has remained off the vent.   HPI HPI: Pt is a 69 yo female presenting wiht AMS, L facial droop, and RUE weakness. CT head revealed large volume left sided SAH with 66mm midline shift s/p aneurysm clipping and EVD placement 6/7 (EVD d/c 6/27). She was intubated 6/7; trach placed 6/17. PEG 6/24. PMH includes: CVA (no residual deficits), seizures, HTN, DM II, L MCA aneurysm      SLP Plan  Continue with current plan of care       Recommendations         Patient may use Passy-Muir Speech Valve: with SLP only MD: Please consider changing trach tube to : Cuffless         Oral Care Recommendations: Oral care QID Follow up Recommendations: Skilled Nursing facility SLP Visit Diagnosis: Aphonia (R49.1) Plan: Continue with current plan of care       GO                Osie Bond., M.A. [place] Pager [phone] Office [phone]  12/18/2019, 1:16 PM

## 2019-12-19 DIAGNOSIS — I6012 Nontraumatic subarachnoid hemorrhage from left middle cerebral artery: Secondary | ICD-10-CM | POA: Diagnosis not present

## 2019-12-19 DIAGNOSIS — I609 Nontraumatic subarachnoid hemorrhage, unspecified: Secondary | ICD-10-CM | POA: Diagnosis not present

## 2019-12-19 LAB — GLUCOSE, CAPILLARY
Glucose-Capillary: 125 mg/dL — ABNORMAL HIGH (ref 70–99)
Glucose-Capillary: 144 mg/dL — ABNORMAL HIGH (ref 70–99)
Glucose-Capillary: 151 mg/dL — ABNORMAL HIGH (ref 70–99)
Glucose-Capillary: 153 mg/dL — ABNORMAL HIGH (ref 70–99)
Glucose-Capillary: 245 mg/dL — ABNORMAL HIGH (ref 70–99)
Glucose-Capillary: 38 mg/dL — CL (ref 70–99)
Glucose-Capillary: 77 mg/dL (ref 70–99)
Glucose-Capillary: 90 mg/dL (ref 70–99)

## 2019-12-19 MED ORDER — LEVETIRACETAM IN NACL 500 MG/100ML IV SOLN
500.0000 mg | Freq: Two times a day (BID) | INTRAVENOUS | Status: DC
  Administered 2019-12-19 – 2019-12-22 (×6): 500 mg via INTRAVENOUS
  Filled 2019-12-19 (×6): qty 100

## 2019-12-19 NOTE — Progress Notes (Signed)
Occupational Therapy Treatment Patient Details Name: Rebecca Green MRN: [id number] DOB: 27-Jul-1950 Today's Date: 12/19/2019    History of present illness Pt is a 69 yo female with ho of HTN, CVAs and known L MCA aneurysm that became unresponsive on 6/6.  Pt now with L SAH with midline shift.  Pt had aneurysm clipped and ventriculostomy placed. Intubated 11/20/19 and trach placed on 6/17.  s/p G tube 12/07/19.   OT comments  Pt found with feet over R bed rail. Pt positioned at EOB and will demonstrate knee flexion but will not demonstrate knee extension on command. Pt at EOB 15 minutes with (A) max to min (A). Pt attempting to scoot herself back on the bed multiple times this session with self initiation of an anterior weight shift. Pt engaged in 1 step command adls this session. Recommend SNF for d/c planning.    Follow Up Recommendations  SNF;Supervision/Assistance - 24 hour    Equipment Recommendations  Other (comment)    Recommendations for Other Services      Precautions / Restrictions Precautions Precautions: Fall Precaution Comments: trach/trach collar, PEG       Mobility Bed Mobility Overal bed mobility: Needs Assistance Bed Mobility: Supine to Sit;Sit to Supine;Rolling Rolling: Total assist   Supine to sit: +2 for physical assistance;Total assist Sit to supine: +2 for physical assistance;Total assist   General bed mobility comments: Pt sitting up from flat position on Right side of the bed. pt requires total (A) . pt reaching with hands once hips are initiated. PT at eob max (A) to min (A) sitting balance. pt prefers elbows on knees hip flexion with anterior weight shift. pt unable to complete extension of spine to static sitting. pt once in static sitting progressively returning to this prefered position with L visual preference  Transfers                 General transfer comment: deferred at this time. PT placed in chair position for upright posture    Balance  Overall balance assessment: Needs assistance Sitting-balance support: Bilateral upper extremity supported;Feet supported Sitting balance-Leahy Scale: Poor Sitting balance - Comments: Pt able to complete min (A) static sitting for < 3 minutes during session. as pt fatigued during session (A) increased again                                    ADL either performed or assessed with clinical judgement   ADL Overall ADL's : Needs assistance/impaired Eating/Feeding: NPO   Grooming: Wash/dry hands;Oral care;Maximal assistance;Sitting Grooming Details (indicate cue type and reason): (A) for support to static sit                               General ADL Comments: pt noted to be supine with feet over R side of bed rail flat on back on arrival. Pt sitting EOB >15 minutes this session producing clear secretions from trach and mouth. pt attempting to cover mouth with hand appropriately. Pt with decrease attention to the R and needs tactile cues . pt with decrease neck rotation toward the R     Vision       Perception     Praxis      Cognition Arousal/Alertness: Awake/alert Behavior During Therapy: Flat affect Overall Cognitive Status: Impaired/Different from baseline Area of Impairment: Attention;Following commands;Awareness  Current Attention Level: Sustained   Following Commands: Follows one step commands inconsistently   Awareness: Intellectual Problem Solving: Slow processing General Comments: pt showing 2 fingers, pt washing face. pt with tactile input to the right side of face with cue to wash face and completed task ( 2 step command) pt pulling foot back with only tactile input in addition to verbal cue. pt turning to auditory on the L > R side         Exercises     Shoulder Instructions       General Comments      Pertinent Vitals/ Pain       Pain Assessment: No/denies pain  Home Living                                           Prior Functioning/Environment              Frequency  Min 2X/week        Progress Toward Goals  OT Goals(current goals can now be found in the care plan section)  Progress towards OT goals: Progressing toward goals  Acute Rehab OT Goals Patient Stated Goal: unable to state OT Goal Formulation: Patient unable to participate in goal setting Time For Goal Achievement: 01/01/20 Potential to Achieve Goals: Poor ADL Goals Additional ADL Goal #1: Pt will sit EOB for 5 minutes with mmin assist to improve balance to groom on EOB. Additional ADL Goal #2: Pt will follow 25% of two step commands Additional ADL Goal #3: Pt will scan left and right to look at therapist Additional ADL Goal #4: Pt will perform one grooming task supine in bed with Mod A  Plan Discharge plan remains appropriate    Co-evaluation                 AM-PAC OT "6 Clicks" Daily Activity     Outcome Measure   Help from another person eating meals?: Total Help from another person taking care of personal grooming?: Total Help from another person toileting, which includes using toliet, bedpan, or urinal?: Total Help from another person bathing (including washing, rinsing, drying)?: Total Help from another person to put on and taking off regular upper body clothing?: Total Help from another person to put on and taking off regular lower body clothing?: Total 6 Click Score: 6    End of Session Equipment Utilized During Treatment: Oxygen  OT Visit Diagnosis: Other symptoms and signs involving the nervous system (R29.898);Other symptoms and signs involving cognitive function;Hemiplegia and hemiparesis;Low vision, both eyes (H54.2) Hemiplegia - Right/Left: Right Hemiplegia - caused by: Nontraumatic SAH   Activity Tolerance Patient tolerated treatment well   Patient Left in bed;with call bell/phone within reach;with bed alarm set;with restraints reapplied   Nurse Communication  Mobility status;Precautions        Time: 8099-8338 OT Time Calculation (min): 27 min  Charges: OT General Charges $OT Visit: 1 Visit OT Treatments $Self Care/Home Management : 23-37 mins   Brynn, OTR/L  Acute Rehabilitation Services Pager: [phone] Office: [phone] .    Jeri Modena 12/19/2019, 10:39 AM

## 2019-12-19 NOTE — Progress Notes (Signed)
  NEUROSURGERY PROGRESS NOTE   No issues overnight.  EXAM:  BP (!) 155/75 (BP Location: Right Leg)   Pulse 92   Temp 98.5 F (36.9 C) (Oral)   Resp 20   Ht 5\' 6"  (1.676 m)   Wt 78.5 kg   SpO2 98%   BMI 27.93 kg/m   Awakens to voice Trach Nods head "yes" and "no" appropriately  Follows commands Incision healing well   IMPRESSION/PLAN 69 y.o. female s/p clipping L MCA aneurysm. Neurologically stable - dispo planning - SNF

## 2019-12-19 NOTE — Progress Notes (Signed)
Physical Therapy Treatment Patient Details Name: Rebecca Green MRN: [id number] DOB: 04-07-1951 Today's Date: 12/19/2019    History of Present Illness Pt is a 69 yo female with ho of HTN, CVAs and known L MCA aneurysm that became unresponsive on 6/6.  Pt now with L SAH with midline shift.  Pt had aneurysm clipped and ventriculostomy placed. Intubated 11/20/19 and trach placed on 6/17.  s/p G tube 12/07/19.    PT Comments    Pt remains the same from cognitive and functional stand point. Pt following commands intitially however stopped s/p 10 min. Pt non-verbal, noted R sided inattention, inconsistent command follow, decresaed insight to safety and requiring total assist for all mobility. Pt requiring maxA to maintain EOB balance while OT completed ADL tasks with patient. Acute PT to cont to follow.    Follow Up Recommendations  SNF     Equipment Recommendations  Wheelchair (measurements PT);Wheelchair cushion (measurements PT);Hospital bed;Other (comment)    Recommendations for Other Services Other (comment)     Precautions / Restrictions Precautions Precautions: Fall Precaution Comments: trach/trach collar, PEG Restrictions Weight Bearing Restrictions: No    Mobility  Bed Mobility Overal bed mobility: Needs Assistance Bed Mobility: Supine to Sit;Sit to Supine Rolling: Total assist   Supine to sit: Total assist;+2 for physical assistance Sit to supine: Total assist;+2 for physical assistance   General bed mobility comments: pt laying across bed with legs over railing with bilat mittens on reaching for things  Transfers                 General transfer comment: deferred due to pt with no further command follow or sequencing of tasks  Ambulation/Gait                 Stairs             Wheelchair Mobility    Modified Rankin (Stroke Patients Only) Modified Rankin (Stroke Patients Only) Pre-Morbid Rankin Score: No significant disability Modified  Rankin: Severe disability     Balance Overall balance assessment: Needs assistance Sitting-balance support: Bilateral upper extremity supported;Feet supported Sitting balance-Leahy Scale: Poor Sitting balance - Comments: pt with strong anterior fall forward without self correction, if not forward then to the R with no righting response or protective resonse, pt was intitiating trying to scoot backwards onto bed                                    Cognition Arousal/Alertness: Awake/alert Behavior During Therapy: Flat affect Overall Cognitive Status: Impaired/Different from baseline Area of Impairment: Attention;Following commands;Awareness                   Current Attention Level: Sustained   Following Commands: Follows one step commands inconsistently Safety/Judgement: Decreased awareness of safety;Decreased awareness of deficits Awareness: Intellectual Problem Solving: Slow processing;Decreased initiation;Difficulty sequencing;Requires verbal cues;Requires tactile cues General Comments: pt initially following 1 step commands for first 10 min of session and then stopped      Exercises Other Exercises Other Exercises: attempted to engage pt in bilat LE ther ex at EOB however despite manual muscle stimulation, multiple verbal and tactile cues pt would not engage    General Comments General comments (skin integrity, edema, etc.): pt with bilat mittens on, VSS      Pertinent Vitals/Pain Pain Assessment: No/denies pain    Home Living  Prior Function            PT Goals (current goals can now be found in the care plan section) Acute Rehab PT Goals Patient Stated Goal: unable to state Progress towards PT goals: Not progressing toward goals - comment    Frequency    Min 2X/week      PT Plan Current plan remains appropriate    Co-evaluation PT/OT/SLP Co-Evaluation/Treatment: Yes Reason for Co-Treatment: Complexity  of the patient's impairments (multi-system involvement) PT goals addressed during session: Mobility/safety with mobility        AM-PAC PT "6 Clicks" Mobility   Outcome Measure  Help needed turning from your back to your side while in a flat bed without using bedrails?: Total Help needed moving from lying on your back to sitting on the side of a flat bed without using bedrails?: Total Help needed moving to and from a bed to a chair (including a wheelchair)?: Total Help needed standing up from a chair using your arms (e.g., wheelchair or bedside chair)?: Total Help needed to walk in hospital room?: Total Help needed climbing 3-5 steps with a railing? 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

## 2019-12-19 NOTE — Progress Notes (Signed)
Hypoglycemic Event  CBG: 38  Treatment: 25 mL of D50  Symptoms: Unknown  Follow-up CBG: Time: 2054 CBG Result: 77  Possible Reasons for Event: Tube feeding  Comments/MD notified: Jeralene Huff

## 2019-12-20 DIAGNOSIS — I6012 Nontraumatic subarachnoid hemorrhage from left middle cerebral artery: Secondary | ICD-10-CM | POA: Diagnosis not present

## 2019-12-20 DIAGNOSIS — I609 Nontraumatic subarachnoid hemorrhage, unspecified: Secondary | ICD-10-CM | POA: Diagnosis not present

## 2019-12-20 LAB — GLUCOSE, CAPILLARY
Glucose-Capillary: 104 mg/dL — ABNORMAL HIGH (ref 70–99)
Glucose-Capillary: 120 mg/dL — ABNORMAL HIGH (ref 70–99)
Glucose-Capillary: 125 mg/dL — ABNORMAL HIGH (ref 70–99)
Glucose-Capillary: 126 mg/dL — ABNORMAL HIGH (ref 70–99)
Glucose-Capillary: 130 mg/dL — ABNORMAL HIGH (ref 70–99)
Glucose-Capillary: 141 mg/dL — ABNORMAL HIGH (ref 70–99)
Glucose-Capillary: 97 mg/dL (ref 70–99)

## 2019-12-20 MED ORDER — JEVITY 1.2 CAL PO LIQD
1000.0000 mL | ORAL | Status: DC
  Administered 2019-12-23 – 2019-12-29 (×4): 1000 mL
  Filled 2019-12-20 (×14): qty 1000

## 2019-12-20 MED ORDER — PROSOURCE TF PO LIQD
45.0000 mL | Freq: Three times a day (TID) | ORAL | Status: DC
  Administered 2019-12-20 – 2019-12-29 (×28): 45 mL
  Filled 2019-12-20 (×27): qty 45

## 2019-12-20 NOTE — Progress Notes (Signed)
  Speech Language Pathology Treatment: [name] Speaking valve  Patient Details Name: Rebecca Green MRN: [id number] DOB: 21-Jun-1950 Today's Date: 12/20/2019 Time: 4967-5916 SLP Time Calculation (min) (ACUTE ONLY): 9 min  Assessment / Plan / Recommendation Clinical Impression  Pt has secretions pooling in her oral cavity with some audible wetness but without expectoration of secretions today as she frequently has. RN says she has not been needing tracheal suction today. She can wear PMV for perhaps a few seconds longer, but still has very quick build up of back pressure that prohibits use. Pt also does not make attempts to phonate despite cues from SLP, but she did shake her head "no" x1 in response to a yes/no question. Recommend a cuffless and/or smaller trach.   HPI HPI: Pt is a 69 yo female presenting wiht AMS, L facial droop, and RUE weakness. CT head revealed large volume left sided SAH with 64mm midline shift s/p aneurysm clipping and EVD placement 6/7 (EVD d/c 6/27). She was intubated 6/7; trach placed 6/17. PEG 6/24. PMH includes: CVA (no residual deficits), seizures, HTN, DM II, L MCA aneurysm      SLP Plan  Continue with current plan of care       Recommendations         Patient may use Passy-Muir Speech Valve: with SLP only MD: Please consider changing trach tube to : Cuffless         Oral Care Recommendations: Oral care QID Follow up Recommendations: Skilled Nursing facility SLP Visit Diagnosis: Aphonia (R49.1) Plan: Continue with current plan of care       GO                Osie Bond., M.A. [place] Acute Rehabilitation Services Pager [phone] Office [phone]  12/20/2019, 2:54 PM

## 2019-12-20 NOTE — Progress Notes (Signed)
Nutrition Follow-up  DOCUMENTATION CODES:   Not applicable  INTERVENTION:  Increased Jevity 1.2 formula via PEG to new goal rate of 60 ml/hr.   Provide 45 ml Prosource TF TID per tube.   Free water flushes of 300 ml every 6 hours per tube. (MD to adjust as appropriate).  Tube feeding to provide 1848 kcal, 113 grams of protein, 2366 ml free water.   NUTRITION DIAGNOSIS:   Inadequate oral intake related to inability to eat as evidenced by NPO status; ongoing  GOAL:   Patient will meet greater than or equal to 90% of their needs; met with TF  MONITOR:   TF tolerance, Skin, Weight trends, Labs, I & O's  REASON FOR ASSESSMENT:   Consult, Ventilator Enteral/tube feeding initiation and management  ASSESSMENT:   Pt with PMH of seizures, previous CVA's, HTN, DM, and known L MCA aneurysm who was admitted with large L SAH from ruptured L MCA aneurysm.  Pt remains on trach collar.   6/7 s/p crani for clipping and EVD placement 6/14 cortrak placed 6/17 trach 6/23 EVD clamped  6/24 PEG placed  Pt continues on NPO status. Pt has been tolerating her tube feeds well via PEG. RD to modify tube feeding orders to swtich protein modular Prostat to new formulary stock of prosource tube feeding protein. Tube feeding to continue to provide 100% of nutrition needs.   Labs and medications reviewed.   Diet Order:  NPO Diet Order    None      EDUCATION NEEDS:   No education needs have been identified at this time  Skin:  Skin Assessment: Skin Integrity Issues: Skin Integrity Issues:: Incisions Incisions: head  Last BM:  7/7  Height:   Ht Readings from Last 1 Encounters:  11/19/19 _0  (1.676 m)    Weight:   Wt Readings from Last 1 Encounters:  12/18/19 78.5 kg    BMI:  Body mass index is 27.93 kg/m.  Estimated Nutritional Needs:   Kcal:  1800-2000  Protein:  110-125 grams  Fluid:  2 L/day  Corrin Parker, MS, RD, LDN RD pager number/after hours weekend  pager number on Amion.

## 2019-12-20 NOTE — Progress Notes (Addendum)
Inpatient Diabetes Program Recommendations  AACE/ADA: New Consensus Statement on Inpatient Glycemic Control (2015)  Target Ranges:  Prepandial:   less than 140 mg/dL      Peak postprandial:   less than 180 mg/dL (1-2 hours)      Critically ill patients:  140 - 180 mg/dL    Results for ELLERY, TASH (MRN [id number]) as of 12/20/2019 09:55  Ref. Range 12/19/2019 07:16 12/19/2019 11:24 12/19/2019 16:08 12/19/2019 20:27 12/19/2019 20:54 12/20/2019 00:09 12/20/2019 04:12 12/20/2019 07:43  Glucose-Capillary Latest Ref Range: 70 - 99 mg/dL 90 144 (H) 153 (H) 38 (LL) 77 104 (H) 120 (H) 141 (H)    Current Insulin Orders: Novolog Moderate Correction Scale/ SSI (0-15 units) Q4 hours        Novolog 2 units Q4 hours        Levemir 10 units Daily    MD- Note patient having occasional Hypoglycemic events.  Tube feeds running 50cc/hr,  Please consider the following:   -  Reduce the Novolog SSI regimen to the "sensitive" 0-9 units or "Very Sensitive" scale (0-6 units) Q4 hours per the Glycemic Control order set   --Will follow patient during hospitalization--  Tama Headings RN, MSN, BC-ADM Inpatient Diabetes Coordinator Team Pager [phone] (8a-5p)

## 2019-12-20 NOTE — Progress Notes (Signed)
Occupational Therapy Treatment Patient Details Name: Rebecca Green MRN: [id number] DOB: 1950/07/28 Today's Date: 12/20/2019    History of present illness Pt is a 69 yo female with ho of HTN, CVAs and known L MCA aneurysm that became unresponsive on 6/6.  Pt now with L SAH with midline shift.  Pt had aneurysm clipped and ventriculostomy placed. Intubated 11/20/19 and trach placed on 6/17.  s/p G tube 12/07/19.   OT comments  Upon arrival RN and NT present assisting pt with pericare. Pt noted to reach to bed rail attempting to sit EOB; MAX A +2 to elevate trunk and scoot hips to EOB. Pt following commands < 50% of session. Pt able track to midline but does not respond to stimulation in pts L visual field. Pt required MIN - MOD A to sit EOB. Attempted dynamic reaching tasks with pt unable to follow commands. Defer further mobility for safety d/t inability to follow commands. VSS during session. Total A +2 to return pt to supine. DC plan remains appropriate, will follow acutely per POC.   Follow Up Recommendations  SNF;Supervision/Assistance - 24 hour    Equipment Recommendations  Other (comment)    Recommendations for Other Services      Precautions / Restrictions Precautions Precautions: Fall Precaution Comments: trach/trach collar, PEG Restrictions Weight Bearing Restrictions: No       Mobility Bed Mobility   Bed Mobility: Supine to Sit;Sit to Supine     Supine to sit: Max assist;+2 for physical assistance Sit to supine: Total assist;+2 for physical assistance   General bed mobility comments: pt noted to be attempted to get EOB with NT upon OTA arrival. assisted pt with elevating trunk and scooting hips to EOB with MAX A+2. pt appeared to be attempting to return self to supine; total A +2 to return pt to supine  Transfers                 General transfer comment: defer this session d/ tlimited ability to follow commands    Balance Overall balance assessment: Needs  assistance Sitting-balance support: Bilateral upper extremity supported;Feet supported Sitting balance-Leahy Scale: Poor Sitting balance - Comments: pt with anterior lean in sitting needing at least MIN A moslty MOD A to maintain sitting balance. attempted some dynamic reaching tasks from EOB however pt not following commands well enough to complete task Postural control:  (anterior lean)                                 ADL either performed or assessed with clinical judgement   ADL Overall ADL's : Needs assistance/impaired     Grooming: Wash/dry face;Sitting Grooming Details (indicate cue type and reason): (A) for support to static sit                   Toilet Transfer Details (indicate cue type and reason): defer this session for safety Toileting- Clothing Manipulation and Hygiene: Total assistance;+2 for physical assistance;Bed level Toileting - Clothing Manipulation Details (indicate cue type and reason): RN and NT assisting pt with posterior pericare in supine upon arrival     Functional mobility during ADLs: Maximal assistance;+2 for physical assistance (bed mobility only) General ADL Comments: pt impulsive and anxious during session noted to attempt to get EOB upon arrival     Vision   Vision Assessment?: Vision impaired- to be further tested in functional context Additional Comments: able to track to  midline but unable to attend to therapists hand when positioned in L environment   Perception     Praxis      Cognition Arousal/Alertness: Awake/alert Behavior During Therapy: Anxious;Impulsive (trying to get EOB with NT upon arrival) Overall Cognitive Status: Impaired/Different from baseline Area of Impairment: Attention;Following commands;Awareness;Problem solving                   Current Attention Level: Sustained   Following Commands: Follows one step commands inconsistently   Awareness: Intellectual Problem Solving: Slow  processing;Decreased initiation;Difficulty sequencing;Requires verbal cues;Requires tactile cues General Comments: pt following commands <50% of session. pt noted to attempt to get EOB with NT , OTA and NT assisted pt EOB with MAX A +2, however once EOB but unable to follow commands related to balance tasks or ADLs        Exercises     Shoulder Instructions       General Comments pt on trach collar 28% FiO2 5LM with VSS. pt with bil mittens on    Pertinent Vitals/ Pain       Pain Assessment: Faces Faces Pain Scale: No hurt  Home Living                                          Prior Functioning/Environment              Frequency  Min 2X/week        Progress Toward Goals  OT Goals(current goals can now be found in the care plan section)  Progress towards OT goals: Progressing toward goals  Acute Rehab OT Goals Patient Stated Goal: unable to state OT Goal Formulation: Patient unable to participate in goal setting Time For Goal Achievement: 01/01/20 Potential to Achieve Goals: Poor  Plan Discharge plan remains appropriate;Frequency remains appropriate    Co-evaluation                 AM-PAC OT "6 Clicks" Daily Activity     Outcome Measure   Help from another person eating meals?: Total (peg) Help from another person taking care of personal grooming?: Total Help from another person toileting, which includes using toliet, bedpan, or urinal?: Total Help from another person bathing (including washing, rinsing, drying)?: Total Help from another person to put on and taking off regular upper body clothing?: Total Help from another person to put on and taking off regular lower body clothing?: Total 6 Click Score: 6    End of Session Equipment Utilized During Treatment: Oxygen;Other (comment) (5LPM 28% trach collar)  OT Visit Diagnosis: Other symptoms and signs involving the nervous system (R29.898);Other symptoms and signs involving cognitive  function;Hemiplegia and hemiparesis;Low vision, both eyes (H54.2) Hemiplegia - Right/Left: Right Hemiplegia - caused by: Nontraumatic SAH   Activity Tolerance Patient tolerated treatment well   Patient Left in bed;with call bell/phone within reach;with bed alarm set   Nurse Communication Mobility status        Time: 0539-7673 OT Time Calculation (min): 13 min  Charges: OT General Charges $OT Visit: 1 Visit OT Treatments $Therapeutic Activity: 8-22 mins  Lanier Clam., COTA/L Acute Rehabilitation Services [phone] [place] 12/20/2019, 2:04 PM

## 2019-12-20 NOTE — Progress Notes (Signed)
  NEUROSURGERY PROGRESS NOTE   Hypoglycemic episode overnight. Improved now No other issues  EXAM:  BP (!) 144/72 (BP Location: Right Leg)   Pulse 90   Temp 98.4 F (36.9 C) (Oral)   Resp 20   Ht 5\' 6"  (1.676 m)   Wt 78.5 kg   SpO2 97%   BMI 27.93 kg/m   Awake, trach Follows simple commands  IMPRESSION/PLAN 69 y.o. female  S/p clipping L MCA aneurysm. Neurologically stable - dispo planning

## 2019-12-20 NOTE — Social Work (Signed)
CSW called patient's daughter, left voice message to return call.  Thurmond Butts, MSW, [place] Clinical Social Worker

## 2019-12-20 NOTE — Evaluation (Signed)
Clinical/Bedside Swallow Evaluation Patient Details  Name: Rebecca Green MRN: [id number] Date of Birth: 1951/02/26  Today's Date: 12/20/2019 Time: SLP Start Time (ACUTE ONLY): 2202 SLP Stop Time (ACUTE ONLY): 1451 SLP Time Calculation (min) (ACUTE ONLY): 9 min  Past Medical History:  Past Medical History:  Diagnosis Date  . CVA (cerebral vascular accident) ([place])   . HTN (hypertension)   . Seizure [facility])    Past Surgical History:  Past Surgical History:  Procedure Laterality Date  . CRANIOTOMY Left 11/20/2019   Procedure: LEFT CRANIOTOMY INTRACRANIAL ANEURYSM CLIPPING;  Surgeon: [name], MD;  Location: [place];  Service: Neurosurgery;  Laterality: Left;  . IR 3D INDEPENDENT WKST  11/20/2019  . IR ANGIO INTRA EXTRACRAN SEL INTERNAL CAROTID BILAT MOD SED  11/20/2019  . IR ANGIO VERTEBRAL SEL VERTEBRAL UNI L MOD SED  11/20/2019  . IR GASTROSTOMY TUBE MOD SED  12/07/2019  . IR US GUIDE VASC ACCESS RIGHT  11/20/2019  . RADIOLOGY WITH ANESTHESIA N/A 11/20/2019   Procedure: IR WITH ANESTHESIA;  Surgeon: [name], MD;  Location: [place];  Service: Radiology;  Laterality: N/A;   HPI:  Pt is a 69 yo female presenting wiht AMS, L facial droop, and RUE weakness. CT head revealed large volume left sided SAH with 15mm midline shift s/p aneurysm clipping and EVD placement 6/7 (EVD d/c 6/27). She was intubated 6/7; trach placed 6/17. PEG 6/24. PMH includes: CVA (no residual deficits), seizures, HTN, DM II, L MCA aneurysm   Assessment / Plan / Recommendation Clinical Impression  Pt has oral holding both with secretions and with ice chip trials, which were given without PMV placement to assess for swallowing potential despite inability to tolerate PMV. Anterior loss is noted with suspect decreased sensation as she does not make attempts to self-regulate. Despite cues, a swallow is not elicited throughout the session and saliva and melted ice are both suctioned back out of her mouth. She is following  some commands, but does not cough or swallow when prompted to do so. Would remain NPO for now pending improved secretion management.   SLP Visit Diagnosis: Dysphagia, unspecified (R13.10)    Aspiration Risk  Severe aspiration risk    Diet Recommendation NPO;Alternative means - long-term   Medication Administration: Via alternative means    Other  Recommendations Oral Care Recommendations: Oral care QID Other Recommendations: Have oral suction available   Follow up Recommendations Skilled Nursing facility      Frequency and Duration min 2x/week  2 weeks       Prognosis Prognosis for Safe Diet Advancement: Good Barriers to Reach Goals: Cognitive deficits;Language deficits;Severity of deficits      Swallow Study   General HPI: Pt is a 69 yo female presenting wiht AMS, L facial droop, and RUE weakness. CT head revealed large volume left sided SAH with 4mm midline shift s/p aneurysm clipping and EVD placement 6/7 (EVD d/c 6/27). She was intubated 6/7; trach placed 6/17. PEG 6/24. PMH includes: CVA (no residual deficits), seizures, HTN, DM II, L MCA aneurysm Type of Study: Bedside Swallow Evaluation Previous Swallow Assessment: none in chart Diet Prior to this Study: NPO;PEG tube Temperature Spikes Noted: No Respiratory Status: Trach;Trach Collar Trach Size and Type: Cuff;#6;Deflated;With PMSV not in place History of Recent Intubation: Yes Length of Intubations (days): 10 days Date extubated:  (trach 6/17) Behavior/Cognition: Alert;Requires cueing Oral Cavity Assessment: Excessive secretions Self-Feeding Abilities: Total assist Patient Positioning: Upright in bed Baseline Vocal Quality: Not observed Volitional Cough: Cognitively  unable to elicit Volitional Swallow: Unable to elicit    Oral/Motor/Sensory Function Overall Oral Motor/Sensory Function:  (difficulty following commands consistently to assess) Lingual Symmetry: Within Functional Limits Mandible: Within Functional  Limits   Ice Chips Ice chips: Impaired Presentation: Spoon Oral Phase Functional Implications: Oral holding Pharyngeal Phase Impairments: Unable to trigger swallow   Thin Liquid Thin Liquid: Not tested    Nectar Thick Nectar Thick Liquid: Not tested   Honey Thick Honey Thick Liquid: Not tested   Puree Puree: Not tested   Solid     Solid: Not tested      Osie Bond., M.A. [place] Pager [phone] Office [phone]  12/20/2019,3:07 PM

## 2019-12-21 DIAGNOSIS — I6012 Nontraumatic subarachnoid hemorrhage from left middle cerebral artery: Secondary | ICD-10-CM | POA: Diagnosis not present

## 2019-12-21 DIAGNOSIS — I6200 Nontraumatic subdural hemorrhage, unspecified: Secondary | ICD-10-CM | POA: Diagnosis not present

## 2019-12-21 DIAGNOSIS — J9621 Acute and chronic respiratory failure with hypoxia: Secondary | ICD-10-CM | POA: Diagnosis not present

## 2019-12-21 DIAGNOSIS — I609 Nontraumatic subarachnoid hemorrhage, unspecified: Secondary | ICD-10-CM | POA: Diagnosis not present

## 2019-12-21 DIAGNOSIS — Z20822 Contact with and (suspected) exposure to covid-19: Secondary | ICD-10-CM | POA: Diagnosis not present

## 2019-12-21 DIAGNOSIS — J9601 Acute respiratory failure with hypoxia: Secondary | ICD-10-CM | POA: Diagnosis not present

## 2019-12-21 LAB — GLUCOSE, CAPILLARY
Glucose-Capillary: 108 mg/dL — ABNORMAL HIGH (ref 70–99)
Glucose-Capillary: 120 mg/dL — ABNORMAL HIGH (ref 70–99)
Glucose-Capillary: 147 mg/dL — ABNORMAL HIGH (ref 70–99)
Glucose-Capillary: 194 mg/dL — ABNORMAL HIGH (ref 70–99)
Glucose-Capillary: 197 mg/dL — ABNORMAL HIGH (ref 70–99)
Glucose-Capillary: 215 mg/dL — ABNORMAL HIGH (ref 70–99)
Glucose-Capillary: 51 mg/dL — ABNORMAL LOW (ref 70–99)
Glucose-Capillary: 55 mg/dL — ABNORMAL LOW (ref 70–99)
Glucose-Capillary: 68 mg/dL — ABNORMAL LOW (ref 70–99)
Glucose-Capillary: 80 mg/dL (ref 70–99)

## 2019-12-21 LAB — BASIC METABOLIC PANEL
Anion gap: 8 (ref 5–15)
BUN: 29 mg/dL — ABNORMAL HIGH (ref 8–23)
CO2: 25 mmol/L (ref 22–32)
Calcium: 9 mg/dL (ref 8.9–10.3)
Chloride: 104 mmol/L (ref 98–111)
Creatinine, Ser: 1.08 mg/dL — ABNORMAL HIGH (ref 0.44–1.00)
GFR calc Af Amer: >60 mL/min (ref >60)
GFR calc non Af Amer: 52 mL/min — ABNORMAL LOW (ref >60)
Glucose, Bld: 127 mg/dL — ABNORMAL HIGH (ref 70–99)
Potassium: 5.1 mmol/L (ref 3.5–5.1)
Sodium: 137 mmol/L (ref 135–145)

## 2019-12-21 LAB — CBC
HCT: 28.7 % — ABNORMAL LOW (ref 36.0–46.0)
Hemoglobin: 9 g/dL — ABNORMAL LOW (ref 12.0–15.0)
MCH: 30.2 pg (ref 26.0–34.0)
MCHC: 31.4 g/dL (ref 30.0–36.0)
MCV: 96.3 fL (ref 80.0–100.0)
Platelets: 461 10*3/uL — ABNORMAL HIGH (ref 150–400)
RBC: 2.98 MIL/uL — ABNORMAL LOW (ref 3.87–5.11)
RDW: 13.8 % (ref 11.5–15.5)
WBC: 7 10*3/uL (ref 4.0–10.5)
nRBC: 0 % (ref 0.0–0.2)

## 2019-12-21 MED ORDER — INSULIN ASPART 100 UNIT/ML ~~LOC~~ SOLN
0.0000 [IU] | Freq: Three times a day (TID) | SUBCUTANEOUS | Status: DC
  Administered 2019-12-21 (×2): 2 [IU] via SUBCUTANEOUS
  Administered 2019-12-22: 1 [IU] via SUBCUTANEOUS
  Administered 2019-12-22: 5 [IU] via SUBCUTANEOUS
  Administered 2019-12-22: 1 [IU] via SUBCUTANEOUS
  Administered 2019-12-23 – 2019-12-24 (×5): 3 [IU] via SUBCUTANEOUS
  Administered 2019-12-24 – 2019-12-25 (×2): 1 [IU] via SUBCUTANEOUS
  Administered 2019-12-25: 5 [IU] via SUBCUTANEOUS
  Administered 2019-12-28: 2 [IU] via SUBCUTANEOUS
  Administered 2019-12-28: 3 [IU] via SUBCUTANEOUS
  Administered 2019-12-28: 2 [IU] via SUBCUTANEOUS
  Administered 2019-12-29: 5 [IU] via SUBCUTANEOUS

## 2019-12-21 MED ORDER — INSULIN ASPART 100 UNIT/ML ~~LOC~~ SOLN
0.0000 [IU] | Freq: Every day | SUBCUTANEOUS | Status: DC
  Administered 2019-12-28: 2 [IU] via SUBCUTANEOUS

## 2019-12-21 NOTE — Progress Notes (Addendum)
  NEUROSURGERY PROGRESS NOTE   Continued issues with CBGs, otherwise no issues  EXAM:  BP (!) 145/72   Pulse 86   Temp 99.1 F (37.3 C) (Oral)   Resp (!) 22   Ht 5\' 6"  (1.676 m)   Wt 78.6 kg   SpO2 98%   BMI 27.97 kg/m   Awake, trach Follows simple commands  IMPRESSION/PLAN 69 y.o. female s/p clipping L MCA aneurysm. Neurologically stable - hypoglycemia: adjusted SSI Per DM Coordinator - dispo planning

## 2019-12-21 NOTE — TOC Benefit Eligibility Note (Signed)
Transition of Care [facility]) Benefit Eligibility Note    Patient Details  Name: Rebecca Green MRN: [id number] Date of Birth: 1950/08/04   Medication/Dose: ELIQUIS 5 MG  BID  Covered?: Yes  Tier: 3 Drug  Prescription Coverage [facility]erred [facility]: [facility]  Spoke with Person/Company/Phone Number:: MARIE  @  [employer] # [phone]  Co-Pay: $39.00  Prior Approval: No  Deductible: Met       [facility] [place] Phone Number: 12/21/2019, 3:33 PM

## 2019-12-21 NOTE — Progress Notes (Signed)
Hypoglycemic Event  CBG: 51  Treatment: 2 Orange juices given via PEG   Symptoms: Alert, but slightly lethargic  Follow-up CBG: Time: 0824 CBG Result:55  Possible Reasons for Event: Peg tube noted to be kinked   Comments/MD notified:Hypoglycemic protocol initiated     [place]

## 2019-12-21 NOTE — Progress Notes (Signed)
NAME:  [place], MRN:  [id number], DOB:  July 21, 1950, LOS: 22 ADMISSION DATE:  11/19/2019, CONSULTATION DATE:  11/20/19 REFERRING MD:  Zada Finders, CHIEF COMPLAINT:  SAH    Brief History   69 year old female with PMH significant for seizures, previous CVA's, HTN, and known left MCA aneurysm who reports for sudden unresponsiveness, left-sided facial droop, and right upper extremity weakness. CT Head revealed large volume left sided SAH with 78mm midline shift.   Past Medical History  CVA (No residual deficits) HTN Seizures DM II   Significant Hospital Events   6/06 Admitted to [facility]ICU 6/07 Aneurysm clipped. Acute decompensation - right frontal EVD placed. Intubated for airway protection 6/11 fever 101.3 6/12 change to pressure control 6/14 on PS wean 6/17 Trach Placed   Consults:  Neurology Neurosurgery  Procedures:  6/7 EVD >>6/27 6/7 ETT > 6/17 6/7 Left pterional craniotomy for clipping of MCA aneurysm 6/17 Trach >>   Significant Diagnostic Tests:  6/7 CT Head > Large volume SAH overlying left cerebral hemisphere with  left to right midline shift. New hydrocephalus of lateral and third vents. SAH reflux into occipital horns of lateral vents.  6/6 CTA Neck > Patent common and internal carotids without significant stenosis. Mixed plaque within proximal left ICA. Patent vertebrals.  6/6 CTA Head > 3x2 mm left MCA bifurcation aneurysm (increased in size from April 2020). No intracranial LVO. High grade focal stenosis within inferior division mid M2 left MCA 6/9 renal u/s > Right renal cysts without complicating factors.  No hydronephrosis. 6/19 renal u/s> No hydronephrosis or shadowing stone. 2. Stable right renal upper pole cyst.  Micro Data:  6/6 SARS CoV2 > Negative 6/8 resp >  Normal oral flora 6/17 resp > Few Staph Epidermidis > resistant to erythromycin, oxacillin, and Bactrim 6/19 blood cultures 1>   Antimicrobials:  6/7 Ancef x1 6/7 Rocephin > 6/10 Ancef  6/18 x 1 6/18 vancomycin 6/20 6/20 Zyvox 6/22  Interim history/subjective:  Opens eyes to voice minimally.  Does not follow commands.  Objective   Blood pressure (!) 141/62, pulse 84, temperature 99.1 F (37.3 C), temperature source Oral, resp. rate (!) 21, height 5\' 6"  (1.676 m), weight 78.6 kg, SpO2 98 %.    FiO2 (%):  [28 %] 28 %   Intake/Output Summary (Last 24 hours) at 12/21/2019 0834 Last data filed at 12/21/2019 0601 Gross per 24 hour  Intake 610 ml  Output 1700 ml  Net -1090 ml   Filed Weights   12/16/19 0600 12/18/19 0500 12/21/19 0500  Weight: 78.4 kg 78.5 kg 78.6 kg    Examination: General: Elderly female, resting in bed, in NAD. Neuro: Sleeping but opens eyes to voice occasionally.  Does not follow commands but MAE's. HEENT: [place]/AT. Sclerae anicteric. Trach C/D/I. Cardiovascular: RRR, no M/R/G.  Lungs: Respirations even and unlabored.  CTA bilaterally, No W/R/R. Abdomen: BS x 4, soft, NT/ND.  Musculoskeletal: No gross deformities, no edema.  Skin: Intact, warm, no rashes.    Assessment & Plan:   Acute hypoxic respiratory failure with compromised airway in setting of SAH.  Secretions have improved and she is able to manage them adequately by coughing. - Continue trach collar as tolerated. - Continue routine trach care. - Continue scopolamine. - Patient's mental status does not allow for capping trial.  - Intermittent CXR.  Acute left-sided SAH with hydrocephalus - Status post clipping and EVD (now removed). - Per neurosurgery.  LUE DVT. - Currently on Lovenox, will  continue. - If continues to tolerate Lovenox with no signs of bleeding can eventually transition to Eliquis.   PCCM will see 1 - 2 times per week.  Please call if needs arise sooner.   Montey Hora, [place] 12/21/2019, 8:40 AM

## 2019-12-21 NOTE — Progress Notes (Signed)
Hypoglycemic Event  CBG: 55   Treatment: Need IV inserted and 33ml D50 given   Symptoms:Lethargic  Follow-up CBG: SYSD:[id number] CBG Result: 108  Possible Reasons for Event: stated previously, Peg was kinked  Comments/MD notified: Hypoglycemic protocol follow up    Theotis Barrio

## 2019-12-22 DIAGNOSIS — I609 Nontraumatic subarachnoid hemorrhage, unspecified: Secondary | ICD-10-CM | POA: Diagnosis not present

## 2019-12-22 DIAGNOSIS — I6012 Nontraumatic subarachnoid hemorrhage from left middle cerebral artery: Secondary | ICD-10-CM | POA: Diagnosis not present

## 2019-12-22 LAB — GLUCOSE, CAPILLARY
Glucose-Capillary: 118 mg/dL — ABNORMAL HIGH (ref 70–99)
Glucose-Capillary: 128 mg/dL — ABNORMAL HIGH (ref 70–99)
Glucose-Capillary: 135 mg/dL — ABNORMAL HIGH (ref 70–99)
Glucose-Capillary: 160 mg/dL — ABNORMAL HIGH (ref 70–99)
Glucose-Capillary: 172 mg/dL — ABNORMAL HIGH (ref 70–99)
Glucose-Capillary: 212 mg/dL — ABNORMAL HIGH (ref 70–99)
Glucose-Capillary: 276 mg/dL — ABNORMAL HIGH (ref 70–99)

## 2019-12-22 MED ORDER — LEVETIRACETAM 100 MG/ML PO SOLN
500.0000 mg | Freq: Two times a day (BID) | ORAL | Status: DC
  Administered 2019-12-22 – 2019-12-29 (×14): 500 mg
  Filled 2019-12-22 (×14): qty 5

## 2019-12-22 NOTE — Progress Notes (Signed)
  NEUROSURGERY PROGRESS NOTE   No issues overnight.   EXAM:  BP (!) 164/78 (BP Location: Left Leg)   Pulse 99   Temp 100 F (37.8 C) (Axillary)   Resp 20   Ht 5\' 6"  (1.676 m)   Wt 78.6 kg   SpO2 98%   BMI 27.97 kg/m   Awake, trach Follows simple commands  IMPRESSION/PLAN 69 y.o. female  s/p clipping L MCA aneurysm. Neurologically stable - dispo planning

## 2019-12-22 NOTE — Progress Notes (Signed)
Occupational Therapy Treatment Patient Details Name: Rebecca Green MRN: [id number] DOB: 25-May-1951 Today's Date: 12/22/2019    History of present illness Pt is a 69 yo female with ho of HTN, CVAs and known L MCA aneurysm that became unresponsive on 6/6.  Pt now with L SAH with midline shift.  Pt had aneurysm clipped and ventriculostomy placed. Intubated 11/20/19 and trach placed on 6/17.  s/p G tube 12/07/19.   OT comments  Pt lethargic this pm with periods of alertness.  She followed one 1 step motor command, but did not follow any other commands.  She requires total A for all aspects of ADLs and functional mobility today.  She is moving bil UEs spontaneously, and spontaneously looking to Lt.  Will continue to follow.  Recommend SNF.   Follow Up Recommendations  SNF;Supervision/Assistance - 24 hour    Equipment Recommendations  None recommended by OT    Recommendations for Other Services      Precautions / Restrictions Precautions Precautions: Fall Precaution Comments: trach/trach collar, PEG       Mobility Bed Mobility Overal bed mobility: Needs Assistance Bed Mobility: Rolling Rolling: Total assist         General bed mobility comments: Pt able to assist with ~15% of task with rolling Lt and Rt   Transfers                 General transfer comment: unable to safely attempt with +1 assist     Balance                                           ADL either performed or assessed with clinical judgement   ADL Overall ADL's : Needs assistance/impaired     Grooming: Wash/dry hands;Bed level;Total assistance Grooming Details (indicate cue type and reason): Pt will spontaneously wipe face with mitten in place, but when asked to wash face, she resists, and was unable to complete the task with hand over hand assist q                                     Vision   Additional Comments: Pt spontaneously looking to Lt today    Perception      Praxis      Cognition Arousal/Alertness: Lethargic;Awake/alert Behavior During Therapy: Restless Overall Cognitive Status: Impaired/Different from baseline Area of Impairment: Attention                               General Comments: Pt very restless.  She followed one one step motor command to squeeze her left hand, with a 20 second delay.  Did not follow any other commands         Exercises     Shoulder Instructions       General Comments      Pertinent Vitals/ Pain       Pain Assessment: Faces Faces Pain Scale: No hurt  Home Living                                          Prior Functioning/Environment  Frequency  Min 2X/week        Progress Toward Goals  OT Goals(current goals can now be found in the care plan section)  Progress towards OT goals: Not progressing toward goals - comment (lethargy and communication deficits )     Plan Discharge plan remains appropriate    Co-evaluation                 AM-PAC OT "6 Clicks" Daily Activity     Outcome Measure   Help from another person eating meals?: Total Help from another person taking care of personal grooming?: Total Help from another person toileting, which includes using toliet, bedpan, or urinal?: Total Help from another person bathing (including washing, rinsing, drying)?: Total Help from another person to put on and taking off regular upper body clothing?: Total Help from another person to put on and taking off regular lower body clothing?: Total 6 Click Score: 6    End of Session Equipment Utilized During Treatment: Oxygen  OT Visit Diagnosis: Cognitive communication deficit (R41.841) Symptoms and signs involving cognitive functions: Nontraumatic SAH Hemiplegia - caused by: Nontraumatic SAH   Activity Tolerance Patient limited by lethargy;Other (comment) (impaired communication and cognition )   Patient Left in bed;with call  bell/phone within reach;with bed alarm set   Nurse Communication Mobility status        Time: 5894-8347 OT Time Calculation (min): 21 min  Charges: OT General Charges $OT Visit: 1 Visit OT Treatments $Therapeutic Activity: 8-22 mins  Rebecca Green OTR/L Acute Rehabilitation Services Pager [phone] Office [phone]    Rebecca Green 12/22/2019, 4:32 PM

## 2019-12-22 NOTE — Progress Notes (Signed)
Physical Therapy Treatment Patient Details Name: Rebecca Green MRN: [id number] DOB: 1950-11-27 Today's Date: 12/22/2019    History of Present Illness Pt is a 69 yo female with ho of HTN, CVAs and known L MCA aneurysm that became unresponsive on 6/6.  Pt now with L SAH with midline shift.  Pt had aneurysm clipped and ventriculostomy placed. Intubated 11/20/19 and trach placed on 6/17.  s/p G tube 12/07/19.    PT Comments    Pt is very restless today moving all over the bed.  She helped with movement once movement was initiated by PT, but I could not get her to follow any commands.  She was very distracted by lines, mittens throughout session.  She was moving both legs spontaneously right more briskly than left, so I attempted a stand or a pre-stand and she was unable to push up, but with a second person and knees blocked this could be a next step.  Pt was stable on 28% TC on 5 L.  PT will continue to follow acutely for safe mobility progression   Follow Up Recommendations  SNF     Equipment Recommendations  Wheelchair (measurements PT);Wheelchair cushion (measurements PT);Hospital bed;Other (comment)    Recommendations for Other Services Other (comment)     Precautions / Restrictions Precautions Precautions: Fall Precaution Comments: trach/trach collar, PEG    Mobility  Bed Mobility Overal bed mobility: Needs Assistance Bed Mobility: Supine to Sit;Sit to Supine     Supine to sit: Mod assist;HOB elevated Sit to supine: Mod assist   General bed mobility comments: Mod assist to come up to sitting EOB. Most assist to initiate movement of bil legs to EOB, but once there and hand hold provided, pt pulled up with mod assist.  Pt also able to attempt to return to supine, once trunk started toward the bed, she lifted bil legs with help.   Transfers Overall transfer level: Needs assistance Equipment used: 1 person hand held assist             General transfer comment: Attempted to  stand as pt kept trying to scoot, pushing through her arms and legs.  L leg appeared to be weaker, so PT on her left blocking her left leg and coming forward to attempt to stand x 3.  Pt unalbe.        Balance Overall balance assessment: Needs assistance Sitting-balance support: Feet supported;Bilateral upper extremity supported;No upper extremity supported;Single extremity supported Sitting balance-Leahy Scale: Poor Sitting balance - Comments: up to mod assist for sitting balance due to pt restlessly leaning all directions in sitting. Most assist needed to prevent tipping forward off of the side of the bed.    Standing balance support: Single extremity supported Standing balance-Leahy Scale: Zero Standing balance comment: unable to stand with max assist                            Cognition Arousal/Alertness: Awake/alert Behavior During Therapy: Restless;Impulsive;Flat affect Overall Cognitive Status: Impaired/Different from baseline Area of Impairment: Attention;Memory;Following commands;Safety/judgement;Awareness;Problem solving                   Current Attention Level: Focused Memory: Decreased recall of precautions;Decreased short-term memory Following Commands:  (did not follow any commands for me today) Safety/Judgement: Decreased awareness of safety;Decreased awareness of deficits Awareness: Intellectual Problem Solving: Slow processing;Difficulty sequencing;Requires tactile cues;Requires verbal cues General Comments: Pt restless, moving constantly trying to remove mittens from hands,  leaning in all directions to seemingly look for things on the ground, in the bed, too distracted to do much more than mobilize to EOB for me today.           General Comments General comments (skin integrity, edema, etc.): Pt on 28% TC and 5 L O2 VSS throughout and when I walked in her trach collar was not over her trach and she was still 94% on RA.        Pertinent  Vitals/Pain Pain Assessment: Faces Faces Pain Scale: No hurt           PT Goals (current goals can now be found in the care plan section) Acute Rehab PT Goals Patient Stated Goal: unable to state PT Goal Formulation: Patient unable to participate in goal setting Progress towards PT goals: Progressing toward goals    Frequency    Min 2X/week      PT Plan Current plan remains appropriate       AM-PAC PT "6 Clicks" Mobility   Outcome Measure  Help needed turning from your back to your side while in a flat bed without using bedrails?: A Lot Help needed moving from lying on your back to sitting on the side of a flat bed without using bedrails?: A Lot Help needed moving to and from a bed to a chair (including a wheelchair)?: Total Help needed standing up from a chair using your arms (e.g., wheelchair or bedside chair)?: Total Help needed to walk in hospital room?: Total Help needed climbing 3-5 steps with a railing? : Total 6 Click Score: 8    End of Session Equipment Utilized During Treatment: Oxygen Activity Tolerance: Other (comment) (limited by restlessness) Patient left: in bed;with call bell/phone within reach;with bed alarm set   PT Visit Diagnosis: Unsteadiness on feet (R26.81);Muscle weakness (generalized) (M62.81);Difficulty in walking, not elsewhere classified (R26.2);Hemiplegia and hemiparesis Hemiplegia - Right/Left: Right Hemiplegia - caused by: Nontraumatic SAH     Time: 9604-5409 PT Time Calculation (min) (ACUTE ONLY): 15 min  Charges:  $Therapeutic Activity: 8-22 mins                    Verdene Lennert, PT, DPT  Acute Rehabilitation [phone] pager [phone]) [phone] office

## 2019-12-22 NOTE — TOC Initial Note (Signed)
Transition of Care [facility]) - Initial/Assessment Note    Patient Details  Name: Rebecca Green MRN: [id number] Date of Birth: 1950-12-30  Transition of Care [facility]) CM/SW Contact:    Vinie Sill, [place] Phone Number: 12/22/2019, 11:34 AM  Clinical Narrative:                  CSW spoke with patient's daughter,Margo via phone. CSW introduced self and explained role. CSW discussed with Audelia Acton PT recommendation of short term rehab at [facility]. Audelia Acton was agreeable to SNF placement. CSW explained the SNF process and gave her the bed offers. She states she will review and get back with CSW by Sunday with SNF choice.   Thurmond Butts, MSW, [place] Clinical Social Worker    Expected Discharge Plan: Skilled Nursing Facility Barriers to Discharge: Continued Medical Work up   Patient Goals and CMS Choice   CMS Medicare.gov Compare Post Acute Care list provided to:: Patient Represenative (must comment) (daughter Audelia Acton) Choice offered to / list presented to : Adult Children  Expected Discharge Plan and Services Expected Discharge Plan: Skilled Nursing Facility In-house Referral: Clinical Social Work Discharge Planning Services: CM Consult Post Acute Care Choice: Long Term Acute Care (LTAC) Living arrangements for the past 2 months: [place]                                      Prior Living Arrangements/Services Living arrangements for the past 2 months: Single Family Home Lives with:: Self, Relatives Patient language and need for interpreter reviewed:: No        Need for Family Participation in Patient Care: Yes (Comment) Care giver support system in place?: Yes (comment)   Criminal Activity/Legal Involvement Pertinent to Current Situation/Hospitalization: No - Comment as needed  Activities of Daily Living Home Assistive Devices/Equipment: None ADL Screening (condition at time of admission) Patient's cognitive ability adequate to safely complete daily activities?: No Is  the patient deaf or have difficulty hearing?: No Does the patient have difficulty seeing, even when wearing glasses/contacts?: No Does the patient have difficulty concentrating, remembering, or making decisions?: Yes Patient able to express need for assistance with ADLs?: No Does the patient have difficulty dressing or bathing?: Yes Independently performs ADLs?: No Communication: Dependent Is this a change from baseline?: Change from baseline, expected to last >3 days Dressing (OT): Dependent Is this a change from baseline?: Change from baseline, expected to last >3 days Grooming: Dependent Is this a change from baseline?: Change from baseline, expected to last >3 days Feeding: Dependent Is this a change from baseline?: Change from baseline, expected to last >3 days Bathing: Dependent Is this a change from baseline?: Change from baseline, expected to last >3 days Toileting: Dependent Is this a change from baseline?: Change from baseline, expected to last >3days In/Out Bed: Dependent Is this a change from baseline?: Change from baseline, expected to last >3 days Walks in Home: Dependent Is this a change from baseline?: Change from baseline, expected to last >3 days Does the patient have difficulty walking or climbing stairs?: Yes Weakness of Legs: Both Weakness of Arms/Hands: Both  Permission Sought/Granted Permission sought to share information with : Family Supports Permission granted to share information with : Yes, Verbal Permission Granted  Share Information with NAME: [place] granted to share info w AGENCY: SNFs  Permission granted to share info w Relationship: daughter  Permission granted  to share info w Contact Information: [phone]  Emotional Assessment   Attitude/Demeanor/Rapport: Unable to Assess Affect (typically observed): Unable to Assess Orientation: : Oriented to Self Alcohol / Substance Use: Not Applicable Psych Involvement: No  (comment)  Admission diagnosis:  SAH (subarachnoid hemorrhage) ([place]) [I60.9] Aneurysmal subarachnoid hemorrhage ([place]) [I60.8] Patient Active Problem List   Diagnosis Date Noted  . Endotracheal tube present   . AKI (acute kidney injury) ([place])   . SAH (subarachnoid hemorrhage) ([place])   . Status post tracheostomy ([place])   . Acute respiratory failure ([place])   . Aneurysmal subarachnoid hemorrhage ([place]) 11/19/2019   PCP:  Lowella Dandy, NP Pharmacy:   [facility] [place], [address] AT [place] [address] [place] [address] Phone: [phone] Fax: [phone]     Social Determinants of Health (SDOH) Interventions    Readmission Risk Interventions No flowsheet data found.

## 2019-12-22 NOTE — Progress Notes (Signed)
  Speech Language Pathology Treatment: Rebecca Green Speaking valve  Patient Details Name: Rebecca Green MRN: [id number] DOB: [birth year] Today's Date: 12/22/2019 Time: 3428-7681 SLP Time Calculation (min) (ACUTE ONLY): 18 min  Assessment / Plan / Recommendation Clinical Impression  Pt restless today.  Repositioned with assist; right mitt removed and oral care provided with hand-over-hand assist given so pt could self suction/brush tongue. PMV placed, and pt made attempt to count "one, two, three" after verbal model, but removal of PMV led to release of back pressure in trach.  She was not able to achieve phonation, but her attempts to verbalize were an improvement since her last session. Repeated trials led to same outcome with poor upper airway access.  It's likely that presence of deflated cuff is just taking up too much space in trachea to allow adequate air to flow past trach and into upper airway.  If she can't be downsized, changing to a cuffless trach may provide some benefit. SLP will continue to follow for PMV and PO trials.  PMV with SLP only at this time.  HPI HPI: Pt is a 69 yo female presenting wiht AMS, L facial droop, and RUE weakness. CT head revealed large volume left sided SAH with 35mm midline shift s/p aneurysm clipping and EVD placement 6/7 (EVD d/c 6/27). She was intubated 6/7; trach placed 6/17. PEG 6/24. PMH includes: CVA (no residual deficits), seizures, HTN, DM II, L MCA aneurysm      SLP Plan  Continue with current plan of care       Recommendations  Diet recommendations: NPO   Consider downsize and/or at least change to a cuffless trach   Patient may use Passy-Muir Speech Valve: with SLP only         Oral Care Recommendations: Oral care QID Follow up Recommendations: Skilled Nursing facility Plan: Continue with current plan of care       GO                Rebecca Green 12/22/2019, 12:54 PM  Rebecca Green L. Rebecca Green, Rebecca Green Office number [phone] Pager [phone]

## 2019-12-23 DIAGNOSIS — I609 Nontraumatic subarachnoid hemorrhage, unspecified: Secondary | ICD-10-CM | POA: Diagnosis not present

## 2019-12-23 DIAGNOSIS — I6012 Nontraumatic subarachnoid hemorrhage from left middle cerebral artery: Secondary | ICD-10-CM | POA: Diagnosis not present

## 2019-12-23 LAB — GLUCOSE, CAPILLARY
Glucose-Capillary: 125 mg/dL — ABNORMAL HIGH (ref 70–99)
Glucose-Capillary: 141 mg/dL — ABNORMAL HIGH (ref 70–99)
Glucose-Capillary: 151 mg/dL — ABNORMAL HIGH (ref 70–99)
Glucose-Capillary: 157 mg/dL — ABNORMAL HIGH (ref 70–99)
Glucose-Capillary: 207 mg/dL — ABNORMAL HIGH (ref 70–99)
Glucose-Capillary: 248 mg/dL — ABNORMAL HIGH (ref 70–99)
Glucose-Capillary: 250 mg/dL — ABNORMAL HIGH (ref 70–99)

## 2019-12-23 NOTE — Progress Notes (Signed)
Subjective: Patient reports awake and alert. No acute events overnight were reported.   Objective: Vital signs in last 24 hours: Temp:  [98.1 F (36.7 C)-99.1 F (37.3 C)] 98.2 F (36.8 C) (07/10 0825) Pulse Rate:  [71-111] 104 (07/10 0825) Resp:  [17-28] 20 (07/10 0825) BP: (112-161)/(59-111) 152/97 (07/10 0825) SpO2:  [93 %-100 %] 95 % (07/10 0814) FiO2 (%):  [28 %-35 %] 35 % (07/10 0814)  Intake/Output from previous day: 07/09 0701 - 07/10 0700 In: -  Out: 1175 [Urine:1175] Intake/Output this shift: Total I/O In: -  Out: 250 [Urine:250]  Physical Exam: Patient is awake and making appropriate eye contact. She is able to follow simple commands and MAEW. Trach and PEG tube in place.   Lab Results: Recent Labs    12/21/19 0709  WBC 7.0  HGB 9.0*  HCT 28.7*  PLT 461*   BMET Recent Labs    12/21/19 0709  NA 137  K 5.1  CL 104  CO2 25  GLUCOSE 127*  BUN 29*  CREATININE 1.08*  CALCIUM 9.0    Studies/Results: No results found.  Assessment/Plan: The patient is s/p clipping L MCA aneurysm. Neurologically stable. Continue supportive care and working with therapies.  - dispo planning     LOS: 54 days    Peggyann Shoals, MD 12/23/2019, 10:51 AM

## 2019-12-24 DIAGNOSIS — I6012 Nontraumatic subarachnoid hemorrhage from left middle cerebral artery: Secondary | ICD-10-CM | POA: Diagnosis not present

## 2019-12-24 DIAGNOSIS — I609 Nontraumatic subarachnoid hemorrhage, unspecified: Secondary | ICD-10-CM | POA: Diagnosis not present

## 2019-12-24 LAB — GLUCOSE, CAPILLARY
Glucose-Capillary: 189 mg/dL — ABNORMAL HIGH (ref 70–99)
Glucose-Capillary: 239 mg/dL — ABNORMAL HIGH (ref 70–99)
Glucose-Capillary: 245 mg/dL — ABNORMAL HIGH (ref 70–99)
Glucose-Capillary: 217 mg/dL — ABNORMAL HIGH (ref 70–99)
Glucose-Capillary: 126 mg/dL — ABNORMAL HIGH (ref 70–99)
Glucose-Capillary: 199 mg/dL — ABNORMAL HIGH (ref 70–99)
Glucose-Capillary: 188 mg/dL — ABNORMAL HIGH (ref 70–99)

## 2019-12-24 MED ORDER — MELATONIN 3 MG PO TABS
3.0000 mg | ORAL_TABLET | Freq: Every day | ORAL | Status: DC
  Administered 2019-12-24 – 2019-12-28 (×5): 3 mg
  Filled 2019-12-24 (×5): qty 1

## 2019-12-24 NOTE — Progress Notes (Signed)
Subjective: Patient reports awake and alert. No acute events overnight.    Objective: Vital signs in last 24 hours: Temp:  [97.4 F (36.3 C)-99.5 F (37.5 C)] 97.4 F (36.3 C) (07/11 0807) Pulse Rate:  [70-99] 80 (07/11 0807) Resp:  [16-26] 20 (07/11 0807) BP: (124-152)/(64-129) 126/73 (07/11 0807) SpO2:  [91 %-100 %] 99 % (07/11 0807) FiO2 (%):  [28 %-35 %] 30 % (07/11 0807) Weight:  [76 kg] 76 kg (07/11 0455)  Intake/Output from previous day: 07/10 0701 - 07/11 0700 In: -  Out: 1150 [Urine:1150] Intake/Output this shift: No intake/output data recorded.  Physical Exam: Patient is awake and making appropriate eye contact. She is able to follow simple commands and MAEW. Trach and PEG tube in place.   Lab Results: No results for input(s): WBC, HGB, HCT, PLT in the last 72 hours. BMET No results for input(s): NA, K, CL, CO2, GLUCOSE, BUN, CREATININE, CALCIUM in the last 72 hours.  Studies/Results: No results found.  Assessment/Plan: The patient is s/p clipping L MCA aneurysm. Neurologically stable. Continue supportive care and working with therapies.  - dispo planning    LOS: 35 days    Peggyann Shoals, MD 12/24/2019, 9:13 AM

## 2019-12-25 DIAGNOSIS — J9621 Acute and chronic respiratory failure with hypoxia: Secondary | ICD-10-CM | POA: Diagnosis not present

## 2019-12-25 DIAGNOSIS — J9601 Acute respiratory failure with hypoxia: Secondary | ICD-10-CM | POA: Diagnosis not present

## 2019-12-25 DIAGNOSIS — I6200 Nontraumatic subdural hemorrhage, unspecified: Secondary | ICD-10-CM | POA: Diagnosis not present

## 2019-12-25 DIAGNOSIS — I609 Nontraumatic subarachnoid hemorrhage, unspecified: Secondary | ICD-10-CM | POA: Diagnosis not present

## 2019-12-25 DIAGNOSIS — I6012 Nontraumatic subarachnoid hemorrhage from left middle cerebral artery: Secondary | ICD-10-CM | POA: Diagnosis not present

## 2019-12-25 DIAGNOSIS — Z20822 Contact with and (suspected) exposure to covid-19: Secondary | ICD-10-CM | POA: Diagnosis not present

## 2019-12-25 LAB — BASIC METABOLIC PANEL
Anion gap: 7 (ref 5–15)
Anion gap: 11 (ref 5–15)
BUN: 30 mg/dL — ABNORMAL HIGH (ref 8–23)
BUN: 30 mg/dL — ABNORMAL HIGH (ref 8–23)
CO2: 27 mmol/L (ref 22–32)
CO2: 23 mmol/L (ref 22–32)
Calcium: 9.4 mg/dL (ref 8.9–10.3)
Calcium: 9.6 mg/dL (ref 8.9–10.3)
Chloride: 100 mmol/L (ref 98–111)
Chloride: 99 mmol/L (ref 98–111)
Creatinine, Ser: 1.29 mg/dL — ABNORMAL HIGH (ref 0.44–1.00)
Creatinine, Ser: 1.23 mg/dL — ABNORMAL HIGH (ref 0.44–1.00)
GFR calc Af Amer: 49 mL/min — ABNORMAL LOW (ref >60)
GFR calc Af Amer: 51 mL/min — ABNORMAL LOW (ref >60)
GFR calc non Af Amer: 42 mL/min — ABNORMAL LOW (ref >60)
GFR calc non Af Amer: 44 mL/min — ABNORMAL LOW (ref >60)
Glucose, Bld: 218 mg/dL — ABNORMAL HIGH (ref 70–99)
Glucose, Bld: 176 mg/dL — ABNORMAL HIGH (ref 70–99)
Potassium: 6.1 mmol/L — ABNORMAL HIGH (ref 3.5–5.1)
Potassium: 5.4 mmol/L — ABNORMAL HIGH (ref 3.5–5.1)
Sodium: 134 mmol/L — ABNORMAL LOW (ref 135–145)
Sodium: 133 mmol/L — ABNORMAL LOW (ref 135–145)

## 2019-12-25 LAB — CBC
HCT: 29.4 % — ABNORMAL LOW (ref 36.0–46.0)
Hemoglobin: 9.2 g/dL — ABNORMAL LOW (ref 12.0–15.0)
MCH: 29.6 pg (ref 26.0–34.0)
MCHC: 31.3 g/dL (ref 30.0–36.0)
MCV: 94.5 fL (ref 80.0–100.0)
Platelets: 456 10*3/uL — ABNORMAL HIGH (ref 150–400)
RBC: 3.11 MIL/uL — ABNORMAL LOW (ref 3.87–5.11)
RDW: 13.7 % (ref 11.5–15.5)
WBC: 8.8 10*3/uL (ref 4.0–10.5)
nRBC: 0 % (ref 0.0–0.2)

## 2019-12-25 LAB — GLUCOSE, CAPILLARY
Glucose-Capillary: 179 mg/dL — ABNORMAL HIGH (ref 70–99)
Glucose-Capillary: 188 mg/dL — ABNORMAL HIGH (ref 70–99)
Glucose-Capillary: 198 mg/dL — ABNORMAL HIGH (ref 70–99)
Glucose-Capillary: 259 mg/dL — ABNORMAL HIGH (ref 70–99)
Glucose-Capillary: 148 mg/dL — ABNORMAL HIGH (ref 70–99)
Glucose-Capillary: 76 mg/dL (ref 70–99)
Glucose-Capillary: 95 mg/dL (ref 70–99)

## 2019-12-25 MED ORDER — APIXABAN 5 MG PO TABS
5.0000 mg | ORAL_TABLET | Freq: Two times a day (BID) | ORAL | Status: DC
  Administered 2019-12-25 – 2019-12-29 (×8): 5 mg
  Filled 2019-12-25 (×8): qty 1

## 2019-12-25 MED ORDER — FUROSEMIDE 10 MG/ML IJ SOLN
40.0000 mg | Freq: Once | INTRAMUSCULAR | Status: AC
  Administered 2019-12-25: 40 mg via INTRAVENOUS
  Filled 2019-12-25: qty 4

## 2019-12-25 MED ORDER — CALCIUM GLUCONATE-NACL 1-0.675 GM/50ML-% IV SOLN
1.0000 g | Freq: Once | INTRAVENOUS | Status: AC
  Administered 2019-12-25: 1000 mg via INTRAVENOUS
  Filled 2019-12-25: qty 50

## 2019-12-25 MED ORDER — APIXABAN 5 MG PO TABS: 5.0000 mg | ORAL_TABLET | Freq: Two times a day (BID) | ORAL | Status: DC

## 2019-12-25 MED ORDER — SODIUM ZIRCONIUM CYCLOSILICATE 10 G PO PACK
10.0000 g | PACK | Freq: Three times a day (TID) | ORAL | Status: AC
  Administered 2019-12-25 – 2019-12-26 (×2): 10 g
  Filled 2019-12-25 (×2): qty 1

## 2019-12-25 MED ORDER — SODIUM ZIRCONIUM CYCLOSILICATE 10 G PO PACK
10.0000 g | PACK | Freq: Three times a day (TID) | ORAL | Status: DC
  Administered 2019-12-25: 10 g via ORAL
  Filled 2019-12-25 (×2): qty 1

## 2019-12-25 MED ORDER — FREE WATER
100.0000 mL | Freq: Four times a day (QID) | Status: DC
  Administered 2019-12-25 – 2019-12-29 (×15): 100 mL

## 2019-12-25 NOTE — Progress Notes (Signed)
Patient had a bout of emesis. Tube feeding stopped and Elink notified. Will continue to monitor.

## 2019-12-25 NOTE — Progress Notes (Addendum)
K returned 6 Sodium low Reduce FWF Recheck K and give calcium, lokelma  Addendum: EKG benign T waves, repeat K lower, no further interventions other than above.

## 2019-12-25 NOTE — Progress Notes (Addendum)
NAME:  Rebecca Green, MRN:  [id number], DOB:  Dec 21, 1950, LOS: 18 ADMISSION DATE:  11/19/2019, CONSULTATION DATE:  11/20/19 REFERRING MD:  Zada Finders, CHIEF COMPLAINT:  SAH    Brief History   69 year old female with PMH significant for seizures, previous CVA's, HTN, and known left MCA aneurysm who reports for sudden unresponsiveness, left-sided facial droop, and right upper extremity weakness. CT Head revealed large volume left sided SAH with 44mm midline shift.   Past Medical History  CVA (No residual deficits) HTN Seizures DM II   Significant Hospital Events   6/06 Ad[facility] to [facility] 6/07 Aneurysm clipped. Acute decompensation - right frontal EVD placed. Intubated for airway protection 6/11 fever 101.3 6/12 change to pressure control 6/14 on PS wean 6/17 Trach Placed   Consults:  Neurology Neurosurgery  Procedures:  6/7 EVD >>6/27 6/7 ETT > 6/17 6/7 Left pterional craniotomy for clipping of MCA aneurysm 6/17 Trach >>   Significant Diagnostic Tests:  6/7 CT Head > Large volume SAH overlying left cerebral hemisphere with  left to right midline shift. New hydrocephalus of lateral and third vents. SAH reflux into occipital horns of lateral vents.  6/6 CTA Neck > Patent common and internal carotids without significant stenosis. Mixed plaque within proximal left ICA. Patent vertebrals.  6/6 CTA Head > 3x2 mm left MCA bifurcation aneurysm (increased in size from April 2020). No intracranial LVO. High grade focal stenosis within inferior division mid M2 left MCA 6/9 renal u/s > Right renal cysts without complicating factors.  No hydronephrosis. 6/19 renal u/s> No hydronephrosis or shadowing stone. 2. Stable right renal upper pole cyst.  Micro Data:  6/6 SARS CoV2 > Negative 6/8 resp >  Normal oral flora 6/17 resp > Few Staph Epidermidis > resistant to erythromycin, oxacillin, and Bactrim 6/19 blood cultures 1>   Antimicrobials:  6/7 Ancef x1 6/7 Rocephin > 6/10 Ancef  6/18 x 1 6/18 vancomycin 6/20 6/20 Zyvox 6/22  Interim history/subjective:  No events, remains on TC.  Objective   Blood pressure (!) 149/62, pulse 90, temperature 99.7 F (37.6 C), temperature source Oral, resp. rate 20, height 5\' 6"  (1.676 m), weight 76.8 kg, SpO2 100 %.    FiO2 (%):  [28 %-30 %] 28 %   Intake/Output Summary (Last 24 hours) at 12/25/2019 1045 Last data filed at 12/24/2019 1700 Gross per 24 hour  Intake 360 ml  Output 200 ml  Net 160 ml   Filed Weights   12/22/19 0452 12/24/19 0455 12/25/19 0500  Weight: 78.6 kg 76 kg 76.8 kg    Examination: GEN: awake lying in bed HEENT: trach in place with copious mucoid secretions CV: RRR, ext warm PULM: Rhonci, no accessory muscle use GI: Soft, +BS EXT: Trace edema NEURO: She does move ext, intermittently to command PSYCH: RASS -1 SKIN: No rashes  No new imaging or labs  Assessment & Plan:   Acute hypoxic respiratory failure with compromised airway in setting of SAH.  Ongoing issues with mental status and secretions. - Continue trach collar as tolerated. - Continue routine trach care. - Switch to cuffless distal XLT - Continue scopolamine. - Patient's mental status and secretions does not allow for capping trial.  - CXR PRN  Acute left-sided SAH with hydrocephalus - Status post clipping and EVD (now removed). - Per neurosurgery.  LUE DVT. - H/H stable, okay to transition to eliquis   PCCM will see 1 - 2 times per week.  Please call if needs  arise sooner.   Erskine Emery MD PCCM

## 2019-12-25 NOTE — Consult Note (Signed)
  NEUROSURGERY PROGRESS NOTE   ?aspiration overnight. PCCM aware and managing  EXAM:  BP (!) 155/59   Pulse 99   Temp 98.4 F (36.9 C)   Resp 17   Ht 5\' 6"  (1.676 m)   Wt 76.8 kg   SpO2 93%   BMI 27.33 kg/m   Awakens to voice Trach Intermittently following commands  IMPRESSION/PLAN 69 y.o. female s/p slipping L MCA aneurysm. Stable overall neurologically - continue supportive care - appreciate PCCM management of patient

## 2019-12-25 NOTE — Progress Notes (Signed)
Patient had another bout of emesis. Elink notified again. Will continue to monitor.

## 2019-12-25 NOTE — Progress Notes (Signed)
Pt trach changed to #6 [place] Uncuffed XLT Distal.  CO2 detector color change positive.  New ties and dressing applied.  Pt tolerated procedure well without distress.

## 2019-12-25 NOTE — Progress Notes (Signed)
Inpatient Diabetes Program Recommendations  AACE/ADA: New Consensus Statement on Inpatient Glycemic Control (2015)  Target Ranges:  Prepandial:   less than 140 mg/dL      Peak postprandial:   less than 180 mg/dL (1-2 hours)      Critically ill patients:  140 - 180 mg/dL   Lab Results  Component Value Date   GLUCAP 198 (H) 12/25/2019   HGBA1C 7.8 (H) 11/21/2019    Review of Glycemic Control Results for Rebecca Green, Rebecca Green (MRN [id number]) as of 12/25/2019 10:46  Ref. Range 12/24/2019 22:54 12/25/2019 02:33 12/25/2019 05:23 12/25/2019 07:00  Glucose-Capillary Latest Ref Range: 70 - 99 mg/dL 188 (H) 179 (H) 188 (H) 198 (H)   Current Insulin Orders: Novolog Sensitive Correction Scale/ SSI (0-9 units) TID                                       Levemir 10 units Daily   Patient missed AM correction. Secure chat sent to RN and verify tube feeds.   Consider the following: - Novolog SSI regimen to the "sensitive" 0-9 units or "Very Sensitive" scale (0-6 units) Q4 hours.  Thanks, Bronson Curb, MSN, RNC-OB Diabetes Coordinator [phone] (8a-5p)

## 2019-12-26 DIAGNOSIS — I609 Nontraumatic subarachnoid hemorrhage, unspecified: Secondary | ICD-10-CM | POA: Diagnosis not present

## 2019-12-26 DIAGNOSIS — I6012 Nontraumatic subarachnoid hemorrhage from left middle cerebral artery: Secondary | ICD-10-CM | POA: Diagnosis not present

## 2019-12-26 LAB — GLUCOSE, CAPILLARY
Glucose-Capillary: 112 mg/dL — ABNORMAL HIGH (ref 70–99)
Glucose-Capillary: 70 mg/dL (ref 70–99)
Glucose-Capillary: 71 mg/dL (ref 70–99)
Glucose-Capillary: 72 mg/dL (ref 70–99)
Glucose-Capillary: 81 mg/dL (ref 70–99)
Glucose-Capillary: 86 mg/dL (ref 70–99)
Glucose-Capillary: 87 mg/dL (ref 70–99)
Glucose-Capillary: 92 mg/dL (ref 70–99)

## 2019-12-26 NOTE — Discharge Instructions (Signed)

## 2019-12-26 NOTE — Progress Notes (Signed)
Occupational Therapy Treatment Patient Details Name: Rebecca Green MRN: [id number] DOB: March 01, 1951 Today's Date: 12/26/2019    History of present illness Pt is a 69 yo female with ho of HTN, CVAs and known L MCA aneurysm that became unresponsive on 6/6.  Pt now with L SAH with midline shift.  Pt had aneurysm clipped and ventriculostomy placed. Intubated 11/20/19 and trach placed on 6/17.  s/p G tube 12/07/19.   OT comments  Pt making steady progress towards OT goals this session. Pt continues to present with impaired balance, cognitive deficits, decreased strength and activity tolerance impacting pts ability to complete BADLs. Pt restless throughout session, able to follow one commad during session but otherwise required total A to complete ADLs. Pt was able to stand this session with MOD A +2, x2 trials. DC plan below remains appropriate, will follow acutely per POC.    Follow Up Recommendations  SNF;Supervision/Assistance - 24 hour    Equipment Recommendations  None recommended by OT    Recommendations for Other Services      Precautions / Restrictions Precautions Precautions: Fall Precaution Comments: trach/trach collar, PEG, bilateral mits Restrictions Weight Bearing Restrictions: No       Mobility Bed Mobility Overal bed mobility: Needs Assistance Bed Mobility: Supine to Sit;Sit to Supine     Supine to sit: +2 for physical assistance;Mod assist;HOB elevated Sit to supine: Max assist;+2 for physical assistance;HOB elevated   General bed mobility comments: pt impulsively assisting with coming to EOB but required MOD A to elevate trunk into sitting. MAX A to return pt to supine needing assist to elevate BLEs and lower trunk  Transfers Overall transfer level: Needs assistance Equipment used: 2 person hand held assist;Rolling walker (2 wheeled) Transfers: Sit to/from Stand Sit to Stand: Mod assist;+2 physical assistance         General transfer comment: pt able to stand  with MOD hand held assist +2. pt completed x2 trials. attempted stand with use of RW however limited sucess d/t mits. pt noted to lock out LLE in standing    Balance Overall balance assessment: Needs assistance Sitting-balance support: Feet supported;Bilateral upper extremity supported;Single extremity supported Sitting balance-Leahy Scale: Poor Sitting balance - Comments: reliant on at least MIN A for sitting balance EOB   Standing balance support: Bilateral upper extremity supported Standing balance-Leahy Scale: Poor Standing balance comment: reliant on external assist                           ADL either performed or assessed with clinical judgement   ADL Overall ADL's : Needs assistance/impaired                                     Functional mobility during ADLs: Moderate assistance;+2 for physical assistance;Rolling walker (sit<>stand only) General ADL Comments: pt currently requires total A for all ADLs, able to stand with MOD A +2.     Vision   Additional Comments: able to spontaneously gaze R<>L, does not appear to have a gaze preference   Perception     Praxis      Cognition Arousal/Alertness: Awake/alert Behavior During Therapy: Restless;Impulsive Overall Cognitive Status: Impaired/Different from baseline Area of Impairment: Following commands;Attention;Safety/judgement;Awareness;Problem solving                   Current Attention Level: Focused   Following Commands: Follows one step  commands with increased time (followed one command of "slide foot back") Safety/Judgement: Decreased awareness of safety Awareness: Intellectual Problem Solving: Slow processing;Difficulty sequencing;Requires tactile cues;Requires verbal cues General Comments: pt very restless during session. continues to present with decreased awareness and impaired ability to follow commands. did follow one command during session        Exercises     Shoulder  Instructions       General Comments pt on 28 % FiO2 28 LPM with O2 WFL. Pt HR did increase to 130 bpm with mobility    Pertinent Vitals/ Pain       Pain Assessment: Faces Faces Pain Scale: No hurt Pain Location: restless Pain Descriptors / Indicators: Restless Pain Intervention(s): Limited activity within patient's tolerance;Monitored during session;Repositioned  Home Living                                          Prior Functioning/Environment              Frequency  Min 2X/week        Progress Toward Goals  OT Goals(current goals can now be found in the care plan section)  Progress towards OT goals: Progressing toward goals  Acute Rehab OT Goals Patient Stated Goal: unable to state OT Goal Formulation: Patient unable to participate in goal setting Time For Goal Achievement: 01/01/20 Potential to Achieve Goals: Poor  Plan Discharge plan remains appropriate;Frequency remains appropriate    Co-evaluation      Reason for Co-Treatment: Complexity of the patient's impairments (multi-system involvement);For patient/therapist safety;To address functional/ADL transfers;Necessary to address cognition/behavior during functional activity   OT goals addressed during session: ADL's and self-care      AM-PAC OT "6 Clicks" Daily Activity     Outcome Measure   Help from another person eating meals?: Total Help from another person taking care of personal grooming?: Total Help from another person toileting, which includes using toliet, bedpan, or urinal?: Total Help from another person bathing (including washing, rinsing, drying)?: Total Help from another person to put on and taking off regular upper body clothing?: Total Help from another person to put on and taking off regular lower body clothing?: Total 6 Click Score: 6    End of Session Equipment Utilized During Treatment: Gait belt;Rolling walker;Other (comment)  OT Visit Diagnosis: Cognitive  communication deficit (R41.841) Symptoms and signs involving cognitive functions: Nontraumatic SAH Hemiplegia - Right/Left: Right Hemiplegia - caused by: Nontraumatic SAH   Activity Tolerance Patient tolerated treatment well   Patient Left in bed;with call bell/phone within reach;with bed alarm set   Nurse Communication Mobility status        Time: 3254-9826 OT Time Calculation (min): 9 min  Charges: OT General Charges $OT Visit: 1 Visit OT Treatments $Therapeutic Activity: 8-22 mins  Lanier Clam., COTA/L Acute Rehabilitation Services [phone]   Ihor Gully 12/26/2019, 1:30 PM

## 2019-12-26 NOTE — Progress Notes (Signed)
Spoke with CCM provider Dr. Chase Caller and given CBGs were in the 70s yesterday will discontinue levemir 10 units daily and monitor CBGs.   Cristela Felt, PharmD Clinical Pharmacist

## 2019-12-26 NOTE — Progress Notes (Signed)
  Speech Language Pathology Treatment: [name] Speaking valve  Patient Details Name: Rebecca Green MRN: [id number] DOB: [birth year] Today's Date: 12/26/2019 Time: 3335-4562 SLP Time Calculation (min) (ACUTE ONLY): 15 min  Assessment / Plan / Recommendation Clinical Impression  Pt not has a cuffless trach with improved tolerance for PMV placement. She wore the valve for almost 10 minutes with no evidence of back pressure developing. She produces intermittent sounds, but no clear speech sounds spontaneously or to repetition. No verbalizations noted despite cues and attempts at automatic speech tasks. SLP played different types of music but not able to identify a familiar song to try to further elicit language. Given cognitive-linguistic status and amount of secretions would continue to use PMV during SLP sessions only but with potential to liberalize this recommendation now that she has her cuffless trach if she can demonstrate her ability to wear it for longer intervals.    HPI HPI: Pt is a 69 yo female presenting wiht AMS, L facial droop, and RUE weakness. CT head revealed large volume left sided SAH with 82mm midline shift s/p aneurysm clipping and EVD placement 6/7 (EVD d/c 6/27). She was intubated 6/7; trach placed 6/17. PEG 6/24. PMH includes: CVA (no residual deficits), seizures, HTN, DM II, L MCA aneurysm      SLP Plan  Continue with current plan of care       Recommendations  Diet recommendations: NPO Medication Administration: Via alternative means      Patient may use Passy-Muir Speech Valve: with SLP only         Oral Care Recommendations: Oral care QID Follow up Recommendations: Skilled Nursing facility SLP Visit Diagnosis: Aphonia (R49.1) Plan: Continue with current plan of care       GO                Osie Bond., M.A. [place] Acute Rehabilitation Services Pager [phone] Office [phone]  12/26/2019, 11:06 AM

## 2019-12-26 NOTE — Progress Notes (Signed)
Physical Therapy Treatment Patient Details Name: Rebecca Green MRN: [id number] DOB: 11-Jan-1951 Today's Date: 12/26/2019    History of Present Illness Pt is a 69 yo female with ho of HTN, CVAs and known L MCA aneurysm that became unresponsive on 6/6.  Pt now with L SAH with midline shift.  Pt had aneurysm clipped and ventriculostomy placed. Intubated 11/20/19 and trach placed on 6/17.  s/p G tube 12/07/19.    PT Comments    Patient progressing slowly towards PT goals. Pt restless upon PT arrival. Overlapped session with OTA to safely perform functional mobility. Able to stand from EOB x2 with Mod A of 2; locking out BLEs to prevent knee buckling. Follows a few commands with increased time. Fatigues quickly with little activity. Pt on 28% Fi02 trach collar 5L 02. HR up to 139 bpm with activity. When mitt was taken off, pt perseverating on scratching eye so needed mitt replaced for fear of hurting self. Will continue to follow.   Follow Up Recommendations  SNF     Equipment Recommendations  Wheelchair (measurements PT);Wheelchair cushion (measurements PT);Hospital bed;Other (comment)    Recommendations for Other Services       Precautions / Restrictions Precautions Precautions: Fall Precaution Comments: trach collar, PEG, bilateral mits Restrictions Weight Bearing Restrictions: No    Mobility  Bed Mobility Overal bed mobility: Needs Assistance Bed Mobility: Supine to Sit     Supine to sit: +2 for physical assistance;Mod assist;HOB elevated Sit to supine: Max assist;+2 for physical assistance;HOB elevated   General bed mobility comments: Pt reaching for therapist's hand to assist with pulling trunk up to sitting; impulsive. Assist needed to bring BLEs into bed and lower trunk to return to supine.  Transfers Overall transfer level: Needs assistance Equipment used: Rolling walker (2 wheeled);2 person hand held assist Transfers: Sit to/from Stand Sit to Stand: Mod assist;+2  physical assistance         General transfer comment: assist of 2 to stand from EOB with BLEs locked out into knee extension with posterior lean through hips. Stood Gaffer.  Ambulation/Gait             General Gait Details: Unable   Marine scientist Rankin (Stroke Patients Only) Modified Rankin (Stroke Patients Only) Pre-Morbid Rankin Score: No significant disability Modified Rankin: Severe disability     Balance Overall balance assessment: Needs assistance Sitting-balance support: Feet supported;Bilateral upper extremity supported;Single extremity supported Sitting balance-Leahy Scale: Poor Sitting balance - Comments: reliant on at least MIN A for sitting balance EOB; plopping forward onto thighs and backwards without warning Postural control:  (anterior lean) Standing balance support: During functional activity;Bilateral upper extremity supported Standing balance-Leahy Scale: Poor Standing balance comment: reliant on external assistw with LEs locked out                            Cognition Arousal/Alertness: Awake/alert Behavior During Therapy: Restless;Impulsive Overall Cognitive Status: Difficult to assess Area of Impairment: Following commands;Attention;Safety/judgement;Awareness;Problem solving                   Current Attention Level: Focused Memory: Decreased recall of precautions;Decreased short-term memory Following Commands: Follows one step commands with increased time Safety/Judgement: Decreased awareness of safety Awareness: Intellectual Problem Solving: Slow processing;Difficulty sequencing;Requires tactile cues;Requires verbal cues General Comments: pt very restless during session. continues to present with decreased  awareness and impaired ability to follow commands. did follow a few commands during session with delayed response time.      Exercises      General Comments General comments  (skin integrity, edema, etc.): Pt on 28% Fi02 trach collar, 5L 02. HR up to 139 bpm with activity.      Pertinent Vitals/Pain Pain Assessment: Faces Faces Pain Scale: No hurt    Home Living                      Prior Function            PT Goals (current goals can now be found in the care plan section) Acute Rehab PT Goals Patient Stated Goal: unable to state Progress towards PT goals: Progressing toward goals (slowly)    Frequency    Min 2X/week      PT Plan Current plan remains appropriate    Co-evaluation   Reason for Co-Treatment: Complexity of the patient's impairments (multi-system involvement);For patient/therapist safety;To address functional/ADL transfers;Necessary to address cognition/behavior during functional activity   OT goals addressed during session: ADL's and self-care      AM-PAC PT "6 Clicks" Mobility   Outcome Measure  Help needed turning from your back to your side while in a flat bed without using bedrails?: A Lot Help needed moving from lying on your back to sitting on the side of a flat bed without using bedrails?: Total Help needed moving to and from a bed to a chair (including a wheelchair)?: Total Help needed standing up from a chair using your arms (e.g., wheelchair or bedside chair)?: Total Help needed to walk in hospital room?: Total Help needed climbing 3-5 steps with a railing? : Total 6 Click Score: 7    End of Session Equipment Utilized During Treatment: Oxygen (trach collar 28% Fi02, 5L 02) Activity Tolerance: Patient limited by fatigue Patient left: in bed;with call bell/phone within reach;with bed alarm set;with restraints reapplied Nurse Communication: Mobility status;Need for lift equipment PT Visit Diagnosis: Unsteadiness on feet (R26.81);Muscle weakness (generalized) (M62.81);Difficulty in walking, not elsewhere classified (R26.2) Hemiplegia - Right/Left: Right Hemiplegia - caused by: Nontraumatic SAH      Time: 4742-5956 PT Time Calculation (min) (ACUTE ONLY): 11 min  Charges:  $Therapeutic Activity: 8-22 mins                     Marisa Severin, PT, DPT Acute Rehabilitation Services Pager [phone] Office [place] 12/26/2019, 4:18 PM

## 2019-12-26 NOTE — Progress Notes (Signed)
  NEUROSURGERY PROGRESS NOTE   No issues overnight.  EXAM:  BP 136/64   Pulse 90   Temp 97.7 F (36.5 C) (Axillary)   Resp 19   Ht 5\' 6"  (1.676 m)   Wt 75.1 kg   SpO2 97%   BMI 26.72 kg/m   Awake, alert Trach Moves BUE spontaenously Follows simple commands  IMPRESSION/PLAN 69 y.o. female s/p clipping L MCA aneurysm.  stable. - dispo plan

## 2019-12-27 DIAGNOSIS — I609 Nontraumatic subarachnoid hemorrhage, unspecified: Secondary | ICD-10-CM | POA: Diagnosis not present

## 2019-12-27 DIAGNOSIS — I6012 Nontraumatic subarachnoid hemorrhage from left middle cerebral artery: Secondary | ICD-10-CM | POA: Diagnosis not present

## 2019-12-27 LAB — GLUCOSE, CAPILLARY
Glucose-Capillary: 109 mg/dL — ABNORMAL HIGH (ref 70–99)
Glucose-Capillary: 123 mg/dL — ABNORMAL HIGH (ref 70–99)
Glucose-Capillary: 125 mg/dL — ABNORMAL HIGH (ref 70–99)
Glucose-Capillary: 128 mg/dL — ABNORMAL HIGH (ref 70–99)
Glucose-Capillary: 133 mg/dL — ABNORMAL HIGH (ref 70–99)
Glucose-Capillary: 142 mg/dL — ABNORMAL HIGH (ref 70–99)
Glucose-Capillary: 65 mg/dL — ABNORMAL LOW (ref 70–99)
Glucose-Capillary: 98 mg/dL (ref 70–99)

## 2019-12-27 LAB — BASIC METABOLIC PANEL
Anion gap: 12 (ref 5–15)
BUN: 34 mg/dL — ABNORMAL HIGH (ref 8–23)
CO2: 22 mmol/L (ref 22–32)
Calcium: 9.3 mg/dL (ref 8.9–10.3)
Chloride: 102 mmol/L (ref 98–111)
Creatinine, Ser: 1.16 mg/dL — ABNORMAL HIGH (ref 0.44–1.00)
GFR calc Af Amer: 55 mL/min — ABNORMAL LOW (ref >60)
GFR calc non Af Amer: 48 mL/min — ABNORMAL LOW (ref >60)
Glucose, Bld: 79 mg/dL (ref 70–99)
Potassium: 4.2 mmol/L (ref 3.5–5.1)
Sodium: 136 mmol/L (ref 135–145)

## 2019-12-27 MED ORDER — METOCLOPRAMIDE HCL 5 MG/ML IJ SOLN
10.0000 mg | Freq: Four times a day (QID) | INTRAMUSCULAR | Status: DC
  Administered 2019-12-27 – 2019-12-29 (×7): 10 mg via INTRAVENOUS
  Filled 2019-12-27 (×8): qty 2

## 2019-12-27 NOTE — Progress Notes (Addendum)
Hypoglycemic Event  CBG: 65 at 0358  Treatment: 12.5g D50 administered per protocol at 0412  Symptoms: pt asymptomatic   Follow-up CBG: TDSK:[id number] CBG Result:133  Possible Reasons for Event: Tube feedings have been stopped due to aspiration event, patient NPO  Comments/MD notified: Intervention successful, hypoglycemia resolved.     Rebecca Green

## 2019-12-27 NOTE — TOC Progression Note (Signed)
Transition of Care [facility]) - Progression Note    Patient Details  Name: Rebecca Green MRN: [id number] Date of Birth: [birth year]  Transition of Care [facility]) CM/SW [place], [place] Phone Number: 12/27/2019, 5:38 PM  Clinical Narrative:     Patient's daughter confirmed she wants [facility] confirmed bed offer-   Thurmond Butts, MSW, [place] Clinical Social Worker   Expected Discharge Plan: Skilled Nursing Facility Barriers to Discharge: Continued Medical Work up  Expected Discharge Plan and Services Expected Discharge Plan: [place] In-house Referral: Clinical Social Work Discharge Planning Services: CM Consult Post Acute Care Choice: Long Term Acute Care (LTAC) Living arrangements for the past 2 months: Single Family Home                                       Social Determinants of Health (SDOH) Interventions    Readmission Risk Interventions No flowsheet data found.

## 2019-12-27 NOTE — Progress Notes (Signed)
Nutrition Follow-up  DOCUMENTATION CODES:   Not applicable  INTERVENTION:  Once able to restart tube feeding,  Recommend Jevity 1.2 formula via PEG at starting volume of 30 ml/hr and increase by 10 ml every 4 hours to goal rate of 60 ml/hr.   Provide 45 ml Prosource TF TID per tube.   Free water flushes of 100 ml every 6 hours per tube. (MD to adjust as appropriate).  Tube feeding to provide 1848 kcal, 113 grams of protein, 1566 ml free water.   NUTRITION DIAGNOSIS:   Inadequate oral intake related to inability to eat as evidenced by NPO status; ongoing  GOAL:   Patient will meet greater than or equal to 90% of their needs; not met  MONITOR:   TF tolerance, Skin, Weight trends, Labs, I & O's  REASON FOR ASSESSMENT:   Consult, Ventilator Enteral/tube feeding initiation and management  ASSESSMENT:   Pt with PMH of seizures, previous CVA's, HTN, DM, and known L MCA aneurysm who was admitted with large L SAH from ruptured L MCA aneurysm.  Pt remains on trach collar.  6/7 s/p crani for clipping and EVD placement 6/14 cortrak placed 6/17 trach 6/23 EVD clamped  6/24 PEG placed   Pt with emesis and aspiration event 7/12 and tube feedings were stopped. Pt continues on NPO status. Once able to resume tube feeds as appropriate per MD and team, recommend starting at half rate of 30 ml/hr and increasing by 10 ml every 4 hours to goal rate of 60 ml/hr. Continue Prosource TF protein modular.   Labs and medications reviewed.   Diet Order:   Diet Order    None      EDUCATION NEEDS:   No education needs have been identified at this time  Skin:  Skin Assessment: Skin Integrity Issues: Skin Integrity Issues:: Incisions Incisions: head  Last BM:  7/12  Height:   Ht Readings from Last 1 Encounters:  11/19/19 _0  (1.676 m)    Weight:   Wt Readings from Last 1 Encounters:  12/27/19 70.9 kg    BMI:  Body mass index is 25.23 kg/m.  Estimated Nutritional  Needs:   Kcal:  1800-2000  Protein:  110-125 grams  Fluid:  2 L/day  Corrin Parker, MS, RD, LDN RD pager number/after hours weekend pager number on Amion.

## 2019-12-27 NOTE — Progress Notes (Signed)
  NEUROSURGERY PROGRESS NOTE   Hypoglycemic events continue  EXAM:  BP (!) 152/87   Pulse 67   Temp 98.2 F (36.8 C) (Axillary)   Resp 17   Ht 5\' 6"  (1.676 m)   Wt 70.9 kg   SpO2 100%   BMI 25.23 kg/m   Awake, alert Trach Moves BUE spontaenously Follows simple commands  IMPRESSION/PLAN 69 y.o. female  S/p clipping L MCA aneurysm. Stable. - no new NS issues. - continue care per PCCM

## 2019-12-28 DIAGNOSIS — I609 Nontraumatic subarachnoid hemorrhage, unspecified: Secondary | ICD-10-CM | POA: Diagnosis not present

## 2019-12-28 DIAGNOSIS — I6012 Nontraumatic subarachnoid hemorrhage from left middle cerebral artery: Secondary | ICD-10-CM | POA: Diagnosis not present

## 2019-12-28 LAB — BASIC METABOLIC PANEL
Anion gap: 9 (ref 5–15)
BUN: 37 mg/dL — ABNORMAL HIGH (ref 8–23)
CO2: 24 mmol/L (ref 22–32)
Calcium: 9.2 mg/dL (ref 8.9–10.3)
Chloride: 99 mmol/L (ref 98–111)
Creatinine, Ser: 1.23 mg/dL — ABNORMAL HIGH (ref 0.44–1.00)
GFR calc Af Amer: 51 mL/min — ABNORMAL LOW (ref >60)
GFR calc non Af Amer: 44 mL/min — ABNORMAL LOW (ref >60)
Glucose, Bld: 218 mg/dL — ABNORMAL HIGH (ref 70–99)
Potassium: 4.3 mmol/L (ref 3.5–5.1)
Sodium: 132 mmol/L — ABNORMAL LOW (ref 135–145)

## 2019-12-28 LAB — GLUCOSE, CAPILLARY
Glucose-Capillary: 184 mg/dL — ABNORMAL HIGH (ref 70–99)
Glucose-Capillary: 223 mg/dL — ABNORMAL HIGH (ref 70–99)
Glucose-Capillary: 156 mg/dL — ABNORMAL HIGH (ref 70–99)
Glucose-Capillary: 200 mg/dL — ABNORMAL HIGH (ref 70–99)
Glucose-Capillary: 183 mg/dL — ABNORMAL HIGH (ref 70–99)
Glucose-Capillary: 236 mg/dL — ABNORMAL HIGH (ref 70–99)
Glucose-Capillary: 234 mg/dL — ABNORMAL HIGH (ref 70–99)

## 2019-12-28 LAB — CBC
HCT: 29.5 % — ABNORMAL LOW (ref 36.0–46.0)
Hemoglobin: 9.2 g/dL — ABNORMAL LOW (ref 12.0–15.0)
MCH: 28.8 pg (ref 26.0–34.0)
MCHC: 31.2 g/dL (ref 30.0–36.0)
MCV: 92.2 fL (ref 80.0–100.0)
Platelets: 446 10*3/uL — ABNORMAL HIGH (ref 150–400)
RBC: 3.2 MIL/uL — ABNORMAL LOW (ref 3.87–5.11)
RDW: 13.7 % (ref 11.5–15.5)
WBC: 6.3 10*3/uL (ref 4.0–10.5)
nRBC: 0 % (ref 0.0–0.2)

## 2019-12-28 LAB — SARS CORONAVIRUS 2 (TAT 6-24 HRS): SARS Coronavirus 2: NEGATIVE

## 2019-12-28 MED ORDER — JEVITY 1.2 CAL PO LIQD: 1000.0000 mL | ORAL | 0 refills | Status: AC

## 2019-12-28 MED ORDER — APIXABAN 5 MG PO TABS: 5.0000 mg | ORAL_TABLET | Freq: Two times a day (BID) | ORAL | 2 refills | Status: AC

## 2019-12-28 MED ORDER — PROSOURCE TF PO LIQD: 45.0000 mL | Freq: Three times a day (TID) | ORAL | 0 refills | Status: AC

## 2019-12-28 MED ORDER — LEVETIRACETAM 100 MG/ML PO SOLN: 500.0000 mg | Freq: Two times a day (BID) | ORAL | 12 refills | Status: AC

## 2019-12-28 NOTE — Discharge Summary (Addendum)
  Physician Discharge Summary  Patient ID: Jarelly Rinck Stranahan MRN: [id number] DOB/AGE: Apr 18, 1951 69 y.o.  Admit date: 11/19/2019 Discharge date: 12/29/2019  Admission Diagnoses:  SAH, aneurysmal rupture AKI VDRF, S/p trach  Discharge Diagnoses:  Same Active Problems:   Aneurysmal subarachnoid hemorrhage (HCC)   Acute respiratory failure (HCC)   AKI (acute kidney injury) ([place])   SAH (subarachnoid hemorrhage) ([place])   Status post tracheostomy ([place])   Endotracheal tube present   Discharged Condition: Stable  Hospital Course:  [place] is a 69 y.o. female who presented to ED 11/19/2018 with sudden unresponsiveness, left-sided facial droop, and right upper extremity weakness. CT Head revealed large volume left sided SAH with 11mm midline shift. CTA revealed L MCA aneurysm. Prior to angio, patient decompensated requiring intubation and placement of EVD for hydrocephalus. She underwent diagnostic angiogram and ultimately clipping of L MCA aneurysm. Hospital course complicated by VDRF, requiring trach placement 6/17. EVD  Removed 6/27.  Treatments: Surgery 6/7 EVD >>6/27 6/7 ETT > 6/17 6/7 Left pterional craniotomy for clipping of MCA aneurysm 6/17 Trach   Discharge Exam: Blood pressure (!) 130/57, pulse 91, temperature 99 F (37.2 C), temperature source Axillary, resp. rate 14, height 5\' 6"  (1.676 m), weight 74.2 kg, SpO2 98 %. Awake,  Trach Follows simple commands  Disposition: Discharge disposition: [place] Not Defined        Allergies as of 12/29/2019      Reactions   Penicillins Rash      Medication List    STOP taking these medications   aspirin EC 81 MG tablet   HYDROcodone-acetaminophen 5-325 MG tablet Commonly known as: NORCO/VICODIN   levETIRAcetam 500 MG tablet Commonly known as: KEPPRA Replaced by: levETIRAcetam 100 MG/ML solution   loratadine 10 MG tablet Commonly known as: CLARITIN   meloxicam 7.5 MG tablet Commonly  known as: MOBIC     TAKE these medications   amLODipine 10 MG tablet Commonly known as: NORVASC Take 10 mg by mouth daily.   apixaban 5 MG Tabs tablet Commonly known as: ELIQUIS Place 1 tablet (5 mg total) into feeding tube 2 (two) times daily.   feeding supplement (JEVITY 1.2 CAL) Liqd Place 1,000 mLs into feeding tube continuous.   feeding supplement (PROSource TF) liquid Place 45 mLs into feeding tube 3 (three) times daily.   hydrALAZINE 50 MG tablet Commonly known as: APRESOLINE Take 50 mg by mouth 3 (three) times daily.   levETIRAcetam 100 MG/ML solution Commonly known as: KEPPRA Place 5 mLs (500 mg total) into feeding tube 2 (two) times daily. Replaces: levETIRAcetam 500 MG tablet   Linzess 145 MCG Caps capsule Generic drug: linaclotide Take 145 mcg by mouth daily before breakfast.   omeprazole 20 MG capsule Commonly known as: PRILOSEC Take 20 mg by mouth daily.       Contact information for after-discharge care    Destination    [facility] .   Service: Skilled Nursing Contact information: [address] [place] [phone]                  Signed: Traci Sermon 12/29/2019, 11:22 AM

## 2019-12-28 NOTE — Progress Notes (Signed)
  Speech Language Pathology Treatment: [name] Speaking valve  Patient Details Name: Rebecca Green MRN: [id number] DOB: 1950/11/30 Today's Date: 12/28/2019 Time: 1014-1030 SLP Time Calculation (min) (ACUTE ONLY): 16 min  Assessment / Plan / Recommendation Clinical Impression  Pt wore the PMV for 10 minutes. SLP provided a variety of opportunities for communication. Pt responded with head nods to yes/no questions but did not verbalize a response despite cues. Spontaneous communication was minimal but consisted of a few phrases that were mostly unintelligible. Otherwise, she made several vocalizations. Pt consumed a single ice chip with no overt s/s of aspiration but declined other POs offered. Will continue to follow and progress as able.   HPI HPI: Pt is a 69 yo female presenting wiht AMS, L facial droop, and RUE weakness. CT head revealed large volume left sided SAH with 54mm midline shift s/p aneurysm clipping and EVD placement 6/7 (EVD d/c 6/27). She was intubated 6/7; trach placed 6/17. PEG 6/24. PMH includes: CVA (no residual deficits), seizures, HTN, DM II, L MCA aneurysm      SLP Plan  Continue with current plan of care       Recommendations  Diet recommendations: NPO Medication Administration: Via alternative means      Patient may use Passy-Muir Speech Valve: with SLP only         Oral Care Recommendations: Oral care QID Follow up Recommendations: Skilled Nursing facility SLP Visit Diagnosis: Aphonia (R49.1) Plan: Continue with current plan of care       GO                Osie Bond., M.A. [place] Acute Rehabilitation Services Pager [phone] Office [phone]  12/28/2019, 10:51 AM

## 2019-12-28 NOTE — Progress Notes (Signed)
  NEUROSURGERY PROGRESS NOTE   No issues overnight. [facility] has bed available today for d/d  EXAM:  BP (!) 142/61 (BP Location: Left Leg)   Pulse 72   Temp 97.9 F (36.6 C) (Axillary)   Resp 18   Ht 5\' 6"  (1.676 m)   Wt 72.4 kg   SpO2 93%   BMI 25.76 kg/m   Awake, alert Trach Follows simple commands   IMPRESSION/PLAN 69 y.o. female s/p clipping L MCA aneurysm. Stable - D/C today to [employer]

## 2019-12-28 NOTE — TOC Progression Note (Signed)
Transition of Care [facility]) - Progression Note    Patient Details  Name: Natane Heward Roig MRN: [id number] Date of Birth: December 07, 1950  [facility]) CM/SW Contact  Servando Snare, [place] Phone Number: 12/28/2019, 3:17 PM  Clinical Narrative:   Patient can admit in the morning.    Carolin Coy [place] CSW [phone]     Expected Discharge Plan: [place] Barriers to Discharge: Continued Medical Work up  Expected Discharge Plan and Services Expected Discharge Plan: [place] In-house Referral: Clinical Social Work Discharge Planning Services: CM Consult Post Acute Care Choice: Long Term Acute Care (LTAC) Living arrangements for the past 2 months: Single Family Home Expected Discharge Date: 12/28/19                                     Social Determinants of Health (SDOH) Interventions    Readmission Risk Interventions No flowsheet data found.

## 2019-12-28 NOTE — TOC Progression Note (Signed)
Transition of Care [facility]) - Progression Note    Patient Details  Name: Rebecca Green MRN: [id number] Date of Birth: June 09, 1951  Transition of Care [facility]) CM/SW Contact  Rebecca Green, [place] Phone Number: 12/28/2019, 12:25 PM  Clinical Narrative:   Patient has a bed at [facility] and facility can accept today. Facility is verifying that they have trach and feeding supplies for patient. Patient pending Covid test.   [place]     Expected Discharge Plan: [place] Barriers to Discharge: Continued Medical Work up  Expected Discharge Plan and Services Expected Discharge Plan: [place] In-house Referral: Clinical Social Work Discharge Planning Services: CM Consult Post Acute Care Choice: Long Term Acute Care (LTAC) Living arrangements for the past 2 months: Single Family Home Expected Discharge Date: 12/28/19                                     Social Determinants of Health (SDOH) Interventions    Readmission Risk Interventions No flowsheet data found.

## 2019-12-28 NOTE — Progress Notes (Signed)
Physical Therapy Treatment Patient Details Name: Rebecca Green MRN: [id number] DOB: March 24, 1951 Today's Date: 12/28/2019    History of Present Illness Pt is a 69 yo female with ho of HTN, CVAs and known L MCA aneurysm that became unresponsive on 6/6.  Pt now with L SAH with midline shift.  Pt had aneurysm clipped and ventriculostomy placed. Intubated 11/20/19 and trach placed on 6/17.  s/p G tube 12/07/19.    PT Comments    Pt tolerates treatment well, inconsistently following verbal commands and more consistently following commands with tactile cues or demonstration. Pt continues to require significant physical assistance to mobilize, with maxA for bed mobility and transfers at this time. Pt with significant impairments in standing balance with a preference for a posterior lean which places the pt at a high falls risk. Pt will benefit from continued acute PT services to improve mobility quality in an effort to reduce caregiver burden. PT continues to recommend SNF placement at the time of discharge.  Follow Up Recommendations  SNF     Equipment Recommendations  Wheelchair (measurements PT);Wheelchair cushion (measurements PT);Hospital bed;Other (comment)    Recommendations for Other Services       Precautions / Restrictions Precautions Precautions: Fall Precaution Comments: trach collar, PEG, bilateral mits Restrictions Weight Bearing Restrictions: No    Mobility  Bed Mobility Overal bed mobility: Needs Assistance Bed Mobility: Supine to Sit;Sit to Supine     Supine to sit: Max assist Sit to supine: Max assist      Transfers Overall transfer level: Needs assistance Equipment used: 1 person hand held assist Transfers: Sit to/from Stand Sit to Stand: Max assist         General transfer comment: pt performs 3 sit to stand transfers with maxA of PT, pt does initiate final stand attempt and follows commands to lean forward in standing to reduce knee hyperextension and locking  against bedrail  Ambulation/Gait                 Stairs             Wheelchair Mobility    Modified Rankin (Stroke Patients Only) Modified Rankin (Stroke Patients Only) Pre-Morbid Rankin Score: No significant disability Modified Rankin: Severe disability     Balance Overall balance assessment: Needs assistance Sitting-balance support: No upper extremity supported;Feet supported Sitting balance-Leahy Scale: Poor Sitting balance - Comments: minG-minA at edge of bed to maintain static sitting balance   Standing balance support: Bilateral upper extremity supported Standing balance-Leahy Scale: Poor Standing balance comment: modA to maintain static standing balance, posterior lean                            Cognition Arousal/Alertness: Awake/alert Behavior During Therapy: Restless;Impulsive Overall Cognitive Status: Difficult to assess Area of Impairment: Attention;Following commands;Safety/judgement;Awareness                   Current Attention Level: Focused Memory: Decreased recall of precautions Following Commands: Follows one step commands inconsistently Safety/Judgement: Decreased awareness of safety;Decreased awareness of deficits   Problem Solving: Slow processing        Exercises      General Comments General comments (skin integrity, edema, etc.): pt on 5L 28% FiO2 trach collar, VSS      Pertinent Vitals/Pain Pain Assessment: Faces Faces Pain Scale: No hurt    Home Living  Prior Function            PT Goals (current goals can now be found in the care plan section) Acute Rehab PT Goals Patient Stated Goal: unable to state Progress towards PT goals: Progressing toward goals    Frequency    Min 2X/week      PT Plan Current plan remains appropriate    Co-evaluation              AM-PAC PT "6 Clicks" Mobility   Outcome Measure  Help needed turning from your back to your  side while in a flat bed without using bedrails?: A Lot Help needed moving from lying on your back to sitting on the side of a flat bed without using bedrails?: Total Help needed moving to and from a bed to a chair (including a wheelchair)?: Total Help needed standing up from a chair using your arms (e.g., wheelchair or bedside chair)?: Total Help needed to walk in hospital room?: Total Help needed climbing 3-5 steps with a railing? : Total 6 Click Score: 7    End of Session Equipment Utilized During Treatment: Oxygen Activity Tolerance: Patient tolerated treatment well Patient left: in bed;with call bell/phone within reach;with bed alarm set;with nursing/sitter in room Nurse Communication: Mobility status;Need for lift equipment PT Visit Diagnosis: Unsteadiness on feet (R26.81);Muscle weakness (generalized) (M62.81);Difficulty in walking, not elsewhere classified (R26.2) Hemiplegia - Right/Left: Right Hemiplegia - caused by: Nontraumatic SAH     Time: 1550-1605 PT Time Calculation (min) (ACUTE ONLY): 15 min  Charges:  $Therapeutic Activity: 8-22 mins                     Zenaida Niece, PT, DPT Acute Rehabilitation Pager: [phone]    Zenaida Niece 12/28/2019, 4:38 PM

## 2019-12-29 DIAGNOSIS — I609 Nontraumatic subarachnoid hemorrhage, unspecified: Secondary | ICD-10-CM | POA: Diagnosis not present

## 2019-12-29 DIAGNOSIS — I6012 Nontraumatic subarachnoid hemorrhage from left middle cerebral artery: Secondary | ICD-10-CM | POA: Diagnosis not present

## 2019-12-29 LAB — GLUCOSE, CAPILLARY
Glucose-Capillary: 266 mg/dL — ABNORMAL HIGH (ref 70–99)
Glucose-Capillary: 291 mg/dL — ABNORMAL HIGH (ref 70–99)
Glucose-Capillary: 237 mg/dL — ABNORMAL HIGH (ref 70–99)

## 2019-12-29 NOTE — Progress Notes (Addendum)
Report called to [facility], nurse Estill Bamberg took report at this time.  (unable to call report to [facility] before PT[facility] arrived to take pt to the facility).

## 2019-12-29 NOTE — Plan of Care (Signed)
Plan of care, will continue at SNF. Pt not able to comprehend education. Family not present on day of discharge to facility for this nurse to do any further education if needed.  Continued care will be at the SNF.

## 2019-12-29 NOTE — Progress Notes (Signed)
[facility] here to pick pt up to transport to [facility].   Pt PEG tube flushed, IV removed, cleaned of BM, and PTAR EMT placed on O2 connected to trach collar.   At 1315, Pt had to be suctioned via trach before leaving, pt coughed some of the secretions out thru trach and suctioned of a small amount.    Pt d/c'd via stretcher with PTAR, with O2 connected to her trach collar.  Any belongings in the room (hospital supplies...trach replacement and inner cannulas, suction kits and cleaning kits, and all other supplies in pt's room.    Pt left at 1321.

## 2019-12-29 NOTE — TOC Progression Note (Addendum)
Transition of Care [facility]) - Progression Note    Patient Details  Name: Rebecca Green MRN: [id number] Date of Birth: April 17, 1951  Transition of Care [facility]) CM/SW [place], [place] Phone Number: 12/29/2019, 10:10 AM  Clinical Narrative:    CSW spoke with Ch[facility]ell of [facility] and confirmed patient's discharge today.   CSW spoke with patient's daughter Audelia Acton to provide an update and inquire on completing paperwork with SNF, confirmed she is able to complete SNF paperwork after noon. CSW continuing to follow and assist with discharge planning.   Expected Discharge Plan: Skilled Nursing Facility Barriers to Discharge: Continued Medical Work up  Expected Discharge Plan and Services Expected Discharge Plan: [place] In-house Referral: Clinical Social Work Discharge Planning Services: CM Consult Post Acute Care Choice: Long Term Acute Care (LTAC) Living arrangements for the past 2 months: Single Family Home Expected Discharge Date: 12/28/19                                     Social Determinants of Health (SDOH) Interventions    Readmission Risk Interventions No flowsheet data found.

## 2019-12-29 NOTE — TOC Transition Note (Signed)
Transition of Care [facility]) - CM/SW Discharge Note   Patient Details  Name: Rebecca Green MRN: [id number] Date of Birth: 1951/03/19  Transition of Care [facility]) CM/SW Contact:  Jacquelynn Cree Phone Number: 12/29/2019, 12:08 PM   Clinical Narrative:    Patient will DC to: Rebecca Green  Family notified: Rebecca Green Transport by: Corey Harold  RN, patient, patient's family, and facility notified of DC. Discharge Summary and FL2 sent to facility. RN to call report prior to discharge [phone]). DC packet on chart. Ambulance transport requested for patient.   CSW will sign off for now as social work intervention is no longer needed. Please consult [place] again if new needs arise.    Final next level of care: Skilled Nursing Facility Barriers to Discharge: No Barriers Identified   Patient Goals and CMS Choice   CMS Medicare.gov Compare Post Acute Care list provided to:: Patient Represenative (must comment) Rebecca Green) Choice offered to / list presented to : Adult Children  Discharge Placement              Patient [facility] bed at: [facility] Patient to be transferred to facility by: [place] Name of family member notified: Margo Patient and family notified of of transfer: 12/29/19  Discharge Plan and Services In-house Referral: Clinical Social Work Discharge Planning Services: CM Consult Post Acute Care Choice: Long Term Acute Care (LTAC)                               Social Determinants of Health (SDOH) Interventions     Readmission Risk Interventions No flowsheet data found.

## 2020-01-01 ENCOUNTER — Other Ambulatory Visit: Payer: Self-pay

## 2020-01-01 ENCOUNTER — Emergency Department (EMERGENCY_DEPARTMENT_HOSPITAL)
Admission: EM | Admit: 2020-01-01 | Discharge: 2020-01-01 | Disposition: A | Discharge status: Home / Self Care | Payer: Medicare Other | Source: Home / Self Care | Attending: Emergency Medicine | Admitting: Emergency Medicine

## 2020-01-01 ENCOUNTER — Emergency Department (HOSPITAL_COMMUNITY): Payer: Medicare Other

## 2020-01-01 ENCOUNTER — Encounter (HOSPITAL_COMMUNITY): Payer: Self-pay

## 2020-01-01 DIAGNOSIS — R569 Unspecified convulsions: Secondary | ICD-10-CM | POA: Diagnosis present

## 2020-01-01 DIAGNOSIS — J9621 Acute and chronic respiratory failure with hypoxia: Secondary | ICD-10-CM | POA: Diagnosis not present

## 2020-01-01 DIAGNOSIS — I82612 Acute embolism and thrombosis of superficial veins of left upper extremity: Secondary | ICD-10-CM | POA: Diagnosis not present

## 2020-01-01 DIAGNOSIS — G9608 Other cranial cerebrospinal fluid leak: Secondary | ICD-10-CM | POA: Diagnosis not present

## 2020-01-01 DIAGNOSIS — E1165 Type 2 diabetes mellitus with hyperglycemia: Secondary | ICD-10-CM | POA: Diagnosis not present

## 2020-01-01 DIAGNOSIS — R739 Hyperglycemia, unspecified: Secondary | ICD-10-CM | POA: Insufficient documentation

## 2020-01-01 DIAGNOSIS — R001 Bradycardia, unspecified: Secondary | ICD-10-CM | POA: Diagnosis not present

## 2020-01-01 DIAGNOSIS — G40101 Localization-related (focal) (partial) symptomatic epilepsy and epileptic syndromes with simple partial seizures, not intractable, with status epilepticus: Secondary | ICD-10-CM | POA: Diagnosis not present

## 2020-01-01 DIAGNOSIS — E870 Hyperosmolality and hypernatremia: Secondary | ICD-10-CM | POA: Diagnosis not present

## 2020-01-01 DIAGNOSIS — J9500 Unspecified tracheostomy complication: Secondary | ICD-10-CM

## 2020-01-01 DIAGNOSIS — Z8673 Personal history of transient ischemic attack (TIA), and cerebral infarction without residual deficits: Secondary | ICD-10-CM | POA: Diagnosis not present

## 2020-01-01 DIAGNOSIS — Z9911 Dependence on respirator [ventilator] status: Secondary | ICD-10-CM | POA: Diagnosis not present

## 2020-01-01 DIAGNOSIS — N179 Acute kidney failure, unspecified: Secondary | ICD-10-CM | POA: Diagnosis not present

## 2020-01-01 DIAGNOSIS — Z79899 Other long term (current) drug therapy: Secondary | ICD-10-CM | POA: Insufficient documentation

## 2020-01-01 DIAGNOSIS — I472 Ventricular tachycardia: Secondary | ICD-10-CM | POA: Diagnosis not present

## 2020-01-01 DIAGNOSIS — R6521 Severe sepsis with septic shock: Secondary | ICD-10-CM | POA: Diagnosis not present

## 2020-01-01 DIAGNOSIS — Z7984 Long term (current) use of oral hypoglycemic drugs: Secondary | ICD-10-CM | POA: Insufficient documentation

## 2020-01-01 DIAGNOSIS — E872 Acidosis: Secondary | ICD-10-CM | POA: Diagnosis not present

## 2020-01-01 DIAGNOSIS — D649 Anemia, unspecified: Secondary | ICD-10-CM | POA: Diagnosis not present

## 2020-01-01 DIAGNOSIS — Z20822 Contact with and (suspected) exposure to covid-19: Secondary | ICD-10-CM | POA: Diagnosis not present

## 2020-01-01 DIAGNOSIS — Z930 Tracheostomy status: Secondary | ICD-10-CM | POA: Diagnosis not present

## 2020-01-01 DIAGNOSIS — I82622 Acute embolism and thrombosis of deep veins of left upper extremity: Secondary | ICD-10-CM | POA: Diagnosis not present

## 2020-01-01 DIAGNOSIS — I10 Essential (primary) hypertension: Secondary | ICD-10-CM | POA: Diagnosis not present

## 2020-01-01 DIAGNOSIS — Z8679 Personal history of other diseases of the circulatory system: Secondary | ICD-10-CM | POA: Diagnosis not present

## 2020-01-01 DIAGNOSIS — A419 Sepsis, unspecified organism: Secondary | ICD-10-CM | POA: Diagnosis not present

## 2020-01-01 DIAGNOSIS — I4891 Unspecified atrial fibrillation: Secondary | ICD-10-CM | POA: Diagnosis not present

## 2020-01-01 DIAGNOSIS — E781 Pure hyperglyceridemia: Secondary | ICD-10-CM | POA: Diagnosis not present

## 2020-01-01 DIAGNOSIS — J9503 Malfunction of tracheostomy stoma: Secondary | ICD-10-CM | POA: Diagnosis not present

## 2020-01-01 DIAGNOSIS — I469 Cardiac arrest, cause unspecified: Secondary | ICD-10-CM | POA: Diagnosis not present

## 2020-01-01 DIAGNOSIS — Z86718 Personal history of other venous thrombosis and embolism: Secondary | ICD-10-CM | POA: Diagnosis not present

## 2020-01-01 LAB — BASIC METABOLIC PANEL
Anion gap: 9 (ref 5–15)
BUN: 44 mg/dL — ABNORMAL HIGH (ref 8–23)
CO2: 25 mmol/L (ref 22–32)
Calcium: 9.9 mg/dL (ref 8.9–10.3)
Chloride: 106 mmol/L (ref 98–111)
Creatinine, Ser: 1.37 mg/dL — ABNORMAL HIGH (ref 0.44–1.00)
GFR calc Af Amer: 45 mL/min — ABNORMAL LOW (ref >60)
GFR calc non Af Amer: 39 mL/min — ABNORMAL LOW (ref >60)
Glucose, Bld: 381 mg/dL — ABNORMAL HIGH (ref 70–99)
Potassium: 5.6 mmol/L — ABNORMAL HIGH (ref 3.5–5.1)
Sodium: 140 mmol/L (ref 135–145)

## 2020-01-01 LAB — CBC WITH DIFFERENTIAL/PLATELET
Abs Immature Granulocytes: 0.11 10*3/uL — ABNORMAL HIGH (ref 0.00–0.07)
Basophils Absolute: 0.1 10*3/uL (ref 0.0–0.1)
Basophils Relative: 1 %
Eosinophils Absolute: 0.1 10*3/uL (ref 0.0–0.5)
Eosinophils Relative: 1 %
HCT: 32.9 % — ABNORMAL LOW (ref 36.0–46.0)
Hemoglobin: 9.9 g/dL — ABNORMAL LOW (ref 12.0–15.0)
Immature Granulocytes: 1 %
Lymphocytes Relative: 12 %
Lymphs Abs: 1.2 10*3/uL (ref 0.7–4.0)
MCH: 29 pg (ref 26.0–34.0)
MCHC: 30.1 g/dL (ref 30.0–36.0)
MCV: 96.5 fL (ref 80.0–100.0)
Monocytes Absolute: 0.9 10*3/uL (ref 0.1–1.0)
Monocytes Relative: 9 %
Neutro Abs: 7.4 10*3/uL (ref 1.7–7.7)
Neutrophils Relative %: 76 %
Platelets: 459 10*3/uL — ABNORMAL HIGH (ref 150–400)
RBC: 3.41 MIL/uL — ABNORMAL LOW (ref 3.87–5.11)
RDW: 13.9 % (ref 11.5–15.5)
WBC: 9.8 10*3/uL (ref 4.0–10.5)
nRBC: 0.2 % (ref 0.0–0.2)

## 2020-01-01 MED ORDER — METFORMIN HCL 500 MG PO TABS: 500.0000 mg | ORAL_TABLET | Freq: Two times a day (BID) | ORAL | 0 refills | Status: AC

## 2020-01-01 NOTE — Progress Notes (Signed)
RT note: Patient trach was out unknown time at [facility]. RT attempted to place a #4 cuffless shiley in patients stoma. Stoma had already closed to much to reinsert trach. RT covered stoma and placed patient on 2L nasal canula.Vital signs were stable through out procedure.MD and RN aware.

## 2020-01-01 NOTE — ED Triage Notes (Signed)
RT's at bedside when pt arrived and immediately began to assess and attempt to replace trach. Pt not agitated at this time.

## 2020-01-01 NOTE — ED Notes (Signed)
Got patient on the monitor patient is resting with call bell in reach  ?

## 2020-01-01 NOTE — ED Notes (Signed)
Updated EDP on pt and her condition.

## 2020-01-01 NOTE — Discharge Instructions (Addendum)
Rebecca Green was evaluated by the pulmonary team.  She does not require a tracheostomy any longer.  The wound should close up within about a week.  If it stays often consider an evaluation by an ENT doctor to arrange for wound closure.   Incidentally her blood sugar was noticed to be high today.  Follow up with your doctor to check on your blood sugar.  Start taking the metformin as prescribed.

## 2020-01-01 NOTE — ED Provider Notes (Signed)
[facility] EMERGENCY DEPARTMENT Provider Note   CSN: [id number] Arrival date & time: 01/01/20  7616     History Chief Complaint  Patient presents with  . Trach removed by patient    Rebecca Green is a 69 y.o. female.  HPI   Patient presented to the ED for evaluation of a displaced tracheostomy.  According to the medical records the patient was admitted to the hospital on June 6 for a subarachnoid hemorrhage..  During that hospitalization the patient had a tracheostomy placed on June 17 and a left craniotomy with aneurysm clipping on June 7.  She was discharged from the facility on July 16 to a nursing facility.  Patient presents to the ED today because at some point during the night patient removed her tracheostomy.  She is currently breathing without difficulty.  Patient does not appear to be in any distress.  History is somewhat limited however because of the patient's neurologic deficits associated with her subarachnoid hemorrhage.  Patient is also not speak because of her tracheostomy.  Patient does smile at me with when I speak to her  Past Medical History:  Diagnosis Date  . CVA (cerebral vascular accident) ([place])   . HTN (hypertension)   . Seizure [facility])     Patient Active Problem List   Diagnosis Date Noted  . Endotracheal tube present   . AKI (acute kidney injury) ([place])   . SAH (subarachnoid hemorrhage) ([place])   . Status post tracheostomy ([place])   . Acute respiratory failure ([place])   . 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 CRANIOTOMY Left 11/20/2019   Procedure: LEFT CRANIOTOMY INTRACRANIAL ANEURYSM CLIPPING;  Surgeon: [name], MD;  Location: [place];  Service: Neurosurgery;  Laterality: Left;  . IR 3D INDEPENDENT WKST  11/20/2019  . IR ANGIO INTRA EXTRACRAN SEL INTERNAL CAROTID BILAT MOD SED  11/20/2019  . IR ANGIO VERTEBRAL SEL VERTEBRAL UNI L MOD SED  11/20/2019  . IR  GASTROSTOMY TUBE MOD SED  12/07/2019  . IR US GUIDE VASC ACCESS RIGHT  11/20/2019  . RADIOLOGY WITH ANESTHESIA N/A 11/20/2019   Procedure: IR WITH ANESTHESIA;  Surgeon: [name], MD;  Location: [place];  Service: Radiology;  Laterality: N/A;     OB History   No obstetric history on file.     History reviewed. No pertinent family history.  Social History   Tobacco Use  . Smoking status: Unknown If Ever Smoked  Substance Use Topics  . Alcohol use: Not on file  . Drug use: Not on file    Home Medications Prior to Admission medications   Medication Sig Start Date End Date Taking? Authorizing Provider  amLODipine (NORVASC) 10 MG tablet Take 10 mg by mouth daily. 09/25/19  Yes [provider]  apixaban (ELIQUIS) 5 MG TABS tablet Place 1 tablet (5 mg total) into feeding tube 2 (two) times daily. 12/28/19  Yes Costella, Vista Mink, PA-C  hydrALAZINE (APRESOLINE) 50 MG tablet Take 50 mg by mouth 3 (three) times daily. 10/30/19  Yes [provider]  levETIRAcetam (KEPPRA) 100 MG/ML solution Place 5 mLs (500 mg total) into feeding tube 2 (two) times daily. 12/28/19  Yes Costella, Vista Mink, PA-C  linaclotide (LINZESS) 145 MCG CAPS capsule Take 145 mcg by mouth daily before breakfast.   Yes [provider]  Nutritional Supplements (FEEDING SUPPLEMENT, JEVITY 1.2 CAL,) LIQD Place 1,000 mLs into feeding tube continuous. 12/28/19  Yes Costella, Vista Mink, PA-C  Nutritional Supplements (FEEDING SUPPLEMENT, PROSOURCE TF,) liquid Place 45 mLs into feeding tube 3 (three) times daily. 12/28/19  Yes Costella, Vista Mink, PA-C  omeprazole (PRILOSEC) 20 MG capsule Take 20 mg by mouth daily. 10/30/19  Yes [provider]  ondansetron (ZOFRAN) 4 MG tablet Take 4 mg by mouth every 8 (eight) hours as needed for nausea or vomiting.   Yes [provider]  metFORMIN (GLUCOPHAGE) 500 MG tablet Take 1 tablet (500 mg total) by mouth 2 (two) times daily. 01/01/20   Dorie Rank,  MD    Allergies    Penicillins  Review of Systems   Review of Systems  All other systems reviewed and are negative.   Physical Exam Updated Vital Signs BP (!) 158/142   Temp 98.2 F (36.8 C) (Axillary)   Resp 17   Ht 1.676 m (5\' 6" )   Wt 74.2 kg   SpO2 100%   BMI 26.40 kg/m   Physical Exam Vitals and nursing note reviewed.  Constitutional:      General: She is not in acute distress.    Appearance: She is well-developed.  HENT:     Head: Normocephalic and atraumatic.     Right Ear: External ear normal.     Left Ear: External ear normal.  Eyes:     General: No scleral icterus.       Right eye: No discharge.        Left eye: No discharge.     Conjunctiva/sclera: Conjunctivae normal.  Neck:     Trachea: No tracheal deviation.     Comments: Tracheostomy without drainage or bleeding Cardiovascular:     Rate and Rhythm: Normal rate and regular rhythm.  Pulmonary:     Effort: Pulmonary effort is normal. No respiratory distress.     Breath sounds: Normal breath sounds. No stridor. No wheezing or rales.  Abdominal:     General: Bowel sounds are normal. There is no distension.     Palpations: Abdomen is soft.     Tenderness: There is no abdominal tenderness. There is no guarding or rebound.  Musculoskeletal:        General: No tenderness.     Cervical back: Neck supple.  Skin:    General: Skin is warm and dry.     Findings: No rash.  Neurological:     Mental Status: She is alert.     Cranial Nerves: No cranial nerve deficit (no facial droop, extraocular movements intact,  ).     Sensory: No sensory deficit.     Motor: No abnormal muscle tone or seizure activity.     Coordination: Coordination normal.     Comments: Patient smiles when I speak to her.  She does not follow commands     ED Results / Procedures / Treatments   Labs (all labs ordered are listed, but only abnormal results are displayed) Labs Reviewed  CBC WITH DIFFERENTIAL/PLATELET - Abnormal;  Notable for the following components:      Result Value   RBC 3.41 (*)    Hemoglobin 9.9 (*)    HCT 32.9 (*)    Platelets 459 (*)    Abs Immature Granulocytes 0.11 (*)    All other components within normal limits  BASIC METABOLIC PANEL - Abnormal; Notable for the following components:   Potassium 5.6 (*)    Glucose, Bld 381 (*)    BUN 44 (*)    Creatinine, Ser 1.37 (*)  GFR calc non Af Amer 39 (*)    GFR calc Af Amer 45 (*)    All other components within normal limits    EKG None  Radiology DG Chest Portable 1 View  Result Date: 01/01/2020 CLINICAL DATA:  Extubation EXAM: PORTABLE CHEST 1 VIEW COMPARISON:  12/14/2019 FINDINGS: Tracheostomy tube has been removed. Stable cardiomediastinal contours. Atherosclerotic calcification of the aortic knob. Chronically increased interstitial markings bilaterally. No new focal airspace consolidation. No pleural effusion or pneumothorax. IMPRESSION: No acute cardiopulmonary findings. Chronically increased interstitial markings bilaterally. Electronically Signed   By: Davina Poke D.O.   On: 01/01/2020 10:06    Procedures Procedures (including critical care time)  Medications Ordered in ED Medications - No data to display  ED Course  I have reviewed the triage vital signs and the nursing notes.  Pertinent labs & imaging results that were available during my care of the patient were reviewed by me and considered in my medical decision making (see chart for details).  Clinical Course as of Dec 31 1237  Mon Jan 01, 2020  1128 Patient was assessed by the pulmonary critical care service. Patient is breathing easily. Her tracheostomy was previously placed because she was intubated associated with her neurosurgical event. Patient did not have a primary respiratory issue. At this time she is breathing easily. She does not require supplemental oxygen. Oxygen saturation is 100%. No indication for reinsertion of tracheostomy per PCCM   [JK]    1130 Labs reviewed. Hyperglycemia noted. This was not noted on previous labs. We will start the patient on Metformin. Recommend follow-up with her primary doctor   [JK]  1131 Hemoglobin reviewed. Anemia stable   [JK]  69 Spoke with patient's daughter and updated her on patient's status   [JK]    Clinical Course User Index [JK] Dorie Rank, MD   MDM Rules/Calculators/A&P                          According to the EMS report staff facility called 911 when he noted the patient's tracheostomy was removed.  When they arrived she was 70% on room air.  In the ED patient is currently breathing comfortably.  Her oxygen saturation is 100%.  She is on nasal cannula oxygen.  Respiratory therapy attempted to replace her tracheostomy without success.  Currently she is stable.  Does not require any emergent airway intervention.  I will consult pulmonary critical care who did place her initial tracheostomy.  Patient was evaluated by critical care.  They do not feel that she requires a tracheostomy any longer.  Patient was monitored in the ED.  She was not having any respiratory difficulty.  She will be discharged back to the nursing facility.  Patient was noted to be hyperglycemic.  I was notified by the diabetes coordinator the patient had been getting a sliding scale when she was in the hospital.  She was not discharged on diabetes medications and they recommended outpatient follow-up.  I will start the patient on course of Metformin.  Recommend follow-up with the nursing home doctor. Final Clinical Impression(s) / ED Diagnoses Final diagnoses:  Hyperglycemia  Tracheostomy complication, unspecified complication type ([place])    Rx / DC Orders ED Discharge Orders         Ordered    metFORMIN (GLUCOPHAGE) 500 MG tablet  2 times daily     Discontinue  Reprint     01/01/20 1130  Dorie Rank, MD 01/01/20 1240

## 2020-01-01 NOTE — Consult Note (Signed)
NAME:  Rebecca Green, MRN:  [id number], DOB:  1951/02/13, LOS: 0 ADMISSION DATE:  01/01/2020, CONSULTATION DATE:  01/01/2020 REFERRING MD:  Dr. Hillard Danker, CHIEF COMPLAINT:  Trach displacement  History of present illness   HPI obtained from medical chart review and per ED staff as patient is mostly non-verbal and unable to provide.   69 year old female with prior history of CVA, HTN, seizures, and DMT2 who was recently admitted 6/6 to 7/16 after being admitted with acute left sided SAH s/p aneurysm clipping on 6/7.  Hospitalization complicated by ventilator dependent respiratory failure and intubated on 6/7.  Tracheostomy placed  6/17 by Dr. Valeta Harms.  PEG placed 6/24 by IR.  Required intermittent MV support but fully transitioned to trach collar since 6/28.  Lurline Idol remained in place given ongoing concerns for secretions management.  Last trach change on 7/12 with a #6 Shiley uncuffed XLT distal.  She was discharged to [facility] on 7/16.  She presented back to the ER on 7/19 after staff reported she had removed her trach at somepoint during the night.  Found more agitated and with room air saturations at 70%.  She was placed on NRB and transported to ER by EMS.  In ER, she is afebrile and hemodynamically stable.  Attempts to reinsert her trach with a smaller 4 uncuffed Shiley unsuccessful given some closure of her stoma already.  Given she has not exhibited any respiratory distress in ER, her stoma was covered, and she remains on 2L [place] with saturations > 98%.  PCCM called for further evaluation and recommendations for reinserting her trach.   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 Chronically increased interstitial markings  bilaterally.  Micro Data:  none  Antimicrobials:  none  Interim history/subjective:  none  Objective   Blood pressure (!) 143/78, temperature 98.2 F (36.8 C), temperature source Axillary, resp. rate 16, height 5\' 6"  (1.676 m), weight 74.2 kg, SpO2 100 %.       No intake or output data in the 24 hours ending 01/01/20 1049 Filed Weights   01/01/20 0951  Weight: 74.2 kg    Examination: General:  Chronically ill elderly female lying in bed in NAD, occasionally restless at times HEENT: pupils/ 4/ reactive, will not open mouth for visualization, stoma site is without drainage or erythema, almost closed, no oral secretions Neuro: tracks, attempts to follow simple commands- better so on left side, MAE but left appears better, only attempt at verbalization was to say she was "cold"/ good phonation CV: rr, no murmur PULM:  Non labored, CTA GI: soft, bs active, NT, PEG - clamped Extremities: cool/dry, no LE edema  Skin: no rashes, posterior not examined  Resolved Hospital Problem list    Assessment & Plan:   Prior chronic respiratory failure with trach placement 11/30/2019 - currently patient is in no respiratory distress, has no secretions, and saturations are > 98% on 2L in my presence and currently weaning [place].  Her mental status appears stable from her baseline at discharge.   At this time, there is no indication to replace tracheostomy.  Continue to cover stoma until healed.  She continues to be NPO and all meds/ nutrition via PEG with SLP  following at SNF - if she were to decompensate in the interim, she would need re-intubation as her stoma would likely have to be dilated prior to trach insertion and she is on eliquis which should be held prior to.    Plan is to monitor her for several more hours in ER and if stable, can return to SNF.  Labs    CBC: Recent Labs  Lab 12/28/19 0650  WBC 6.3  HGB 9.2*  HCT 29.5*  MCV 92.2  PLT 446*    Basic Metabolic Panel: Recent Labs   Lab 12/25/19 1435 12/27/19 0338 12/28/19 0650  NA 133* 136 132*  K 5.4* 4.2 4.3  CL 99 102 99  CO2 23 22 24   GLUCOSE 176* 79 218*  BUN 30* 34* 37*  CREATININE 1.23* 1.16* 1.23*  CALCIUM 9.6 9.3 9.2   GFR: Estimated Creatinine Clearance: 43.9 mL/min (A) (by C-G formula based on SCr of 1.23 mg/dL (H)). Recent Labs  Lab 12/28/19 0650  WBC 6.3    Liver Function Tests: No results for input(s): AST, ALT, ALKPHOS, BILITOT, PROT, ALBUMIN in the last 168 hours. No results for input(s): LIPASE, AMYLASE in the last 168 hours. No results for input(s): AMMONIA in the last 168 hours.  ABG    Component Value Date/Time   PHART 7.442 12/05/2019 0300   PCO2ART 32.3 12/05/2019 0300   PO2ART 109 (H) 12/05/2019 0300   HCO3 21.8 12/05/2019 0300   TCO2 23 12/05/2019 0300   ACIDBASEDEF 2.0 12/05/2019 0300   O2SAT 98.0 12/05/2019 0300     Coagulation Profile: No results for input(s): INR, PROTIME in the last 168 hours.  Cardiac Enzymes: No results for input(s): CKTOTAL, CKMB, CKMBINDEX, TROPONINI in the last 168 hours.  HbA1C: Hgb A1c MFr Bld  Date/Time Value Ref Range Status  11/21/2019 10:55 AM 7.8 (H) 4.8 - 5.6 % Final    Comment:    (NOTE) Pre diabetes:          5.7%-6.4% Diabetes:              >6.4% Glycemic control for   <7.0% adults with diabetes     CBG: Recent Labs  Lab 12/28/19 2209 12/28/19 2313 12/29/19 0350 12/29/19 0747 12/29/19 1148  GLUCAP 236* 234* 266* 291* 237*    Review of Systems:   Unable   Past Medical History  She,  has a past medical history of CVA (cerebral vascular accident) ([place]), HTN (hypertension), and Seizure ([place]).   Surgical History    Past Surgical History:  Procedure Laterality Date  . CRANIOTOMY Left 11/20/2019   Procedure: LEFT CRANIOTOMY INTRACRANIAL ANEURYSM CLIPPING;  Surgeon: [name], MD;  Location: [place];  Service: Neurosurgery;  Laterality: Left;  . IR 3D INDEPENDENT WKST  11/20/2019  . IR ANGIO INTRA  EXTRACRAN SEL INTERNAL CAROTID BILAT MOD SED  11/20/2019  . IR ANGIO VERTEBRAL SEL VERTEBRAL UNI L MOD SED  11/20/2019  . IR GASTROSTOMY TUBE MOD SED  12/07/2019  . IR US GUIDE VASC ACCESS RIGHT  11/20/2019  . RADIOLOGY WITH ANESTHESIA N/A 11/20/2019   Procedure: IR WITH ANESTHESIA;  Surgeon: [name], MD;  Location: [place];  Service: Radiology;  Laterality: N/A;     Social History  Unable  Family History   Her family history is not on file.   Allergies Allergies  Allergen Reactions  . Penicillins Rash     Home Medications  Prior to Admission medications   Medication Sig Start Date End Date  Taking? Authorizing Provider  amLODipine (NORVASC) 10 MG tablet Take 10 mg by mouth daily. 09/25/19  Yes [provider]  apixaban (ELIQUIS) 5 MG TABS tablet Place 1 tablet (5 mg total) into feeding tube 2 (two) times daily. 12/28/19  Yes Costella, Vista Mink, PA-C  hydrALAZINE (APRESOLINE) 50 MG tablet Take 50 mg by mouth 3 (three) times daily. 10/30/19  Yes [provider]  levETIRAcetam (KEPPRA) 100 MG/ML solution Place 5 mLs (500 mg total) into feeding tube 2 (two) times daily. 12/28/19  Yes Costella, Vista Mink, PA-C  linaclotide (LINZESS) 145 MCG CAPS capsule Take 145 mcg by mouth daily before breakfast.   Yes [provider]  Nutritional Supplements (FEEDING SUPPLEMENT, JEVITY 1.2 CAL,) LIQD Place 1,000 mLs into feeding tube continuous. 12/28/19  Yes Costella, Vista Mink, PA-C  Nutritional Supplements (FEEDING SUPPLEMENT, PROSOURCE TF,) liquid Place 45 mLs into feeding tube 3 (three) times daily. 12/28/19  Yes Costella, Vista Mink, PA-C  omeprazole (PRILOSEC) 20 MG capsule Take 20 mg by mouth daily. 10/30/19  Yes [provider]  ondansetron (ZOFRAN) 4 MG tablet Take 4 mg by mouth every 8 (eight) hours as needed for nausea or vomiting.   Yes [provider]          Kennieth Rad, MSN, AGACNP-BC  Pulmonary & Critical Care 01/01/2020, 10:49  AM  See Shea Evans for personal pager PCCM on call pager [phone]

## 2020-01-01 NOTE — ED Notes (Signed)
[facility] staff nurse verbalizes understanding of discharge instructions. Opportunity for questioning and answers were provided. Armband removed by staff, pt discharged from ED per PTAR.

## 2020-01-01 NOTE — Progress Notes (Signed)
Inpatient Diabetes Program Recommendations  AACE/ADA: New Consensus Statement on Inpatient Glycemic Control   Target Ranges:  Prepandial:   less than 140 mg/dL      Peak postprandial:   less than 180 mg/dL (1-2 hours)      Critically ill patients:  140 - 180 mg/dL   Results for Rebecca Green, Rebecca Green (MRN [id number]) as of 01/01/2020 12:19  Ref. Range 01/01/2020 10:03  Glucose Latest Ref Range: 70 - 99 mg/dL 381 (H)  Results for Rebecca Green, Rebecca Green (MRN [id number]) as of 01/01/2020 12:19  Ref. Range 11/21/2019 10:55  Hemoglobin A1C Latest Ref Range: 4.8 - 5.6 % 7.8 (H)   Review of Glycemic Control  Diabetes history: DM2 Outpatient Diabetes medications: None on home med list Current orders for Inpatient glycemic control: None; in ED  Inpatient Diabetes Program Recommendations:   Insulin-Please consider ordering Lantus 5 units Q24H, CBGs Q4H, and Novolog 0-6 units Q4H while holding in the Emergency Department.  HbgA1C:  A1C 7.8% on 11/21/19 indicating an average glucose of 177 mg/dl. No DM medications listed on home medication list. Anticipate patient will need to have glucose monitoring and medications for DM control as an outpatient.  NOTE: In reviewing chart, noted patient was inpatient 11/19/19-12/29/19 and was ordered insulin during admission. No DM medications noted on discharge summary on 12/29/19. Lab glucose 381 mg/dl today.   Thanks, Barnie Alderman, RN, MSN, CDE Diabetes Coordinator Inpatient Diabetes Program [phone] (Team Pager from 8am to 5pm)

## 2020-01-01 NOTE — ED Notes (Signed)
PTAR called @ 1319-per Ariel, RN called by Levada Dy

## 2020-01-01 NOTE — ED Notes (Signed)
Help get patient cleaned up sheets changed patient is resting with call bell in reach  

## 2020-01-01 NOTE — ED Triage Notes (Signed)
Pt bib by [facility] from [facility]. Staff reported to [facility] that she removed her trach sometime during the night. They called 911. Pt was at 70% on RA and more agitated than normal AMS. Staff also was unaware the pt has DM. [facility] cbg 427. Pt placed and NRB and agitation decreased with SPO2 at 100%. Pt given 500 ml NS. Hr 90's, bp 172/91

## 2020-01-04 ENCOUNTER — Emergency Department (HOSPITAL_COMMUNITY): Payer: Medicare Other

## 2020-01-04 ENCOUNTER — Inpatient Hospital Stay (HOSPITAL_COMMUNITY): Payer: Medicare Other

## 2020-01-04 ENCOUNTER — Inpatient Hospital Stay (HOSPITAL_COMMUNITY)
Admission: EM | Admit: 2020-01-04 | Discharge: 2020-01-14 | DRG: 100 | Disposition: E | Payer: Medicare Other | Attending: Pulmonary Disease | Admitting: Pulmonary Disease

## 2020-01-04 ENCOUNTER — Other Ambulatory Visit: Payer: Self-pay

## 2020-01-04 DIAGNOSIS — I82612 Acute embolism and thrombosis of superficial veins of left upper extremity: Secondary | ICD-10-CM | POA: Diagnosis present

## 2020-01-04 DIAGNOSIS — Z794 Long term (current) use of insulin: Secondary | ICD-10-CM

## 2020-01-04 DIAGNOSIS — R6521 Severe sepsis with septic shock: Secondary | ICD-10-CM | POA: Diagnosis not present

## 2020-01-04 DIAGNOSIS — E872 Acidosis: Secondary | ICD-10-CM | POA: Diagnosis not present

## 2020-01-04 DIAGNOSIS — K567 Ileus, unspecified: Secondary | ICD-10-CM

## 2020-01-04 DIAGNOSIS — A419 Sepsis, unspecified organism: Secondary | ICD-10-CM | POA: Diagnosis present

## 2020-01-04 DIAGNOSIS — E1100 Type 2 diabetes mellitus with hyperosmolarity without nonketotic hyperglycemic-hyperosmolar coma (NKHHC): Secondary | ICD-10-CM

## 2020-01-04 DIAGNOSIS — J9601 Acute respiratory failure with hypoxia: Secondary | ICD-10-CM

## 2020-01-04 DIAGNOSIS — I10 Essential (primary) hypertension: Secondary | ICD-10-CM | POA: Diagnosis present

## 2020-01-04 DIAGNOSIS — E875 Hyperkalemia: Secondary | ICD-10-CM | POA: Diagnosis present

## 2020-01-04 DIAGNOSIS — Z8673 Personal history of transient ischemic attack (TIA), and cerebral infarction without residual deficits: Secondary | ICD-10-CM | POA: Diagnosis not present

## 2020-01-04 DIAGNOSIS — I82622 Acute embolism and thrombosis of deep veins of left upper extremity: Secondary | ICD-10-CM | POA: Diagnosis present

## 2020-01-04 DIAGNOSIS — Z20822 Contact with and (suspected) exposure to covid-19: Secondary | ICD-10-CM | POA: Diagnosis present

## 2020-01-04 DIAGNOSIS — Z880 Allergy status to penicillin: Secondary | ICD-10-CM

## 2020-01-04 DIAGNOSIS — G40101 Localization-related (focal) (partial) symptomatic epilepsy and epileptic syndromes with simple partial seizures, not intractable, with status epilepticus: Secondary | ICD-10-CM | POA: Diagnosis not present

## 2020-01-04 DIAGNOSIS — Z79899 Other long term (current) drug therapy: Secondary | ICD-10-CM

## 2020-01-04 DIAGNOSIS — G40901 Epilepsy, unspecified, not intractable, with status epilepticus: Secondary | ICD-10-CM | POA: Diagnosis present

## 2020-01-04 DIAGNOSIS — Z9911 Dependence on respirator [ventilator] status: Secondary | ICD-10-CM | POA: Diagnosis not present

## 2020-01-04 DIAGNOSIS — I469 Cardiac arrest, cause unspecified: Secondary | ICD-10-CM | POA: Diagnosis not present

## 2020-01-04 DIAGNOSIS — J9503 Malfunction of tracheostomy stoma: Secondary | ICD-10-CM | POA: Diagnosis not present

## 2020-01-04 DIAGNOSIS — I4891 Unspecified atrial fibrillation: Secondary | ICD-10-CM | POA: Diagnosis not present

## 2020-01-04 DIAGNOSIS — Z8679 Personal history of other diseases of the circulatory system: Secondary | ICD-10-CM

## 2020-01-04 DIAGNOSIS — E870 Hyperosmolality and hypernatremia: Secondary | ICD-10-CM | POA: Diagnosis not present

## 2020-01-04 DIAGNOSIS — N179 Acute kidney failure, unspecified: Secondary | ICD-10-CM | POA: Diagnosis not present

## 2020-01-04 DIAGNOSIS — I472 Ventricular tachycardia: Secondary | ICD-10-CM | POA: Diagnosis not present

## 2020-01-04 DIAGNOSIS — J9621 Acute and chronic respiratory failure with hypoxia: Secondary | ICD-10-CM | POA: Diagnosis not present

## 2020-01-04 DIAGNOSIS — G9608 Other cranial cerebrospinal fluid leak: Secondary | ICD-10-CM | POA: Diagnosis present

## 2020-01-04 DIAGNOSIS — R680 Hypothermia, not associated with low environmental temperature: Secondary | ICD-10-CM | POA: Diagnosis present

## 2020-01-04 DIAGNOSIS — D649 Anemia, unspecified: Secondary | ICD-10-CM | POA: Diagnosis not present

## 2020-01-04 DIAGNOSIS — R569 Unspecified convulsions: Secondary | ICD-10-CM | POA: Diagnosis present

## 2020-01-04 DIAGNOSIS — Z7901 Long term (current) use of anticoagulants: Secondary | ICD-10-CM

## 2020-01-04 DIAGNOSIS — E781 Pure hyperglyceridemia: Secondary | ICD-10-CM | POA: Diagnosis not present

## 2020-01-04 DIAGNOSIS — Z452 Encounter for adjustment and management of vascular access device: Secondary | ICD-10-CM

## 2020-01-04 DIAGNOSIS — Z86718 Personal history of other venous thrombosis and embolism: Secondary | ICD-10-CM | POA: Diagnosis not present

## 2020-01-04 DIAGNOSIS — Z931 Gastrostomy status: Secondary | ICD-10-CM

## 2020-01-04 DIAGNOSIS — R001 Bradycardia, unspecified: Secondary | ICD-10-CM | POA: Diagnosis not present

## 2020-01-04 DIAGNOSIS — E1165 Type 2 diabetes mellitus with hyperglycemia: Secondary | ICD-10-CM | POA: Diagnosis not present

## 2020-01-04 LAB — URINALYSIS, ROUTINE W REFLEX MICROSCOPIC
Bacteria, UA: NONE SEEN
Bilirubin Urine: NEGATIVE
Glucose, UA: >=500 mg/dL — AB
Hgb urine dipstick: NEGATIVE
Ketones, ur: NEGATIVE mg/dL
Leukocytes,Ua: NEGATIVE
Nitrite: NEGATIVE
Protein, ur: NEGATIVE mg/dL
Specific Gravity, Urine: 1.024 (ref 1.005–1.030)
pH: 5 (ref 5.0–8.0)

## 2020-01-04 LAB — I-STAT ARTERIAL BLOOD GAS, ED
Acid-Base Excess: 1 mmol/L (ref 0.0–2.0)
Bicarbonate: 28.1 mmol/L — ABNORMAL HIGH (ref 20.0–28.0)
Calcium, Ion: 1.27 mmol/L (ref 1.15–1.40)
HCT: 25 % — ABNORMAL LOW (ref 36.0–46.0)
Hemoglobin: 8.5 g/dL — ABNORMAL LOW (ref 12.0–15.0)
O2 Saturation: 100 %
Potassium: 7.5 mmol/L (ref 3.5–5.1)
Sodium: 143 mmol/L (ref 135–145)
TCO2: 30 mmol/L (ref 22–32)
pCO2 arterial: 57.8 mmHg — ABNORMAL HIGH (ref 32.0–48.0)
pH, Arterial: 7.294 — ABNORMAL LOW (ref 7.350–7.450)
pO2, Arterial: 304 mmHg — ABNORMAL HIGH (ref 83.0–108.0)

## 2020-01-04 LAB — BASIC METABOLIC PANEL
Anion gap: 13 (ref 5–15)
Anion gap: 16 — ABNORMAL HIGH (ref 5–15)
Anion gap: 13 (ref 5–15)
BUN: 95 mg/dL — ABNORMAL HIGH (ref 8–23)
BUN: 91 mg/dL — ABNORMAL HIGH (ref 8–23)
BUN: 86 mg/dL — ABNORMAL HIGH (ref 8–23)
CO2: 24 mmol/L (ref 22–32)
CO2: 18 mmol/L — ABNORMAL LOW (ref 22–32)
CO2: 21 mmol/L — ABNORMAL LOW (ref 22–32)
Calcium: 9.6 mg/dL (ref 8.9–10.3)
Calcium: 9.4 mg/dL (ref 8.9–10.3)
Calcium: 8.8 mg/dL — ABNORMAL LOW (ref 8.9–10.3)
Chloride: 116 mmol/L — ABNORMAL HIGH (ref 98–111)
Chloride: 119 mmol/L — ABNORMAL HIGH (ref 98–111)
Chloride: 117 mmol/L — ABNORMAL HIGH (ref 98–111)
Creatinine, Ser: 3.05 mg/dL — ABNORMAL HIGH (ref 0.44–1.00)
Creatinine, Ser: 2.8 mg/dL — ABNORMAL HIGH (ref 0.44–1.00)
Creatinine, Ser: 2.72 mg/dL — ABNORMAL HIGH (ref 0.44–1.00)
GFR calc Af Amer: 17 mL/min — ABNORMAL LOW (ref >60)
GFR calc Af Amer: 19 mL/min — ABNORMAL LOW (ref >60)
GFR calc Af Amer: 20 mL/min — ABNORMAL LOW (ref >60)
GFR calc non Af Amer: 15 mL/min — ABNORMAL LOW (ref >60)
GFR calc non Af Amer: 16 mL/min — ABNORMAL LOW (ref >60)
GFR calc non Af Amer: 17 mL/min — ABNORMAL LOW (ref >60)
Glucose, Bld: 594 mg/dL (ref 70–99)
Glucose, Bld: 259 mg/dL — ABNORMAL HIGH (ref 70–99)
Glucose, Bld: 204 mg/dL — ABNORMAL HIGH (ref 70–99)
Potassium: 4.7 mmol/L (ref 3.5–5.1)
Potassium: 5.1 mmol/L (ref 3.5–5.1)
Potassium: 5.6 mmol/L — ABNORMAL HIGH (ref 3.5–5.1)
Sodium: 153 mmol/L — ABNORMAL HIGH (ref 135–145)
Sodium: 153 mmol/L — ABNORMAL HIGH (ref 135–145)
Sodium: 151 mmol/L — ABNORMAL HIGH (ref 135–145)

## 2020-01-04 LAB — TRIGLYCERIDES
Triglycerides: 120 mg/dL (ref <150)
Triglycerides: 106 mg/dL (ref <150)

## 2020-01-04 LAB — CBG MONITORING, ED
Glucose-Capillary: >600 mg/dL (ref 70–99)
Glucose-Capillary: >600 mg/dL (ref 70–99)
Glucose-Capillary: >600 mg/dL (ref 70–99)
Glucose-Capillary: >600 mg/dL (ref 70–99)

## 2020-01-04 LAB — COMPREHENSIVE METABOLIC PANEL
ALT: 19 U/L (ref 0–44)
AST: 15 U/L (ref 15–41)
Albumin: 2.5 g/dL — ABNORMAL LOW (ref 3.5–5.0)
Alkaline Phosphatase: 94 U/L (ref 38–126)
Anion gap: 11 (ref 5–15)
BUN: 101 mg/dL — ABNORMAL HIGH (ref 8–23)
CO2: 22 mmol/L (ref 22–32)
Calcium: 8.7 mg/dL — ABNORMAL LOW (ref 8.9–10.3)
Chloride: 108 mmol/L (ref 98–111)
Creatinine, Ser: 3.15 mg/dL — ABNORMAL HIGH (ref 0.44–1.00)
GFR calc Af Amer: 17 mL/min — ABNORMAL LOW (ref >60)
GFR calc non Af Amer: 14 mL/min — ABNORMAL LOW (ref >60)
Glucose, Bld: 1306 mg/dL (ref 70–99)
Potassium: >7.5 mmol/L (ref 3.5–5.1)
Sodium: 141 mmol/L (ref 135–145)
Total Bilirubin: 0.8 mg/dL (ref 0.3–1.2)
Total Protein: 6.1 g/dL — ABNORMAL LOW (ref 6.5–8.1)

## 2020-01-04 LAB — CBC WITH DIFFERENTIAL/PLATELET
Abs Immature Granulocytes: 0.13 10*3/uL — ABNORMAL HIGH (ref 0.00–0.07)
Basophils Absolute: 0.1 10*3/uL (ref 0.0–0.1)
Basophils Relative: 0 %
Eosinophils Absolute: 0 10*3/uL (ref 0.0–0.5)
Eosinophils Relative: 0 %
HCT: 30.6 % — ABNORMAL LOW (ref 36.0–46.0)
Hemoglobin: 8.4 g/dL — ABNORMAL LOW (ref 12.0–15.0)
Immature Granulocytes: 1 %
Lymphocytes Relative: 3 %
Lymphs Abs: 0.6 10*3/uL — ABNORMAL LOW (ref 0.7–4.0)
MCH: 29 pg (ref 26.0–34.0)
MCHC: 27.5 g/dL — ABNORMAL LOW (ref 30.0–36.0)
MCV: 105.5 fL — ABNORMAL HIGH (ref 80.0–100.0)
Monocytes Absolute: 0.7 10*3/uL (ref 0.1–1.0)
Monocytes Relative: 4 %
Neutro Abs: 16.4 10*3/uL — ABNORMAL HIGH (ref 1.7–7.7)
Neutrophils Relative %: 92 %
Platelets: 423 10*3/uL — ABNORMAL HIGH (ref 150–400)
RBC: 2.9 MIL/uL — ABNORMAL LOW (ref 3.87–5.11)
RDW: 15.4 % (ref 11.5–15.5)
WBC: 17.9 10*3/uL — ABNORMAL HIGH (ref 4.0–10.5)
nRBC: 0 % (ref 0.0–0.2)

## 2020-01-04 LAB — I-STAT CHEM 8, ED
BUN: 100 mg/dL — ABNORMAL HIGH (ref 8–23)
Calcium, Ion: 1.31 mmol/L (ref 1.15–1.40)
Chloride: 105 mmol/L (ref 98–111)
Creatinine, Ser: 3 mg/dL — ABNORMAL HIGH (ref 0.44–1.00)
Glucose, Bld: >700 mg/dL (ref 70–99)
HCT: 30 % — ABNORMAL LOW (ref 36.0–46.0)
Hemoglobin: 10.2 g/dL — ABNORMAL LOW (ref 12.0–15.0)
Potassium: 7.6 mmol/L (ref 3.5–5.1)
Sodium: 143 mmol/L (ref 135–145)
TCO2: 27 mmol/L (ref 22–32)

## 2020-01-04 LAB — GLUCOSE, CAPILLARY
Glucose-Capillary: 518 mg/dL (ref 70–99)
Glucose-Capillary: 264 mg/dL — ABNORMAL HIGH (ref 70–99)
Glucose-Capillary: 273 mg/dL — ABNORMAL HIGH (ref 70–99)
Glucose-Capillary: 207 mg/dL — ABNORMAL HIGH (ref 70–99)
Glucose-Capillary: 138 mg/dL — ABNORMAL HIGH (ref 70–99)
Glucose-Capillary: 138 mg/dL — ABNORMAL HIGH (ref 70–99)
Glucose-Capillary: 147 mg/dL — ABNORMAL HIGH (ref 70–99)
Glucose-Capillary: 136 mg/dL — ABNORMAL HIGH (ref 70–99)
Glucose-Capillary: 163 mg/dL — ABNORMAL HIGH (ref 70–99)
Glucose-Capillary: 208 mg/dL — ABNORMAL HIGH (ref 70–99)

## 2020-01-04 LAB — I-STAT VENOUS BLOOD GAS, ED
Acid-Base Excess: 2 mmol/L (ref 0.0–2.0)
Bicarbonate: 28.5 mmol/L — ABNORMAL HIGH (ref 20.0–28.0)
Calcium, Ion: 1.26 mmol/L (ref 1.15–1.40)
HCT: 30 % — ABNORMAL LOW (ref 36.0–46.0)
Hemoglobin: 10.2 g/dL — ABNORMAL LOW (ref 12.0–15.0)
O2 Saturation: 98 %
Potassium: 7.6 mmol/L (ref 3.5–5.1)
Sodium: 142 mmol/L (ref 135–145)
TCO2: 30 mmol/L (ref 22–32)
pCO2, Ven: 54 mmHg (ref 44.0–60.0)
pH, Ven: 7.33 (ref 7.250–7.430)
pO2, Ven: 121 mmHg — ABNORMAL HIGH (ref 32.0–45.0)

## 2020-01-04 LAB — CBC
HCT: 31.4 % — ABNORMAL LOW (ref 36.0–46.0)
HCT: 30.4 % — ABNORMAL LOW (ref 36.0–46.0)
Hemoglobin: 9.2 g/dL — ABNORMAL LOW (ref 12.0–15.0)
Hemoglobin: 9.1 g/dL — ABNORMAL LOW (ref 12.0–15.0)
MCH: 29 pg (ref 26.0–34.0)
MCH: 29.9 pg (ref 26.0–34.0)
MCHC: 29.3 g/dL — ABNORMAL LOW (ref 30.0–36.0)
MCHC: 29.9 g/dL — ABNORMAL LOW (ref 30.0–36.0)
MCV: 99.1 fL (ref 80.0–100.0)
MCV: 100 fL (ref 80.0–100.0)
Platelets: 410 10*3/uL — ABNORMAL HIGH (ref 150–400)
Platelets: 398 10*3/uL (ref 150–400)
RBC: 3.17 MIL/uL — ABNORMAL LOW (ref 3.87–5.11)
RBC: 3.04 MIL/uL — ABNORMAL LOW (ref 3.87–5.11)
RDW: 14.6 % (ref 11.5–15.5)
RDW: 14.9 % (ref 11.5–15.5)
WBC: 16.4 10*3/uL — ABNORMAL HIGH (ref 4.0–10.5)
WBC: 18.8 10*3/uL — ABNORMAL HIGH (ref 4.0–10.5)
nRBC: 0 % (ref 0.0–0.2)
nRBC: 0.1 % (ref 0.0–0.2)

## 2020-01-04 LAB — VALPROIC ACID LEVEL: Valproic Acid Lvl: 22 ug/mL — ABNORMAL LOW (ref 50.0–100.0)

## 2020-01-04 LAB — LACTIC ACID, PLASMA
Lactic Acid, Venous: 3.3 mmol/L (ref 0.5–1.9)
Lactic Acid, Venous: 3.4 mmol/L (ref 0.5–1.9)
Lactic Acid, Venous: 6.8 mmol/L (ref 0.5–1.9)

## 2020-01-04 LAB — BETA-HYDROXYBUTYRIC ACID: Beta-Hydroxybutyric Acid: 0.22 mmol/L (ref 0.05–0.27)

## 2020-01-04 LAB — SARS CORONAVIRUS 2 BY RT PCR (HOSPITAL ORDER, PERFORMED IN ~~LOC~~ HOSPITAL LAB): SARS Coronavirus 2: NEGATIVE

## 2020-01-04 LAB — PHOSPHORUS
Phosphorus: 5.1 mg/dL — ABNORMAL HIGH (ref 2.5–4.6)
Phosphorus: 4.8 mg/dL — ABNORMAL HIGH (ref 2.5–4.6)

## 2020-01-04 LAB — OSMOLALITY: Osmolality: 415 mOsm/kg (ref 275–295)

## 2020-01-04 LAB — MAGNESIUM
Magnesium: 2.7 mg/dL — ABNORMAL HIGH (ref 1.7–2.4)
Magnesium: 2.7 mg/dL — ABNORMAL HIGH (ref 1.7–2.4)

## 2020-01-04 LAB — MRSA PCR SCREENING: MRSA by PCR: NEGATIVE

## 2020-01-04 MED ORDER — ETOMIDATE 2 MG/ML IV SOLN
INTRAVENOUS | Status: AC | PRN
  Administered 2020-01-04: 20 mg via INTRAVENOUS

## 2020-01-04 MED ORDER — LORAZEPAM 2 MG/ML IJ SOLN
INTRAMUSCULAR | Status: AC
  Administered 2020-01-04: 2 mg
  Filled 2020-01-04: qty 1

## 2020-01-04 MED ORDER — DOCUSATE SODIUM 100 MG PO CAPS: 100.0000 mg | ORAL_CAPSULE | Freq: Two times a day (BID) | ORAL | Status: DC | PRN

## 2020-01-04 MED ORDER — NOREPINEPHRINE 4 MG/250ML-% IV SOLN
INTRAVENOUS | Status: AC
  Filled 2020-01-04: qty 250

## 2020-01-04 MED ORDER — DOCUSATE SODIUM 50 MG/5ML PO LIQD
100.0000 mg | Freq: Two times a day (BID) | ORAL | Status: DC
  Administered 2020-01-04: 100 mg via ORAL
  Filled 2020-01-04 (×3): qty 10

## 2020-01-04 MED ORDER — NOREPINEPHRINE 4 MG/250ML-% IV SOLN
0.0000 ug/min | INTRAVENOUS | Status: DC
  Administered 2020-01-04 (×2): 20 ug/min via INTRAVENOUS
  Administered 2020-01-04: 10 ug/min via INTRAVENOUS
  Administered 2020-01-05: 18 ug/min via INTRAVENOUS
  Administered 2020-01-05 (×2): 20 ug/min via INTRAVENOUS
  Administered 2020-01-05: 19 ug/min via INTRAVENOUS
  Administered 2020-01-05: 17 ug/min via INTRAVENOUS
  Administered 2020-01-06: 30 ug/min via INTRAVENOUS
  Filled 2020-01-04 (×2): qty 250
  Filled 2020-01-04 (×2): qty 500
  Filled 2020-01-04 (×7): qty 250

## 2020-01-04 MED ORDER — SODIUM CHLORIDE 0.9 % IV SOLN
2.0000 g | INTRAVENOUS | Status: DC
  Administered 2020-01-04 – 2020-01-05 (×2): 2 g via INTRAVENOUS
  Filled 2020-01-04 (×2): qty 2

## 2020-01-04 MED ORDER — SODIUM CHLORIDE 0.9 % IV BOLUS
1000.0000 mL | Freq: Once | INTRAVENOUS | Status: AC
  Administered 2020-01-04: 1000 mL via INTRAVENOUS

## 2020-01-04 MED ORDER — POLYETHYLENE GLYCOL 3350 17 G PO PACK: 17.0000 g | PACK | Freq: Every day | ORAL | Status: DC | PRN

## 2020-01-04 MED ORDER — SODIUM CHLORIDE 0.9 % IV SOLN: INTRAVENOUS | Status: DC

## 2020-01-04 MED ORDER — MIDAZOLAM HCL 2 MG/2ML IJ SOLN
2.0000 mg | Freq: Once | INTRAMUSCULAR | Status: AC
  Administered 2020-01-04: 2 mg via INTRAVENOUS

## 2020-01-04 MED ORDER — LORAZEPAM 2 MG/ML IJ SOLN
INTRAMUSCULAR | Status: AC
  Filled 2020-01-04: qty 1

## 2020-01-04 MED ORDER — LACTATED RINGERS IV BOLUS
1000.0000 mL | Freq: Once | INTRAVENOUS | Status: AC
  Administered 2020-01-04: 1000 mL via INTRAVENOUS

## 2020-01-04 MED ORDER — SODIUM CHLORIDE 0.9 % IV SOLN
750.0000 mg | Freq: Two times a day (BID) | INTRAVENOUS | Status: DC
  Administered 2020-01-05 – 2020-01-07 (×5): 750 mg via INTRAVENOUS
  Filled 2020-01-04 (×9): qty 7.5

## 2020-01-04 MED ORDER — ROCURONIUM BROMIDE 50 MG/5ML IV SOLN
INTRAVENOUS | Status: AC | PRN
  Administered 2020-01-04: 80 mg via INTRAVENOUS

## 2020-01-04 MED ORDER — CHLORHEXIDINE GLUCONATE 0.12% ORAL RINSE (MEDLINE KIT)
15.0000 mL | Freq: Two times a day (BID) | OROMUCOSAL | Status: DC
  Administered 2020-01-04 – 2020-01-06 (×5): 15 mL via OROMUCOSAL

## 2020-01-04 MED ORDER — PROPOFOL 1000 MG/100ML IV EMUL
5.0000 ug/kg/min | INTRAVENOUS | Status: DC
  Administered 2020-01-04 (×2): 80 ug/kg/min via INTRAVENOUS
  Administered 2020-01-04: 10 ug/kg/min via INTRAVENOUS
  Administered 2020-01-05 (×8): 100 ug/kg/min via INTRAVENOUS
  Administered 2020-01-05: 80 ug/kg/min via INTRAVENOUS
  Administered 2020-01-05 – 2020-01-06 (×5): 100 ug/kg/min via INTRAVENOUS
  Filled 2020-01-04 (×5): qty 100
  Filled 2020-01-04: qty 200
  Filled 2020-01-04 (×2): qty 100
  Filled 2020-01-04: qty 200
  Filled 2020-01-04: qty 100
  Filled 2020-01-04 (×2): qty 200
  Filled 2020-01-04 (×2): qty 100

## 2020-01-04 MED ORDER — POLYETHYLENE GLYCOL 3350 17 G PO PACK: 17.0000 g | PACK | Freq: Every day | ORAL | Status: DC

## 2020-01-04 MED ORDER — MIDAZOLAM HCL 2 MG/2ML IJ SOLN
INTRAMUSCULAR | Status: AC
  Administered 2020-01-04: 2 mg
  Filled 2020-01-04: qty 2

## 2020-01-04 MED ORDER — LORAZEPAM 2 MG/ML IJ SOLN
INTRAMUSCULAR | Status: AC
  Administered 2020-01-04: 2 mg via INTRAVENOUS
  Filled 2020-01-04: qty 1

## 2020-01-04 MED ORDER — ONDANSETRON HCL 4 MG/2ML IJ SOLN: 4.0000 mg | Freq: Four times a day (QID) | INTRAMUSCULAR | Status: DC | PRN

## 2020-01-04 MED ORDER — PROPOFOL 1000 MG/100ML IV EMUL
INTRAVENOUS | Status: AC
  Filled 2020-01-04: qty 100

## 2020-01-04 MED ORDER — VANCOMYCIN VARIABLE DOSE PER UNSTABLE RENAL FUNCTION (PHARMACIST DOSING): Status: DC

## 2020-01-04 MED ORDER — VANCOMYCIN HCL 1500 MG/300ML IV SOLN
1500.0000 mg | Freq: Once | INTRAVENOUS | Status: AC
  Administered 2020-01-04: 1500 mg via INTRAVENOUS
  Filled 2020-01-04 (×2): qty 300

## 2020-01-04 MED ORDER — VALPROATE SODIUM 500 MG/5ML IV SOLN
500.0000 mg | Freq: Three times a day (TID) | INTRAVENOUS | Status: DC
  Administered 2020-01-04 – 2020-01-07 (×9): 500 mg via INTRAVENOUS
  Filled 2020-01-04 (×13): qty 5

## 2020-01-04 MED ORDER — SODIUM CHLORIDE 0.9 % IV SOLN
INTRAVENOUS | Status: DC | PRN
  Administered 2020-01-04: 10 mL/h via INTRAVENOUS

## 2020-01-04 MED ORDER — SODIUM CHLORIDE 0.9 % IV SOLN
20.0000 mg/kg | Freq: Once | INTRAVENOUS | Status: AC
  Administered 2020-01-04: 1484 mg via INTRAVENOUS
  Filled 2020-01-04: qty 29.68

## 2020-01-04 MED ORDER — VALPROATE SODIUM 500 MG/5ML IV SOLN
2000.0000 mg | INTRAVENOUS | Status: AC
  Administered 2020-01-04: 2000 mg via INTRAVENOUS
  Filled 2020-01-04: qty 20

## 2020-01-04 MED ORDER — PANTOPRAZOLE SODIUM 40 MG IV SOLR
40.0000 mg | Freq: Every day | INTRAVENOUS | Status: DC
  Administered 2020-01-04: 40 mg via INTRAVENOUS
  Filled 2020-01-04: qty 40

## 2020-01-04 MED ORDER — PROPOFOL 1000 MG/100ML IV EMUL: 5.0000 ug/kg/min | INTRAVENOUS | Status: DC

## 2020-01-04 MED ORDER — LACTATED RINGERS IV SOLN: INTRAVENOUS | Status: DC

## 2020-01-04 MED ORDER — ACETAMINOPHEN 325 MG PO TABS
650.0000 mg | ORAL_TABLET | ORAL | Status: DC | PRN
  Administered 2020-01-07: 650 mg via ORAL
  Filled 2020-01-04: qty 2

## 2020-01-04 MED ORDER — ORAL CARE MOUTH RINSE
15.0000 mL | OROMUCOSAL | Status: DC
  Administered 2020-01-04 – 2020-01-07 (×27): 15 mL via OROMUCOSAL

## 2020-01-04 MED ORDER — SODIUM CHLORIDE 0.9 % IV SOLN: 250.0000 mL | INTRAVENOUS | Status: DC

## 2020-01-04 MED ORDER — DEXTROSE 50 % IV SOLN: 0.0000 mL | INTRAVENOUS | Status: DC | PRN

## 2020-01-04 MED ORDER — SODIUM CHLORIDE 0.9 % IV SOLN
200.0000 mg | INTRAVENOUS | Status: AC
  Administered 2020-01-04: 200 mg via INTRAVENOUS
  Filled 2020-01-04: qty 20

## 2020-01-04 MED ORDER — LEVETIRACETAM IN NACL 1000 MG/100ML IV SOLN: 1000.0000 mg | Freq: Two times a day (BID) | INTRAVENOUS | Status: DC

## 2020-01-04 MED ORDER — LORAZEPAM 2 MG/ML IJ SOLN: 2.0000 mg | Freq: Once | INTRAMUSCULAR | Status: AC

## 2020-01-04 MED ORDER — SODIUM CHLORIDE 0.9 % IV SOLN
100.0000 mg | Freq: Two times a day (BID) | INTRAVENOUS | Status: DC
  Filled 2020-01-04: qty 10

## 2020-01-04 MED ORDER — DEXTROSE-NACL 5-0.45 % IV SOLN: INTRAVENOUS | Status: DC

## 2020-01-04 MED ORDER — SODIUM CHLORIDE 0.9 % IV SOLN
3500.0000 mg | Freq: Once | INTRAVENOUS | Status: AC
  Administered 2020-01-04: 3500 mg via INTRAVENOUS
  Filled 2020-01-04: qty 35

## 2020-01-04 MED ORDER — DOCUSATE SODIUM 50 MG/5ML PO LIQD
100.0000 mg | Freq: Two times a day (BID) | ORAL | Status: DC
  Administered 2020-01-05 – 2020-01-06 (×4): 100 mg
  Filled 2020-01-04 (×4): qty 10

## 2020-01-04 MED ORDER — CALCIUM GLUCONATE 10 % IV SOLN
1.0000 g | Freq: Once | INTRAVENOUS | Status: AC
  Administered 2020-01-04: 1 g via INTRAVENOUS
  Filled 2020-01-04: qty 10

## 2020-01-04 MED ORDER — INSULIN REGULAR(HUMAN) IN NACL 100-0.9 UT/100ML-% IV SOLN
INTRAVENOUS | Status: DC
  Administered 2020-01-04: 1.1 [IU]/h via INTRAVENOUS
  Administered 2020-01-04: 9.5 [IU]/h via INTRAVENOUS
  Filled 2020-01-04 (×3): qty 100

## 2020-01-04 MED ORDER — INSULIN ASPART 100 UNIT/ML ~~LOC~~ SOLN
5.0000 [IU] | Freq: Once | SUBCUTANEOUS | Status: AC
  Administered 2020-01-04: 5 [IU] via INTRAVENOUS

## 2020-01-04 MED ORDER — CHLORHEXIDINE GLUCONATE CLOTH 2 % EX PADS
6.0000 | MEDICATED_PAD | Freq: Every day | CUTANEOUS | Status: DC
  Administered 2020-01-04 – 2020-01-06 (×3): 6 via TOPICAL

## 2020-01-04 MED ORDER — HEPARIN SODIUM (PORCINE) 5000 UNIT/ML IJ SOLN
5000.0000 [IU] | Freq: Three times a day (TID) | INTRAMUSCULAR | Status: DC
  Administered 2020-01-04 – 2020-01-07 (×9): 5000 [IU] via SUBCUTANEOUS
  Filled 2020-01-04 (×9): qty 1

## 2020-01-04 NOTE — Progress Notes (Signed)
Interim note  Reviewed EEG: Appears to be burst suppression pattern.  CCM start patient on Depakote 500 3 times daily, so would not continue Dilantin.  Continue Keppra 750 twice daily.  Signed out to Dr. Leonel Ramsay

## 2020-01-04 NOTE — Procedures (Signed)
Central Venous Catheter Insertion Procedure Note  AMBRIELLE KINGTON  [id number]  1950-08-30  Date:01/02/2020  Time:5:25 PM   Provider Performing:Kenecia Barren B Ishmael Holter    Procedure: Insertion of Non-tunneled Central Venous Catheter(36556) with US guidance (21224)    Indication(s) Medication administration    Consent Unable to obtain consent due to emergent nature of procedure. Attempted to call daughter several times, went immediately to voice mail. Patient requiring escalating vasopressors, no IV access for abx.   Anesthesia Sedated with propofol and PRNs  Timeout Verified patient identification, verified procedure, site/side was marked, verified correct patient position, special equipment/implants available, medications/allergies/relevant history reviewed, required imaging and test results available.  Sterile Technique Maximal sterile technique including full sterile barrier drape, hand hygiene, sterile gown, sterile gloves, mask, hair covering, sterile ultrasound probe cover (if used).  Procedure Description Area of catheter insertion was cleaned with chlorhexidine and draped in sterile fashion.  With real-time ultrasound guidance a central venous catheter was placed into the right internal jugular vein. Nonpulsatile blood flow and easy flushing noted in all ports.  The catheter was sutured in place and sterile dressing applied.  Complications/Tolerance None; patient tolerated the procedure well. Chest X-ray is ordered to verify placement for internal jugular or subclavian cannulation.   Chest x-ray is not ordered for femoral cannulation.  EBL Minimal  Specimen(s) None

## 2020-01-04 NOTE — Consult Note (Addendum)
Requesting Physician: Dr. Barbee Cough    Chief Complaint: Seizure like activity   History obtained from: EDP and Chart    HPI:                                                                                                                                       Kaily A Terra is a 69 y.o. 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  Received 5 mg of Versed in route however started to seize again.  On arrival to [facility], ER patient unresponsive.  CBG greater than 600.  Patient placed on ventilator for airway protection.  Received 3500 mg Keppra IV load (50mg /kg).  Continues to have left facial twitching and neurology was consulted.    Past Medical History:  Diagnosis Date  . CVA (cerebral vascular accident) ([place])   . HTN (hypertension)   . Seizure [facility])     Past Surgical History:  Procedure Laterality Date  . CRANIOTOMY Left 11/20/2019   Procedure: LEFT CRANIOTOMY INTRACRANIAL ANEURYSM CLIPPING;  Surgeon: [name], MD;  Location: [place];  Service: Neurosurgery;  Laterality: Left;  . IR 3D INDEPENDENT WKST  11/20/2019  . IR ANGIO INTRA EXTRACRAN SEL INTERNAL CAROTID BILAT MOD SED  11/20/2019  . IR ANGIO VERTEBRAL SEL VERTEBRAL UNI L MOD SED  11/20/2019  . IR GASTROSTOMY TUBE MOD SED  12/07/2019  . IR US GUIDE VASC ACCESS RIGHT  11/20/2019  . RADIOLOGY WITH ANESTHESIA N/A 11/20/2019   Procedure: IR WITH ANESTHESIA;  Surgeon: [name], MD;  Location: [place];  Service: Radiology;  Laterality: N/A;    No family history on file. Social History:  has no history on file for tobacco use, alcohol use, and drug use.  Allergies:  Allergies  Allergen Reactions  . Penicillins Rash    Medications:                                                                                                                        I  reviewed home medications   ROS:  Unable to review systems due to patient's mental status   Examination:                                                                                                      General: Appears well-developed and well-nourished.  Psych: Affect appropriate to situation Eyes: No scleral injection HENT: No OP obstrucion Head: Normocephalic.  Cardiovascular: Normal rate and regular rhythm.  Respiratory: Effort normal and breath sounds normal to anterior ascultation GI: Soft.  No distension. There is no tenderness.  Skin: WDI    Neurological Examination Mental Status: Unresponsive, nonverbal and does not follow any commands Cranial Nerves: II: Pupils are equal and reactive III,IV,VI: doll's response absent bilaterally. V,VII: corneal reflex: Present VIII: patient does not respond to verbal stimuli IX,X: gag reflex present XI: trapezius strength unable to test bilaterally XII: tongue strength unable to test Motor: Extremities flaccid throughout.  No spontaneous movement noted.  No purposeful movements noted. Sensory: Does not respond to noxious stimuli in any extremity. Plantars: Mute Cerebellar: Unable to perform   Lab Results: Basic Metabolic Panel: Recent Labs  Lab 01/01/20 1003 01/10/2020 1135 12/14/2019 1136 12/31/2019 1137  NA 140 143 142 143  K 5.6* 7.6* 7.6* 7.5*  CL 106 105  --   --   CO2 25  --   --   --   GLUCOSE 381* >700*  --   --   BUN 44* 100*  --   --   CREATININE 1.37* 3.00*  --   --   CALCIUM 9.9  --   --   --     CBC: Recent Labs  Lab 01/01/20 1003 12/29/2019 1120 12/24/2019 1135 12/17/2019 1136 01/09/2020 1137  WBC 9.8 17.9*  --   --   --   NEUTROABS 7.4 16.4*  --   --   --   HGB 9.9* 8.4* 10.2* 10.2* 8.5*  HCT 32.9* 30.6* 30.0* 30.0* 25.0*  MCV 96.5 105.5*  --   --   --   PLT 459* 423*  --    --   --     Coagulation Studies: No results for input(s): LABPROT, INR in the last 72 hours.  Imaging: DG Chest Portable 1 View  Result Date: 12/16/2019 CLINICAL DATA:  Seizure. Intubation. EXAM: PORTABLE CHEST 1 VIEW COMPARISON:  01/01/2020 FINDINGS: The endotracheal tube is in the right mainstem bronchus approximately 3 cm and should be retracted at least 5-6 cm. The NG tube is coursing down the esophagus into the stomach. Marked hyperinflation of the right lung and marked hypoaeration of the left lung because of the position of the ET tube. IMPRESSION: 1. Right mainstem bronchus intubation. This should be retracted at least 5-6 cm. 2. Marked hypoaeration of the left lung and hyperinflation of the right lung. These results were called by telephone at the time of interpretation on 12/17/2019 at 12:19 pm to provider [facility] , who verbally acknowledged these results. Electronically Signed   By: Marijo Sanes M.D.   On: 12/23/2019 12:20     I have reviewed the above  imaging: CT head   ASSESSMENT AND PLAN  69 y.o. 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  Stat EEG shows multiple seizures, patient is in focal status epilepticus.  CT head shows improving subarachnoid hemorrhage.    Focal status epilepticus Subarachnoid hemorrhage status post left MCA aneurysm coiling Hyperglycemia  Recommendations Continuous EEG for 24 hours Loaded with fosphenytoin 20 mg per KG and start 100 mg 3 times daily for maintenance Continue Keppra 784m g twice daily (renal dose) May consider starting patient on propofol Manage hyperglycemia and electrolyte abnormalities per CCM    CRITICAL CARE Performed by: Lanice Schwab Tyliah Schlereth   Total critical care time: 45 minutes  Critical care time was exclusive of separately billable  procedures and treating other patients.  Critical care was necessary to treat or prevent imminent or life-threatening deterioration.  Critical care was time spent personally by me on the following activities: development of treatment plan with patient and/or surrogate as well as nursing, discussions with consultants, evaluation of patient's response to treatment, examination of patient, obtaining history from patient or surrogate, ordering and performing treatments and interventions, ordering and review of laboratory studies, ordering and review of radiographic studies, pulse oximetry and re-evaluation of patient's condition.   Emilyrose Darrah [facility] Pager Number [phone]

## 2020-01-04 NOTE — H&P (Signed)
NAME:  [place], MRN:  [id number], DOB:  [birth year], LOS: 0 ADMISSION DATE:  01/02/2020, CONSULTATION DATE: 12/21/2019 REFERRING MD:  ED CHIEF COMPLAINT: Seizures  Brief History   69 yo female with recent admission in HTN, June for [facility] s/p crani and clipping admitted for seizures in setting of hyperglycemia  History of present illness    Patient recently evaluated in the ED on 7/19 for accidental tracheostomy removal.  It was felt at that time she was safe to have tracheostomy remain out.  Incidentally, labs were notable for hyperglycemia. She was started on Metformin with instructions to follow up with PCP.  Today, patient presents to ED from SNF with seizures.  She received 5 mg versed IM with cessation of seizure activity.  Subsequently had recurrent seizures, Given additional 5 mg IV versed. Noted to be unresponsive.  SpO2 noted to be in the 70's. Ultimately required intubation.   She received 3.5 gm of Keppra in the ED and Neurology consulted for further evaluation. EEG in the ED reads focal motor seizure of the left hemisphere, maximal left frontal. Additional Fosphenytoin given by Neurology.   Past Medical History  Seizure Hypertension SAH s/p crani with clipping Tracheostomy 6/17, decannulated 7/19  Significant Hospital Events   Admit 7/22  Consults:  PCCM 7/22 Neuro 7/22  Procedures:    Significant Diagnostic Tests:  [facility] 7/22: chronic appearing left MCA infarct, low density fluid collection left frontal lobe 4 mm (previously 6 mm).  Chronic left subdural hygroma  Micro Data:  COVID Negative 7/22 Blood Cx 7/22 >> Respiratory Cx 7/22 >>  Antimicrobials:  Vanc 7/22 Cefepime 7/22  Interim history/subjective:  New admit, see above.   Objective   Blood pressure (!) 113/100, pulse (!) 146, temperature (!) 96.4 F (35.8 C), temperature source Tympanic, resp. rate (!) 24, SpO2 99 %.    Vent Mode: PRVC FiO2 (%):  [100 %] 100 % Set Rate:  [18 bmp] 18 bmp Vt Set:   [470 mL] 470 mL PEEP:  [5 cmH20] 5 cmH20 Plateau Pressure:  [25 cmH20] 25 cmH20   Intake/Output Summary (Last 24 hours) at 12/17/2019 1350 Last data filed at 12/15/2019 1149 Gross per 24 hour  Intake 1285 ml  Output --  Net 1285 ml   There were no vitals filed for this visit.  Examination: General: Ill appearing adult female intubated on no sedation HENT: [place]/AT Lungs: Scattered rhonchi, purulent yellow secretions via ETT Cardiovascular: warm and well perfused.  SR on telemetry  Abdomen:soft, non distended, NGT to LIWS with brown output Extremities: no deformities Neuro: PERRL, right corneal weak, no left corneal.  Weak gag. No sedation. Intermittent myoclonic like movement to the face.   Resolved Hospital Problem list     Assessment & Plan:  Focal status epilepticus --Neurology following --CTH without acute intracranial process: Known hygroma and left frontal fluid collection.  --EEG in left frontal hemisphere focal seizures --Etiology: Unclear infection vs DKA vs fluid collection --AED's per Neuro: Keppra, Fosphenytoin --Neuro considering addition of Propofol.   Acute hypoxic respiratory failure --Suspect aspiration with purulent secretions --Decannulated 7/19 --Initial SpO2 70's per EMS.  --Start empiric abx: Vanc/Cefepime. --Continue full vent support with lung protective strategies.   Shock, undifferentiated --medication induced vs sepsis --IVF resuscitation --Infectious work up and empiric abx. --Vasopressors if refractory to IVF resuscitation  DM, Type II, uncontrolled  --Hgb A1c 7.8 --Recently started on Metformin for hyperglycemia --Hold Metformin --Insulin drip  Elevated lactate --In setting of seizures and infectious  work up --Repeat with fluid resuscitation and seizure control  Leukocytosis Hypothermia --Temp 96.4 --Infectious work up: Blood Cx, UA, Respiratory Cx --NGT to LIWS with brown output, KUB to eval for obstruction/ileus --Empiric Abx:  Vanc/Cefepime  H/o HTN --Hold home amlodipine, hydralazine  Recent SAH s/p crani and clipping 11/2019 --Baseline neurological exam: Looks around, was able to state family members name. Moves all extremities. Able to stand with assistance   Recent LUE DVT --On eliquis as outpatient --Hold systemic a/c for now, start DVT ppx  Best practice:  Diet: NPO, NGT to LIWS Pain/Anxiety/Delirium protocol (if indicated): Per Neurology VAP protocol (if indicated): VAP Bundle DVT prophylaxis: Heparin subq GI prophylaxis: PPI Glucose control: See above Mobility: Bedrest.  Code Status: FULL Family Communication: Daughter: [employer], updated via phone Disposition: Admit to ICU with expected length of stay > 2 nights.   Labs   CBC: Recent Labs  Lab 01/01/20 1003 12/28/2019 1120 01/06/2020 1135 12/25/2019 1136 12/17/2019 1137  WBC 9.8 17.9*  --   --   --   NEUTROABS 7.4 16.4*  --   --   --   HGB 9.9* 8.4* 10.2* 10.2* 8.5*  HCT 32.9* 30.6* 30.0* 30.0* 25.0*  MCV 96.5 105.5*  --   --   --   PLT 459* 423*  --   --   --     Basic Metabolic Panel: Recent Labs  Lab 01/01/20 1003 01/06/2020 1135 01/10/2020 1136 01/05/2020 1137  NA 140 143 142 143  K 5.6* 7.6* 7.6* 7.5*  CL 106 105  --   --   CO2 25  --   --   --   GLUCOSE 381* >700*  --   --   BUN 44* 100*  --   --   CREATININE 1.37* 3.00*  --   --   CALCIUM 9.9  --   --   --    GFR: Estimated Creatinine Clearance: 18 mL/min (A) (by C-G formula based on SCr of 3 mg/dL (H)). Recent Labs  Lab 01/01/20 1003 01/03/2020 1120  WBC 9.8 17.9*  LATICACIDVEN  --  3.3*    Liver Function Tests: No results for input(s): AST, ALT, ALKPHOS, BILITOT, PROT, ALBUMIN in the last 168 hours. No results for input(s): LIPASE, AMYLASE in the last 168 hours. No results for input(s): AMMONIA in the last 168 hours.  ABG    Component Value Date/Time   PHART 7.294 (L) 12/26/2019 1137   PCO2ART 57.8 (H) 01/05/2020 1137   PO2ART 304 (H) 12/27/2019 1137   HCO3  28.1 (H) 12/29/2019 1137   TCO2 30 12/27/2019 1137   ACIDBASEDEF 2.0 12/05/2019 0300   O2SAT 100.0 01/09/2020 1137     Coagulation Profile: No results for input(s): INR, PROTIME in the last 168 hours.  Cardiac Enzymes: No results for input(s): CKTOTAL, CKMB, CKMBINDEX, TROPONINI in the last 168 hours.  HbA1C: Hgb A1c MFr Bld  Date/Time Value Ref Range Status  11/21/2019 10:55 AM 7.8 (H) 4.8 - 5.6 % Final    Comment:    (NOTE) Pre diabetes:          5.7%-6.4% Diabetes:              >6.4% Glycemic control for   <7.0% adults with diabetes     CBG: Recent Labs  Lab 12/29/19 0350 12/29/19 0747 12/29/19 1148 12/30/2019 1043 12/21/2019 1320  GLUCAP 266* 291* 237* >600* >600*    Review of Systems:   Unable to complete due to  critical illness.   Past Medical History  She,  has a past medical history of CVA (cerebral vascular accident) ([place]), HTN (hypertension), and Seizure ([place]).   Surgical History    Past Surgical History:  Procedure Laterality Date  . CRANIOTOMY Left 11/20/2019   Procedure: LEFT CRANIOTOMY INTRACRANIAL ANEURYSM CLIPPING;  Surgeon: [name], MD;  Location: [place];  Service: Neurosurgery;  Laterality: Left;  . IR 3D INDEPENDENT WKST  11/20/2019  . IR ANGIO INTRA EXTRACRAN SEL INTERNAL CAROTID BILAT MOD SED  11/20/2019  . IR ANGIO VERTEBRAL SEL VERTEBRAL UNI L MOD SED  11/20/2019  . IR GASTROSTOMY TUBE MOD SED  12/07/2019  . IR US GUIDE VASC ACCESS RIGHT  11/20/2019  . RADIOLOGY WITH ANESTHESIA N/A 11/20/2019   Procedure: IR WITH ANESTHESIA;  Surgeon: [name], MD;  Location: [place];  Service: Radiology;  Laterality: N/A;     Social History      Family History   Her family history is not on file.   Allergies Allergies  Allergen Reactions  . Penicillins Rash     Home Medications  Prior to Admission medications   Medication Sig Start Date End Date Taking? Authorizing Provider  amLODipine (NORVASC) 10 MG tablet Take 10 mg by mouth daily.  09/25/19  Yes [provider]  apixaban (ELIQUIS) 5 MG TABS tablet Place 1 tablet (5 mg total) into feeding tube 2 (two) times daily. 12/28/19  Yes Costella, Vista Mink, PA-C  hydrALAZINE (APRESOLINE) 50 MG tablet Take 50 mg by mouth 3 (three) times daily. 10/30/19  Yes [provider]  levETIRAcetam (KEPPRA) 100 MG/ML solution Place 5 mLs (500 mg total) into feeding tube 2 (two) times daily. 12/28/19  Yes Costella, Vista Mink, PA-C  linaclotide (LINZESS) 145 MCG CAPS capsule Take 145 mcg by mouth daily before breakfast.   Yes [provider]  metFORMIN (GLUCOPHAGE) 500 MG tablet Take 1 tablet (500 mg total) by mouth 2 (two) times daily. 01/01/20  Yes Dorie Rank, MD  Nutritional Supplements (FEEDING SUPPLEMENT, JEVITY 1.2 CAL,) LIQD Place 1,000 mLs into feeding tube continuous. 12/28/19  Yes Costella, Vista Mink, PA-C  omeprazole (PRILOSEC) 20 MG capsule Take 20 mg by mouth daily. 10/30/19  Yes [provider]  ondansetron (ZOFRAN) 4 MG tablet Take 4 mg by mouth 3 (three) times daily as needed for nausea or vomiting.    Yes [provider]  Pollen Extracts (PROSTAT PO) 30 mLs by Gastric Tube route in the morning, at noon, and at bedtime.   Yes [provider]  hydrOXYzine (ATARAX/VISTARIL) 10 MG tablet Place 10 mg into feeding tube in the morning and at bedtime.    [provider]  insulin lispro (HUMALOG) 100 UNIT/ML injection Inject 2-11 Units into the skin 2 (two) times daily as needed for high blood sugar (Check Sugar BID per [facility]). Sliding Scale- 0-200= 0 units 201-250= 2 units 251-300= 4 units 301-350= 6units 351-400= 10 units >400 = 11 units    [provider]  LORazepam (ATIVAN) 0.5 MG tablet Take 0.5 mg by mouth once.    [provider]  Nutritional Supplements (FEEDING SUPPLEMENT, PROSOURCE TF,) liquid Place 45 mLs into feeding tube 3 (three) times daily. Patient not taking: Reported on 01/06/2020 12/28/19   Traci Sermon, PA-C     Critical care time: [place], ACNP [facility]  After hours pager: [phone]

## 2020-01-04 NOTE — Progress Notes (Signed)
Despite a fairly suppressed burst-suppression pattern, patient continues to have intermittent right face and arm brief clonic episodes. Ativan x 1 and will add vimpat. Also check depakote level.   Roland Rack, MD [facility] [phone]  If 7pm- 7am, please page neurology on call as listed in [place].

## 2020-01-04 NOTE — Progress Notes (Signed)
R eye/facial twitching noted, EEG event button pressed. Will continue to monitor closely.

## 2020-01-04 NOTE — ED Triage Notes (Signed)
Pt here via EMS from [facility]. Pt seizing on EMS arrival with arm face, and head involvement, given 5mg  Versed IM with stop of seizure for a few mins. Then seizing again. Given a total of 5 mg versed IV. Staff reported her SpO2 was in the 65s. Pt unresponsive the whole time. CBG reading >600, although staff reports giving metformin and not insulin. Pt normally "babbles" and will say hello but is not conversational. Reports having to suction copious amounts of secretions out of pt's mouth. 250 NS bolus given by EMS.

## 2020-01-04 NOTE — Procedures (Addendum)
Patient Name: Rebecca Green  MRN: [id number]  Epilepsy Attending: Lora Havens  Referring Physician/Provider: Dr Karena Addison Aroor Date: 12/22/2019 Duration: 24.51 mins  Patient history: 69 y.o. 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  Received 5 mg of Versed in route however started to seize again.  On arrival to [facility], ER patient unresponsive.  CBG greater than 600. EEG to evaluate for seizure  Level of alertness:  Comatose  AEDs during EEG study: Versed, keppra  Technical aspects: This EEG study was done with scalp electrodes positioned according to the 10-20 International system of electrode placement. Electrical activity was acquired at a sampling rate of 500Hz  and reviewed with a high frequency filter of 70Hz  and a low frequency filter of 1Hz . EEG data were recorded continuously and digitally stored.   Description: Four seizures were noted at 1242, 1251, 1259 and 1301, lasting, 25-50 seconds during which patient was noted to have twitching of mouth and possibly right face twitching which was difficult to see on video. Concomitant eeg showed 5-6hz  theta slowing admixed with spikes in left hemisphere, maximal left frontal region which evolved into 2-3Hz  delta slowing. EEG also showed low amplitude continuous generalized 3-6Hz  theta-delta slowing. Frequent left hemispheric polyspikes were also noted during which at times patient had brief subtle head jerk.  Hyperventilation and photic stimulation were not performed.     ABNORMALITY - Focal motor seizure, left hemisphere, maximal let frontal - Polyspike, left hemisphere - Continued slow, generalized  IMPRESSION: This study showed four focal motor seizures as described above arising from left hemisphere, maximal left frontal region. Frequent brief subtle head jerks  were also noted with concomitant left hemispheric polyspikes and were focal motor seizures.Additionally, there is severe diffuse encephalopathy, nonspecific etiology but likely related to sedation, post-ictal state.   Dr Aroor was immediately notified.   [name] [name]

## 2020-01-04 NOTE — Progress Notes (Signed)
Dr. Leonel Ramsay notified of twitching of face/head/ and R arm. MD came to assess pt at bedside, 2mg  ativan given IV once per verbal order. See MAR.

## 2020-01-04 NOTE — Progress Notes (Signed)
Initial Nutrition Assessment  DOCUMENTATION CODES:   Not applicable  INTERVENTION:   Tube feeding via PEG: - Vital High Protein @ 25 ml/hr (600 ml/day) - ProSource TF 90 ml TID  Tube feeding regimen provides 840 kcal, 119 grams of protein, and 502 ml of H2O.   Tube feeding regimen and current propofol provides 1780 total kcal (>100% of needs).  NUTRITION DIAGNOSIS:   Inadequate oral intake related to inability to eat as evidenced by NPO status.  GOAL:   Provide needs based on ASPEN/SCCM guidelines  MONITOR:   Vent status, Labs, Weight trends, TF tolerance  REASON FOR ASSESSMENT:   Ventilator    ASSESSMENT:   69 year old female who [facility] 7/22 from [facility] with seizures. PMH of HTN, T2DM, SAH s/p crani and clipping, tracheostomy (self-decannulated on 01/01/20), PEG, and hospital admission from 11/19/19 to 12/29/19. Pt admitted with with seizures in the setting of hyperglycemia, shock and required intubation.   Discussed pt with RN and during ICU rounds.  Pt with continued seizures overnight.  Received consult for tube feeding initiation and management. OG tube in place. Pt also with PEG and current TF orders infusing via PEG.  Current TF orders: Vital High Protein @ 40 ml/hr, ProSource TF 45 ml BID, free water 200 ml q 4 hours  Patient is currently intubated on ventilator support MV: 8.5 L/min Temp (24hrs), Avg:97.7 F (36.5 C), Min:94.5 F (34.7 C), Max:99.4 F (37.4 C) BP (cuff): 110/55 MAP (cuff): 73  Drips: Propofol: 35.6 ml/hr (provides 940 kcal daily from lipid) Versed: 25 ml/hr Levophed: 67.5 ml/hr NS: 10 ml/hr  Medications reviewed and include: colace, protonix, SSI q 4 hours, Levemir 5 units q 12 hours, miralax, IV abx, Vimpat, Keppra, Depacon  Labs reviewed: sodium 151, potassium 5.5, hemoglobin 6.8 CBG's: 125-208 x 12 hours  I/O's: +6.2 L since admit  NUTRITION - FOCUSED PHYSICAL EXAM:    Most Recent Value  Orbital Region Mild depletion   Upper Arm Region No depletion  Thoracic and Lumbar Region Mild depletion  Buccal Region Unable to assess  Temple Region Unable to assess  Clavicle Bone Region Mild depletion  Clavicle and Acromion Bone Region Mild depletion  Scapular Bone Region Unable to assess  Dorsal Hand Mild depletion  Patellar Region Mild depletion  Anterior Thigh Region Mild depletion  Posterior Calf Region Mild depletion  Edema (RD Assessment) None  Hair Reviewed  Eyes Unable to assess  Mouth Unable to assess  Skin Reviewed  Nails Reviewed       Diet Order:   Diet Order            Diet NPO time specified  Diet effective now                 EDUCATION NEEDS:   No education needs have been identified at this time  Skin:  Skin Assessment: Reviewed RN Assessment  Last BM:  no documented BM  Height:   Ht Readings from Last 1 Encounters:  01/01/20 5\' 6"  (1.676 m)    Weight:   Wt Readings from Last 1 Encounters:  01/05/20 77.4 kg    Ideal Body Weight:  59.1 kg  BMI:  Body mass index is 27.54 kg/m.  Estimated Nutritional Needs:   Kcal:  1560  Protein:  110-125 grams  Fluid:  >/= 1.8 L    Gaynell Face, MS, RD, LDN Inpatient Clinical Dietitian Please see [facility] for contact information.

## 2020-01-04 NOTE — Progress Notes (Signed)
LTM successfully moved to 2M05; pt being monitored by [facility]. Tested event button.

## 2020-01-04 NOTE — Progress Notes (Signed)
Dr. Leonel Ramsay notified of continued twitching of R face and R hand despite interventions. Verbal order to increase propofol to 100. MD also notified of valproic acid level 22. Will continue to monitor closely.

## 2020-01-04 NOTE — Progress Notes (Signed)
Pharmacy Antibiotic Note  Rebecca Green is a 69 y.o. female admitted on 01/08/2020 with status epilepticus, concern for sepsis .  Pharmacy has been consulted for vancomycin and cefepime dosing.  Plan: Vancomycin 1500 mg IV x 1, then variable dosing due to unstable renal function Cefepime 2g IV every 24 hours Monitor renal function, Cx and clinical progression to narrow Vancomycin level as needed     Temp (24hrs), Avg:96.4 F (35.8 C), Min:96.4 F (35.8 C), Max:96.4 F (35.8 C)  Recent Labs  Lab 01/01/20 1003 12/27/2019 1120 12/25/2019 1135 01/08/2020 1303  WBC 9.8 17.9*  --   --   CREATININE 1.37* 3.15* 3.00*  --   LATICACIDVEN  --  3.3*  --  3.4*    Estimated Creatinine Clearance: 18 mL/min (A) (by C-G formula based on SCr of 3 mg/dL (H)).    Allergies  Allergen Reactions  . Penicillins Rash    Antimicrobials this admission: Vanc 7/22>> Cefepime 7/22>>  Dose adjustments this admission: n/a  Microbiology results: 7/22 BCx: sent 7/22 UCx: sent  Bertis Ruddy, PharmD Clinical Pharmacist ED Pharmacist Phone # [phone] 01/10/2020 3:25 PM

## 2020-01-04 NOTE — ED Provider Notes (Signed)
[facility] EMERGENCY DEPARTMENT Provider Note   CSN: [id number] Arrival date & time: 01/10/2020  1032     History Chief Complaint  Patient presents with  . Seizures    Rebecca Green is a 69 y.o. female.  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  Per EMS, pt had episode of seizure lasting 15 minutes at her facility. Given versed by EMS, total of 51m-5mg IM, followed by total 517mIV.   On arrival, pt initially unresponsive, then noted to have repeat seizures.  Right arm and face twitching  History limited by altered mental status, acuity of condition.     Past Medical History:  Diagnosis Date  . CVA (cerebral vascular accident) (HCL[place]  . HTN (hypertension)   . [facility]    Patient Active Problem List   Diagnosis Date Noted  . Status epilepticus (HC[place]07/22/2021  . Endotracheal tube present   . AKI (acute kidney injury) ([facility]  . SAH (subarachnoid hemorrhage) (HC[place]  . Status post tracheostomy (HC[place]  . Acute respiratory failure (H[facility]  . Aneurysmal subarachnoid hemorrhage (HCM[place]06/11/2019    Past Surgical History:  Procedure Laterality Date  . CRANIOTOMY Left 11/20/2019   Procedure: LEFT CRANIOTOMY INTRACRANIAL ANEURYSM CLIPPING;  Surgeon: NuConsuella LoseMD;  [facility] Service: Neurosurgery;  Laterality: Left;  . IR 3D INDEPENDENT WKST  11/20/2019  . IR ANGIO INTRA EXTRACRAN SEL INTERNAL CAROTID BILAT MOD SED  11/20/2019  . IR ANGIO VERTEBRAL SEL VERTEBRAL UNI L MOD SED  11/20/2019  . IR GASTROSTOMY TUBE MOD SED  12/07/2019  . IR [place] VASC ACCESS RIGHT  11/20/2019  . RADIOLOGY WITH ANESTHESIA N/A 11/20/2019   Procedure: IR WITH ANESTHESIA;  Surgeon: NuConsuella LoseMD;  [facility] Service: Radiology;  Laterality:  N/A;     OB History   No obstetric history on file.     No family history on file.  Social History   Tobacco Use  . Smoking status: Unknown If Ever Smoked  Substance Use Topics  . Alcohol use: Not on file  . Drug use: Not on file    Home Medications Prior to Admission medications   Medication Sig Start Date End Date Taking? Authorizing Provider  amLODipine (NORVASC) 10 MG tablet Take 10 mg by mouth daily. 09/25/19  Yes [provider]  apixaban (ELIQUIS) 5 MG TABS tablet Place 1 tablet (5 mg total) into feeding tube 2 (two) times daily. 12/28/19  Yes Costella, ViVista MinkPA-C  hydrALAZINE (APRESOLINE) 50 MG tablet Take 50 mg by mouth 3 (three) times daily. 10/30/19  Yes [provider]  levETIRAcetam (KEPPRA) 100 MG/ML solution Place 5 mLs (500 mg total) into feeding tube 2 (two) times daily. 12/28/19  Yes Costella, ViVista MinkPA-C  linaclotide (LINZESS) 145 MCG CAPS capsule Take 145 mcg by mouth daily before breakfast.   Yes [provider]  metFORMIN (GLUCOPHAGE) 500 MG tablet Take 1 tablet (500 mg total) by mouth 2 (two) times daily. 01/01/20  Yes KnDorie RankMD  Nutritional Supplements (FEEDING SUPPLEMENT, JEVITY 1.2 CAL,) LIQD Place 1,000 mLs into feeding tube continuous. 12/28/19  Yes Costella, ViVista MinkPA-C  omeprazole (PRILOSEC) 20 MG capsule Take 20 mg by mouth daily. 10/30/19  Yes  [provider]  ondansetron (ZOFRAN) 4 MG tablet Take 4 mg by mouth 3 (three) times daily as needed for nausea or vomiting.    Yes [provider]  Pollen Extracts (PROSTAT PO) 30 mLs by Gastric Tube route in the morning, at noon, and at bedtime.   Yes [provider]  hydrOXYzine (ATARAX/VISTARIL) 10 MG tablet Place 10 mg into feeding tube in the morning and at bedtime.    [provider]  insulin lispro (HUMALOG) 100 UNIT/ML injection Inject 2-11 Units into the skin 2 (two) times daily as needed for high blood sugar (Check Sugar BID per  [facility]). Sliding Scale- 0-200= 0 units 201-250= 2 units 251-300= 4 units 301-350= 6units 351-400= 10 units >400 = 11 units    [provider]  LORazepam (ATIVAN) 0.5 MG tablet Take 0.5 mg by mouth once.    [provider]  Nutritional Supplements (FEEDING SUPPLEMENT, PROSOURCE TF,) liquid Place 45 mLs into feeding tube 3 (three) times daily. Patient not taking: Reported on 01/01/2020 12/28/19   Traci Sermon, PA-C    Allergies    Penicillins  Review of Systems   Review of Systems  Unable to perform ROS: Mental status change    Physical Exam Updated Vital Signs BP (!) 97/52   Pulse 78   Temp (!) 94.5 F (34.7 C) (Oral)   Resp 18   LMP  (LMP Unknown)   SpO2 97%   Physical Exam Vitals and nursing note reviewed.  Constitutional:      General: She is not in acute distress.    Appearance: She is well-developed. She is ill-appearing. She is not diaphoretic.  HENT:     Head: Normocephalic and atraumatic.  Eyes:     Conjunctiva/sclera: Conjunctivae normal.  Neck:     Comments: Healing tracheostomy, no drainage or erythema  Cardiovascular:     Rate and Rhythm: Normal rate and regular rhythm.     Heart sounds: Normal heart sounds. No murmur heard.  No friction rub. No gallop.   Pulmonary:     Effort: Pulmonary effort is normal. No respiratory distress.     Breath sounds: Normal breath sounds. No wheezing or rales.  Abdominal:     General: There is no distension.     Palpations: Abdomen is soft.     Tenderness: There is no guarding.  Musculoskeletal:        General: No tenderness.     Cervical back: Normal range of motion.  Skin:    General: Skin is warm and dry.     Findings: No erythema or rash.  Neurological:     GCS: GCS eye subscore is 2. GCS verbal subscore is 2. GCS motor subscore is 4.     Comments: Brief verbalization/localization/eye opening with sternal rub on arrival followed by repeat episode of right sided facial twitching and right  arm shaking followed by GCS 3     ED Results / Procedures / Treatments   Labs (all labs ordered are listed, but only abnormal results are displayed) Labs Reviewed  CBC WITH DIFFERENTIAL/PLATELET - Abnormal; Notable for the following components:      Result Value   WBC 17.9 (*)    RBC 2.90 (*)    Hemoglobin 8.4 (*)    HCT 30.6 (*)    MCV 105.5 (*)    MCHC 27.5 (*)    Platelets 423 (*)    Neutro Abs 16.4 (*)    Lymphs Abs 0.6 (*)  Abs Immature Granulocytes 0.13 (*)    All other components within normal limits  COMPREHENSIVE METABOLIC PANEL - Abnormal; Notable for the following components:   Potassium >7.5 (*)    Glucose, Bld 1,306 (*)    BUN 101 (*)    Creatinine, Ser 3.15 (*)    Calcium 8.7 (*)    Total Protein 6.1 (*)    Albumin 2.5 (*)    GFR calc non Af Amer 14 (*)    GFR calc Af Amer 17 (*)    All other components within normal limits  LACTIC ACID, PLASMA - Abnormal; Notable for the following components:   Lactic Acid, Venous 3.3 (*)    All other components within normal limits  LACTIC ACID, PLASMA - Abnormal; Notable for the following components:   Lactic Acid, Venous 3.4 (*)    All other components within normal limits  URINALYSIS, ROUTINE W REFLEX MICROSCOPIC - Abnormal; Notable for the following components:   Glucose, UA >=500 (*)    All other components within normal limits  BASIC METABOLIC PANEL - Abnormal; Notable for the following components:   Sodium 153 (*)    Chloride 119 (*)    CO2 18 (*)    Glucose, Bld 259 (*)    BUN 91 (*)    Creatinine, Ser 2.80 (*)    GFR calc non Af Amer 16 (*)    GFR calc Af Amer 19 (*)    Anion gap 16 (*)    All other components within normal limits  BASIC METABOLIC PANEL - Abnormal; Notable for the following components:   Sodium 151 (*)    Potassium 5.6 (*)    Chloride 117 (*)    CO2 21 (*)    Glucose, Bld 204 (*)    BUN 86 (*)    Creatinine, Ser 2.72 (*)    Calcium 8.8 (*)    GFR calc non Af Amer 17 (*)    GFR  calc Af Amer 20 (*)    All other components within normal limits  OSMOLALITY - Abnormal; Notable for the following components:   Osmolality 415 (*)    All other components within normal limits  MAGNESIUM - Abnormal; Notable for the following components:   Magnesium 2.7 (*)    All other components within normal limits  PHOSPHORUS - Abnormal; Notable for the following components:   Phosphorus 5.1 (*)    All other components within normal limits  BASIC METABOLIC PANEL - Abnormal; Notable for the following components:   Sodium 153 (*)    Chloride 116 (*)    Glucose, Bld 594 (*)    BUN 95 (*)    Creatinine, Ser 3.05 (*)    GFR calc non Af Amer 15 (*)    GFR calc Af Amer 17 (*)    All other components within normal limits  CBC - Abnormal; Notable for the following components:   WBC 16.4 (*)    RBC 3.17 (*)    Hemoglobin 9.2 (*)    HCT 31.4 (*)    MCHC 29.3 (*)    Platelets 410 (*)    All other components within normal limits  GLUCOSE, CAPILLARY - Abnormal; Notable for the following components:   Glucose-Capillary 518 (*)    All other components within normal limits  CBC - Abnormal; Notable for the following components:   WBC 18.8 (*)    RBC 3.04 (*)    Hemoglobin 9.1 (*)    HCT 30.4 (*)  MCHC 29.9 (*)    All other components within normal limits  LACTIC ACID, PLASMA - Abnormal; Notable for the following components:   Lactic Acid, Venous 6.8 (*)    All other components within normal limits  MAGNESIUM - Abnormal; Notable for the following components:   Magnesium 2.7 (*)    All other components within normal limits  PHOSPHORUS - Abnormal; Notable for the following components:   Phosphorus 4.8 (*)    All other components within normal limits  GLUCOSE, CAPILLARY - Abnormal; Notable for the following components:   Glucose-Capillary 273 (*)    All other components within normal limits  GLUCOSE, CAPILLARY - Abnormal; Notable for the following components:   Glucose-Capillary 264  (*)    All other components within normal limits  GLUCOSE, CAPILLARY - Abnormal; Notable for the following components:   Glucose-Capillary 207 (*)    All other components within normal limits  GLUCOSE, CAPILLARY - Abnormal; Notable for the following components:   Glucose-Capillary 138 (*)    All other components within normal limits  GLUCOSE, CAPILLARY - Abnormal; Notable for the following components:   Glucose-Capillary 138 (*)    All other components within normal limits  GLUCOSE, CAPILLARY - Abnormal; Notable for the following components:   Glucose-Capillary 147 (*)    All other components within normal limits  GLUCOSE, CAPILLARY - Abnormal; Notable for the following components:   Glucose-Capillary 136 (*)    All other components within normal limits  VALPROIC ACID LEVEL - Abnormal; Notable for the following components:   Valproic Acid Lvl 22 (*)    All other components within normal limits  CBG MONITORING, ED - Abnormal; Notable for the following components:   Glucose-Capillary >600 (*)    All other components within normal limits  I-STAT CHEM 8, ED - Abnormal; Notable for the following components:   Potassium 7.6 (*)    BUN 100 (*)    Creatinine, Ser 3.00 (*)    Glucose, Bld >700 (*)    Hemoglobin 10.2 (*)    HCT 30.0 (*)    All other components within normal limits  I-STAT VENOUS BLOOD GAS, ED - Abnormal; Notable for the following components:   pO2, Ven 121.0 (*)    Bicarbonate 28.5 (*)    Potassium 7.6 (*)    HCT 30.0 (*)    Hemoglobin 10.2 (*)    All other components within normal limits  I-STAT ARTERIAL BLOOD GAS, ED - Abnormal; Notable for the following components:   pH, Arterial 7.294 (*)    pCO2 arterial 57.8 (*)    pO2, Arterial 304 (*)    Bicarbonate 28.1 (*)    Potassium 7.5 (*)    HCT 25.0 (*)    Hemoglobin 8.5 (*)    All other components within normal limits  CBG MONITORING, ED - Abnormal; Notable for the following components:   Glucose-Capillary >600  (*)    All other components within normal limits  CBG MONITORING, ED - Abnormal; Notable for the following components:   Glucose-Capillary >600 (*)    All other components within normal limits  CBG MONITORING, ED - Abnormal; Notable for the following components:   Glucose-Capillary >600 (*)    All other components within normal limits  SARS CORONAVIRUS 2 BY RT PCR (HOSPITAL ORDER, [facility])  MRSA PCR SCREENING  URINE CULTURE  CULTURE, BLOOD (ROUTINE X 2)  CULTURE, BLOOD (ROUTINE X 2)  CULTURE, RESPIRATORY  BETA-HYDROXYBUTYRIC ACID  TRIGLYCERIDES  TRIGLYCERIDES  BLOOD GAS, ARTERIAL  CBC WITH DIFFERENTIAL/PLATELET  URINALYSIS, ROUTINE W REFLEX MICROSCOPIC  BASIC METABOLIC PANEL  CBC  MAGNESIUM    EKG None  Radiology EEG  Result Date: 01/01/2020 Lora Havens, MD     12/29/2019  2:04 PM Patient Name: Natia Fahmy Buer MRN: [id number] Epilepsy Attending: Lora Havens Referring Physician/Provider: Dr Karena Addison Aroor Date: 12/31/2019 Duration: 24.51 mins Patient history: 69 y.o. 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  Received 5 mg of Versed in route however started to seize again.  On arrival to [facility], ER patient unresponsive.  CBG greater than 600. EEG to evaluate for seizure Level of alertness:  Comatose AEDs during EEG study: Versed, keppra Technical aspects: This EEG study was done with scalp electrodes positioned according to the 10-20 International system of electrode placement. Electrical activity was acquired at a sampling rate of _0  and reviewed with a high frequency filter of _1  and a low frequency filter of _2 . EEG data were recorded continuously and digitally stored. Description: Four seizures were noted at 1242, 1251, 1259 and 1301, lasting, 25-50 seconds during  which patient was noted to have twitching of mouth and possibly right face twitching which was difficult to see on video. Concomitant eeg showed 5-_3  theta slowing admixed with spikes in left hemisphere, maximal left frontal region which evolved into 2-_4  delta slowing. EEG also showed low amplitude continuous generalized 3-_5  theta-delta slowing. Frequent left hemispheric polyspikes were also noted during which at times patient had brief subtle head jerk.  Hyperventilation and photic stimulation were not performed.   ABNORMALITY - Focal motor seizure, left hemisphere, maximal let frontal - Polyspike, left hemisphere - Continued slow, generalized IMPRESSION: This study showed four focal motor seizures as described above arising from left hemisphere, maximal left frontal region. Frequent brief subtle head jerks were also noted with concomitant left hemispheric polyspikes and were focal motor seizures.Additionally, there is severe diffuse encephalopathy, nonspecific etiology but likely related to sedation, post-ictal state. Dr Aroor was immediately notified. Lora Havens   DG Abd 1 View  Result Date: 01/12/2020 CLINICAL DATA:  Follow-up ileus EXAM: ABDOMEN - 1 VIEW COMPARISON:  11/20/2019 FINDINGS: Gastrostomy catheter is noted in the left upper quadrant. Gastric catheter is seen within the stomach as well. Scattered large and small bowel gas is noted. No obstructive changes are noted. No bony abnormality is seen. IMPRESSION: No acute abnormality noted. Electronically Signed   By: Inez Catalina M.D.   On: 01/08/2020 16:30   CT Head Wo Contrast  Result Date: 01/08/2020 CLINICAL DATA:  Altered mental status. EXAM: CT HEAD WITHOUT CONTRAST TECHNIQUE: Contiguous axial images were obtained from the base of the skull through the vertex without intravenous contrast. COMPARISON:  CT head dated December 08, 2019. FINDINGS: Brain: Expected evolution of the hypodensity involving the left insula, left inferior frontal  lobe, and left anterior temporal lobe, now more CSF density in appearance. Chronic lacunar infarcts in both basal ganglia and old right parietooccipital lobe infarct again noted. Slightly increased cortical hyperdensity in the old right parietooccipital lobe infarct compared to the prior study. Interval removal of the right frontal approach ventricular drain. Previously noted small amount of residual subarachnoid hemorrhage along the left cerebral hemisphere has resolved. Slight interval decrease in size of the low-density extra-axial fluid collection along the left frontal lobe, currently measuring up  to 4 mm, previously 6 mm (by my measurements). Chronic left cerebral convexity subdural hygroma is slightly increased in size, currently measuring up to 10 mm in maximal thickness, previously 7 mm. Unchanged 6 mm chronic right cerebral convexity subdural hygroma. No evidence of acute infarction, hemorrhage, hydrocephalus, or mass lesion/mass effect. Vascular: Left MCA bifurcation aneurysm clip again noted. Atherosclerotic vascular calcification of the carotid siphons. No hyperdense vessel. Skull: Prior left-sided craniotomy. Negative for fracture or focal lesion. Sinuses/Orbits: No acute finding. Unchanged partial opacification of the left greater than right mastoid air cells. Other: Previously noted left frontal scalp subgaleal fluid collection has resolved. IMPRESSION: 1. Slightly increased cortical hyperdensity in the old right parietooccipital lobe infarct when compared to the prior study, which may reflect progressive cortical laminar necrosis, although a small amount of acute hemorrhage is not excluded. Short-term follow-up CT is recommended. 2. Expected evolution of the now more chronic appearing left MCA territory infarct involving the left frontotemporal lobes and insula. 3. Interval removal of the right frontal approach ventricular drain. Previously noted small amount of residual subarachnoid hemorrhage  along the left cerebral hemisphere has resolved. 4. Slight interval decrease in size of the low-density extra-axial fluid collection along the left frontal lobe, currently measuring up to 4 mm, previously 6 mm. 5. Chronic left cerebral convexity subdural hygroma is slightly increased in size, currently measuring up to 10 mm in maximal thickness, previously 7 mm. Unchanged 6 mm chronic right cerebral convexity subdural hygroma. 6. Left frontal scalp subgaleal fluid collection has resolved. Electronically Signed   By: Titus Dubin M.D.   On: 12/15/2019 12:53   DG CHEST PORT 1 VIEW  Result Date: 01/12/2020 CLINICAL DATA:  Line placement. EXAM: PORTABLE CHEST 1 VIEW COMPARISON:  January 04, 2020 FINDINGS: There is a right-sided central venous catheter with tip projecting over the cavoatrial junction. There is no right-sided pneumothorax. The endotracheal tube terminates above the carina by approximately 3.7 cm. The enteric tube extends below the left hemidiaphragm. Persistent bibasilar airspace opacities are noted, greatest in the left lower lobe. There is a probable small left-sided pleural effusion. The heart size is stable. Aortic calcifications are noted. IMPRESSION: 1. Lines and tubes as above.  No pneumothorax. 2. Persistent bibasilar airspace opacities, left greater than right. 3. Probable small left-sided pleural effusion. Electronically Signed   By: Constance Holster M.D.   On: 01/05/2020 19:30   DG Chest Portable 1 View  Result Date: 01/12/2020 CLINICAL DATA:  ET tube reposition. EXAM: PORTABLE CHEST 1 VIEW COMPARISON:  Chest x-ray from same day at 1150 hours. FINDINGS: Interval retraction of the endotracheal tube with the tip now in good position 3.3 cm above the carina. Unchanged enteric tube. Stable cardiomediastinal silhouette. Improved aeration of the left lung with residual left basilar atelectasis and small left pleural effusion. Linear atelectasis at the right lung base. No pneumothorax. No  acute osseous abnormality. IMPRESSION: 1. Appropriately positioned endotracheal tube. 2. Improved aeration of the left lung with residual left basilar atelectasis and small left pleural effusion. Electronically Signed   By: Titus Dubin M.D.   On: 01/03/2020 14:08   DG Chest Portable 1 View  Result Date: 01/13/2020 CLINICAL DATA:  Seizure. Intubation. EXAM: PORTABLE CHEST 1 VIEW COMPARISON:  01/01/2020 FINDINGS: The endotracheal tube is in the right mainstem bronchus approximately 3 cm and should be retracted at least 5-6 cm. The NG tube is coursing down the esophagus into the stomach. Marked hyperinflation of the right lung and marked hypoaeration of the  left lung because of the position of the ET tube. IMPRESSION: 1. Right mainstem bronchus intubation. This should be retracted at least 5-6 cm. 2. Marked hypoaeration of the left lung and hyperinflation of the right lung. These results were called by telephone at the time of interpretation on 12/18/2019 at 12:19 pm to provider [facility] , who verbally acknowledged these results. Electronically Signed   By: Marijo Sanes M.D.   On: 01/06/2020 12:20    Procedures .Critical Care Performed by: Gareth Morgan, MD Authorized by: Gareth Morgan, MD   Critical care provider statement:    Critical care time (minutes):  60   Critical care was time spent personally by me on the following activities:  Discussions with consultants, evaluation of patient's response to treatment, examination of patient, ordering and performing treatments and interventions, ordering and review of laboratory studies, ordering and review of radiographic studies, pulse oximetry, re-evaluation of patient's condition, obtaining history from patient or surrogate and review of old charts Procedure Name: Intubation Date/Time: 01/08/2020 10:50 PM Performed by: Gareth Morgan, MD Pre-anesthesia Checklist: Patient identified, Patient being monitored, Emergency Drugs available,  Timeout performed and Suction available Oxygen Delivery Method: Non-rebreather mask Preoxygenation: Pre-oxygenation with 100% oxygen Induction Type: Rapid sequence Ventilation: Mask ventilation without difficulty Laryngoscope Size: Glidescope Grade View: Grade I Tube size: 7.5 mm Number of attempts: 1 Placement Confirmation: ETT inserted through vocal cords under direct vision,  CO2 detector and Breath sounds checked- equal and bilateral      (including critical care time)  Medications Ordered in ED Medications  insulin regular, human (MYXREDLIN) 100 units/ 100 mL infusion (1.1 Units/hr Intravenous New Bag/Given 12/14/2019 2002)  dextrose 5 %-0.45 % sodium chloride infusion ( Intravenous Rate/Dose Verify 12/24/2019 2000)  dextrose 50 % solution 0-50 mL (has no administration in time range)  pantoprazole (PROTONIX) injection 40 mg (40 mg Intravenous Given 12/31/2019 2101)  lactated ringers infusion ( Intravenous New Bag/Given 12/21/2019 1952)  acetaminophen (TYLENOL) tablet 650 mg (has no administration in time range)  polyethylene glycol (MIRALAX / GLYCOLAX) packet 17 g (has no administration in time range)  ondansetron (ZOFRAN) injection 4 mg (has no administration in time range)  polyethylene glycol (MIRALAX / GLYCOLAX) packet 17 g (17 g Oral Not Given 12/22/2019 1427)  chlorhexidine gluconate (MEDLINE KIT) (PERIDEX) 0.12 % solution 15 mL (15 mLs Mouth Rinse Given 12/30/2019 1944)  MEDLINE mouth rinse (15 mLs Mouth Rinse Given 12/29/2019 2120)  vancomycin variable dose per unstable renal function (pharmacist dosing) (has no administration in time range)  ceFEPIme (MAXIPIME) 2 g in sodium chloride 0.9 % 100 mL IVPB ( Intravenous Stopped 12/31/2019 1725)  propofol (DIPRIVAN) 1000 MG/100ML infusion (80 mcg/kg/min  74.2 kg Intravenous New Bag/Given 01/02/2020 2237)  Chlorhexidine Gluconate Cloth 2 % PADS 6 each (has no administration in time range)  0.9 %  sodium chloride infusion ( Intravenous Stopped 12/14/2019  1655)  heparin injection 5,000 Units (5,000 Units Subcutaneous Given 12/19/2019 2101)  valproate (DEPACON) 500 mg in dextrose 5 % 50 mL IVPB (500 mg Intravenous New Bag/Given 12/31/2019 2105)  0.9 %  sodium chloride infusion (250 mLs Intravenous Not Given 01/02/2020 1702)  norepinephrine (LEVOPHED) 4-5 MG/250ML-% infusion SOLN (4 mg  Not Given 12/18/2019 1626)  0.9 %  sodium chloride infusion (0 mLs Intravenous Hold 01/03/2020 1700)  norepinephrine (LEVOPHED) 4m in 2546mpremix infusion (20 mcg/min Intravenous New Bag/Given 12/17/2019 2006)  levETIRAcetam (KEPPRA) 750 mg in sodium chloride 0.9 % 100 mL IVPB (has no administration in time range)  lacosamide (VIMPAT) 100 mg in sodium chloride 0.9 % 25 mL IVPB (has no administration in time range)  docusate (COLACE) 50 MG/5ML liquid 100 mg (has no administration in time range)  valproate (DEPACON) 2,000 mg in dextrose 5 % 50 mL IVPB (has no administration in time range)  LORazepam (ATIVAN) 2 MG/ML injection (2 mg  Given 12/15/2019 1055)  etomidate (AMIDATE) injection (20 mg Intravenous Given 12/16/2019 1055)  rocuronium (ZEMURON) injection (80 mg Intravenous Given 01/01/2020 1055)  sodium chloride 0.9 % bolus 1,000 mL (0 mLs Intravenous Stopped 12/24/2019 1122)  levETIRAcetam (KEPPRA) 3,500 mg in sodium chloride 0.9 % 250 mL IVPB (0 mg Intravenous Stopped 01/11/2020 1149)  calcium gluconate inj 10% (1 g) URGENT USE ONLY! (1 g Intravenous Given 01/08/2020 1232)  insulin aspart (novoLOG) injection 5 Units (5 Units Intravenous Given 12/26/2019 1233)  fosPHENYtoin (CEREBYX) 1,484 mg PE in sodium chloride 0.9 % 50 mL IVPB (0 mg PE/kg  74.2 kg Intravenous Stopped 12/31/2019 1359)  LORazepam (ATIVAN) 2 MG/ML injection (2 mg  Given 01/03/2020 1349)  sodium chloride 0.9 % bolus 1,000 mL (1,000 mLs Intravenous New Bag/Given 01/01/2020 1423)  vancomycin (VANCOREADY) IVPB 1500 mg/300 mL ( Intravenous Rate/Dose Verify 01/03/2020 2000)  midazolam (VERSED) 2 MG/2ML injection (2 mg  Given 01/01/2020 1552)    lactated ringers bolus 1,000 mL (1,000 mLs Intravenous New Bag/Given 12/16/2019 1606)  midazolam (VERSED) injection 2 mg (2 mg Intravenous Given 12/20/2019 1606)  lactated ringers bolus 1,000 mL ( Intravenous Rate/Dose Change 12/31/2019 1916)  LORazepam (ATIVAN) injection 2 mg (2 mg Intravenous Given 12/20/2019 2047)  lacosamide (VIMPAT) 200 mg in sodium chloride 0.9 % 25 mL IVPB (200 mg Intravenous New Bag/Given 01/10/2020 2127)    ED Course  I have reviewed the triage vital signs and the nursing notes.  Pertinent labs & imaging results that were available during my care of the patient were reviewed by me and considered in my medical decision making (see chart for details).    MDM Rules/Calculators/A&P                          69 year old female with history of recent admission 6/6-7/16 for left-sided subarachnoid hemorrhage with 3 mm midline shift and MCA aneurysm post clipping and tracheostomy placement 6/17 who presents with concern for seizure and hyperglycemia.  Presents in status epilepticus with repeat seizures and no return to baseline.  Intubated for airway protection. ETT pulled back after initial XR.  Labs significant for AKI, hyperglycemia, hyperkalemia.  Ordered Ca, insulin gtt, fluid.  Given ativan and Keppra 3578m as she has 5074mkeppra as more dose for initial load and called Neurology for emergent consult.  Neurology Dr. ArLorraine Laxvaluated her at bedside and CrWhitingonsulted for further care.    Final Clinical Impression(s) / ED Diagnoses Final diagnoses:  Status epilepticus (HCMcIntire Hyperosmolar hyperglycemic state (HHS) (HCBay [place] Acute kidney injury (HCDeW[place] Hyperkalemia    Rx / DC Orders ED Discharge Orders    None       ScGareth MorganMD 01/09/2020 2252

## 2020-01-04 NOTE — Progress Notes (Signed)
EEG complete - results pending 

## 2020-01-04 NOTE — Progress Notes (Signed)
LTM EEG hooked up and running - no initial skin breakdown - push button tested - neuro notified.  Same leads used.  

## 2020-01-05 ENCOUNTER — Inpatient Hospital Stay (HOSPITAL_COMMUNITY): Payer: Medicare Other

## 2020-01-05 DIAGNOSIS — Z9911 Dependence on respirator [ventilator] status: Secondary | ICD-10-CM

## 2020-01-05 DIAGNOSIS — J189 Pneumonia, unspecified organism: Secondary | ICD-10-CM | POA: Diagnosis not present

## 2020-01-05 DIAGNOSIS — J9601 Acute respiratory failure with hypoxia: Secondary | ICD-10-CM | POA: Diagnosis not present

## 2020-01-05 DIAGNOSIS — R569 Unspecified convulsions: Secondary | ICD-10-CM | POA: Diagnosis not present

## 2020-01-05 DIAGNOSIS — R579 Shock, unspecified: Secondary | ICD-10-CM

## 2020-01-05 DIAGNOSIS — G40901 Epilepsy, unspecified, not intractable, with status epilepticus: Secondary | ICD-10-CM

## 2020-01-05 DIAGNOSIS — G40101 Localization-related (focal) (partial) symptomatic epilepsy and epileptic syndromes with simple partial seizures, not intractable, with status epilepticus: Secondary | ICD-10-CM | POA: Diagnosis not present

## 2020-01-05 DIAGNOSIS — A419 Sepsis, unspecified organism: Secondary | ICD-10-CM | POA: Diagnosis not present

## 2020-01-05 DIAGNOSIS — R6521 Severe sepsis with septic shock: Secondary | ICD-10-CM | POA: Diagnosis not present

## 2020-01-05 DIAGNOSIS — J9621 Acute and chronic respiratory failure with hypoxia: Secondary | ICD-10-CM | POA: Diagnosis not present

## 2020-01-05 LAB — POCT I-STAT 7, (LYTES, BLD GAS, ICA,H+H)
Acid-base deficit: 3 mmol/L — ABNORMAL HIGH (ref 0.0–2.0)
Bicarbonate: 22 mmol/L (ref 20.0–28.0)
Calcium, Ion: 1.26 mmol/L (ref 1.15–1.40)
HCT: 20 % — ABNORMAL LOW (ref 36.0–46.0)
Hemoglobin: 6.8 g/dL — CL (ref 12.0–15.0)
O2 Saturation: 93 %
Patient temperature: 98.5
Potassium: 5.5 mmol/L — ABNORMAL HIGH (ref 3.5–5.1)
Sodium: 151 mmol/L — ABNORMAL HIGH (ref 135–145)
TCO2: 23 mmol/L (ref 22–32)
pCO2 arterial: 39.8 mmHg (ref 32.0–48.0)
pH, Arterial: 7.351 (ref 7.350–7.450)
pO2, Arterial: 71 mmHg — ABNORMAL LOW (ref 83.0–108.0)

## 2020-01-05 LAB — GLUCOSE, CAPILLARY
Glucose-Capillary: 117 mg/dL — ABNORMAL HIGH (ref 70–99)
Glucose-Capillary: 125 mg/dL — ABNORMAL HIGH (ref 70–99)
Glucose-Capillary: 127 mg/dL — ABNORMAL HIGH (ref 70–99)
Glucose-Capillary: 128 mg/dL — ABNORMAL HIGH (ref 70–99)
Glucose-Capillary: 142 mg/dL — ABNORMAL HIGH (ref 70–99)
Glucose-Capillary: 143 mg/dL — ABNORMAL HIGH (ref 70–99)
Glucose-Capillary: 147 mg/dL — ABNORMAL HIGH (ref 70–99)
Glucose-Capillary: 152 mg/dL — ABNORMAL HIGH (ref 70–99)
Glucose-Capillary: 156 mg/dL — ABNORMAL HIGH (ref 70–99)
Glucose-Capillary: 164 mg/dL — ABNORMAL HIGH (ref 70–99)
Glucose-Capillary: 175 mg/dL — ABNORMAL HIGH (ref 70–99)
Glucose-Capillary: 194 mg/dL — ABNORMAL HIGH (ref 70–99)

## 2020-01-05 LAB — BASIC METABOLIC PANEL
Anion gap: 12 (ref 5–15)
Anion gap: 11 (ref 5–15)
Anion gap: 11 (ref 5–15)
BUN: 82 mg/dL — ABNORMAL HIGH (ref 8–23)
BUN: 79 mg/dL — ABNORMAL HIGH (ref 8–23)
BUN: 78 mg/dL — ABNORMAL HIGH (ref 8–23)
CO2: 22 mmol/L (ref 22–32)
CO2: 22 mmol/L (ref 22–32)
CO2: 21 mmol/L — ABNORMAL LOW (ref 22–32)
Calcium: 8.8 mg/dL — ABNORMAL LOW (ref 8.9–10.3)
Calcium: 8.5 mg/dL — ABNORMAL LOW (ref 8.9–10.3)
Calcium: 8.5 mg/dL — ABNORMAL LOW (ref 8.9–10.3)
Chloride: 117 mmol/L — ABNORMAL HIGH (ref 98–111)
Chloride: 117 mmol/L — ABNORMAL HIGH (ref 98–111)
Chloride: 117 mmol/L — ABNORMAL HIGH (ref 98–111)
Creatinine, Ser: 2.68 mg/dL — ABNORMAL HIGH (ref 0.44–1.00)
Creatinine, Ser: 2.83 mg/dL — ABNORMAL HIGH (ref 0.44–1.00)
Creatinine, Ser: 2.72 mg/dL — ABNORMAL HIGH (ref 0.44–1.00)
GFR calc Af Amer: 20 mL/min — ABNORMAL LOW (ref >60)
GFR calc Af Amer: 19 mL/min — ABNORMAL LOW (ref >60)
GFR calc Af Amer: 20 mL/min — ABNORMAL LOW (ref >60)
GFR calc non Af Amer: 17 mL/min — ABNORMAL LOW (ref >60)
GFR calc non Af Amer: 16 mL/min — ABNORMAL LOW (ref >60)
GFR calc non Af Amer: 17 mL/min — ABNORMAL LOW (ref >60)
Glucose, Bld: 169 mg/dL — ABNORMAL HIGH (ref 70–99)
Glucose, Bld: 143 mg/dL — ABNORMAL HIGH (ref 70–99)
Glucose, Bld: 148 mg/dL — ABNORMAL HIGH (ref 70–99)
Potassium: 5.8 mmol/L — ABNORMAL HIGH (ref 3.5–5.1)
Potassium: 5.6 mmol/L — ABNORMAL HIGH (ref 3.5–5.1)
Potassium: 5.6 mmol/L — ABNORMAL HIGH (ref 3.5–5.1)
Sodium: 151 mmol/L — ABNORMAL HIGH (ref 135–145)
Sodium: 150 mmol/L — ABNORMAL HIGH (ref 135–145)
Sodium: 149 mmol/L — ABNORMAL HIGH (ref 135–145)

## 2020-01-05 LAB — MAGNESIUM: Magnesium: 1.9 mg/dL (ref 1.7–2.4)

## 2020-01-05 LAB — CBC
HCT: 25.9 % — ABNORMAL LOW (ref 36.0–46.0)
HCT: 25.5 % — ABNORMAL LOW (ref 36.0–46.0)
Hemoglobin: 7.6 g/dL — ABNORMAL LOW (ref 12.0–15.0)
Hemoglobin: 7.4 g/dL — ABNORMAL LOW (ref 12.0–15.0)
MCH: 29.2 pg (ref 26.0–34.0)
MCH: 29.2 pg (ref 26.0–34.0)
MCHC: 29.3 g/dL — ABNORMAL LOW (ref 30.0–36.0)
MCHC: 29 g/dL — ABNORMAL LOW (ref 30.0–36.0)
MCV: 99.6 fL (ref 80.0–100.0)
MCV: 100.8 fL — ABNORMAL HIGH (ref 80.0–100.0)
Platelets: 351 10*3/uL (ref 150–400)
Platelets: 352 10*3/uL (ref 150–400)
RBC: 2.6 MIL/uL — ABNORMAL LOW (ref 3.87–5.11)
RBC: 2.53 MIL/uL — ABNORMAL LOW (ref 3.87–5.11)
RDW: 14.8 % (ref 11.5–15.5)
RDW: 14.7 % (ref 11.5–15.5)
WBC: 18.3 10*3/uL — ABNORMAL HIGH (ref 4.0–10.5)
WBC: 15.2 10*3/uL — ABNORMAL HIGH (ref 4.0–10.5)
nRBC: 0.1 % (ref 0.0–0.2)
nRBC: 0.2 % (ref 0.0–0.2)

## 2020-01-05 LAB — VALPROIC ACID LEVEL: Valproic Acid Lvl: 67 ug/mL (ref 50.0–100.0)

## 2020-01-05 LAB — PHENYTOIN LEVEL, TOTAL: Phenytoin Lvl: 6.4 ug/mL — ABNORMAL LOW (ref 10.0–20.0)

## 2020-01-05 LAB — LACTIC ACID, PLASMA
Lactic Acid, Venous: 3.4 mmol/L (ref 0.5–1.9)
Lactic Acid, Venous: 2.4 mmol/L (ref 0.5–1.9)

## 2020-01-05 LAB — URINE CULTURE: Culture: NO GROWTH

## 2020-01-05 LAB — CK: Total CK: 108 U/L (ref 38–234)

## 2020-01-05 MED ORDER — MIDAZOLAM BOLUS VIA INFUSION
10.0000 mg | Freq: Once | INTRAVENOUS | Status: AC
  Administered 2020-01-05: 10 mg via INTRAVENOUS
  Filled 2020-01-05: qty 10

## 2020-01-05 MED ORDER — SODIUM CHLORIDE 0.9 % IV SOLN: 25.0000 mg/h | INTRAVENOUS | Status: DC

## 2020-01-05 MED ORDER — LORAZEPAM 2 MG/ML IJ SOLN
4.0000 mg | INTRAMUSCULAR | Status: AC
  Administered 2020-01-05: 4 mg via INTRAVENOUS
  Filled 2020-01-05: qty 2

## 2020-01-05 MED ORDER — SODIUM CHLORIDE 0.9 % IV SOLN
200.0000 mg | Freq: Two times a day (BID) | INTRAVENOUS | Status: DC
  Administered 2020-01-05 – 2020-01-06 (×4): 200 mg via INTRAVENOUS
  Filled 2020-01-05 (×6): qty 20

## 2020-01-05 MED ORDER — MIDAZOLAM 50MG/50ML (1MG/ML) PREMIX INFUSION
15.0000 mg/h | INTRAVENOUS | Status: DC
  Administered 2020-01-05: 10 mg/h via INTRAVENOUS
  Filled 2020-01-05: qty 50

## 2020-01-05 MED ORDER — PERAMPANEL 2 MG PO TABS: 4.0000 mg | ORAL_TABLET | Freq: Every day | ORAL | Status: DC

## 2020-01-05 MED ORDER — SODIUM ZIRCONIUM CYCLOSILICATE 5 G PO PACK
10.0000 g | PACK | Freq: Once | ORAL | Status: AC
  Administered 2020-01-05: 10 g
  Filled 2020-01-05: qty 2

## 2020-01-05 MED ORDER — FREE WATER
200.0000 mL | Status: DC
  Administered 2020-01-05 – 2020-01-07 (×12): 200 mL

## 2020-01-05 MED ORDER — PROSOURCE TF PO LIQD
45.0000 mL | Freq: Two times a day (BID) | ORAL | Status: DC
  Administered 2020-01-05: 45 mL
  Filled 2020-01-05: qty 45

## 2020-01-05 MED ORDER — PERAMPANEL 2 MG PO TABS
4.0000 mg | ORAL_TABLET | Freq: Every day | ORAL | Status: DC
  Administered 2020-01-06: 4 mg
  Filled 2020-01-05: qty 2

## 2020-01-05 MED ORDER — PERAMPANEL 8 MG PO TABS
8.0000 mg | ORAL_TABLET | Freq: Once | ORAL | Status: AC
  Administered 2020-01-05: 8 mg via ORAL
  Filled 2020-01-05: qty 1

## 2020-01-05 MED ORDER — PROSOURCE TF PO LIQD
90.0000 mL | Freq: Three times a day (TID) | ORAL | Status: DC
  Administered 2020-01-05 – 2020-01-06 (×5): 90 mL
  Filled 2020-01-05 (×5): qty 90

## 2020-01-05 MED ORDER — SODIUM CHLORIDE 0.9% FLUSH: 10.0000 mL | INTRAVENOUS | Status: DC | PRN

## 2020-01-05 MED ORDER — INSULIN ASPART 100 UNIT/ML ~~LOC~~ SOLN
1.0000 [IU] | SUBCUTANEOUS | Status: DC
  Administered 2020-01-05 (×2): 2 [IU] via SUBCUTANEOUS
  Administered 2020-01-05 – 2020-01-06 (×6): 1 [IU] via SUBCUTANEOUS
  Administered 2020-01-07 (×2): 2 [IU] via SUBCUTANEOUS

## 2020-01-05 MED ORDER — POLYETHYLENE GLYCOL 3350 17 G PO PACK
17.0000 g | PACK | Freq: Every day | ORAL | Status: DC
  Administered 2020-01-05 – 2020-01-06 (×2): 17 g
  Filled 2020-01-05 (×2): qty 1

## 2020-01-05 MED ORDER — VITAL HIGH PROTEIN PO LIQD
1000.0000 mL | ORAL | Status: DC
  Administered 2020-01-06: 1000 mL

## 2020-01-05 MED ORDER — MIDAZOLAM 50MG/50ML (1MG/ML) PREMIX INFUSION: 25.0000 mg/h | INTRAVENOUS | Status: DC

## 2020-01-05 MED ORDER — SODIUM CHLORIDE 0.9% FLUSH
10.0000 mL | Freq: Two times a day (BID) | INTRAVENOUS | Status: DC
  Administered 2020-01-05 – 2020-01-06 (×5): 10 mL

## 2020-01-05 MED ORDER — INSULIN DETEMIR 100 UNIT/ML ~~LOC~~ SOLN
5.0000 [IU] | Freq: Two times a day (BID) | SUBCUTANEOUS | Status: DC
  Administered 2020-01-05 – 2020-01-06 (×4): 5 [IU] via SUBCUTANEOUS
  Filled 2020-01-05 (×8): qty 0.05

## 2020-01-05 MED ORDER — SODIUM CHLORIDE 0.9 % IV SOLN
25.0000 mg/h | INTRAVENOUS | Status: DC
  Administered 2020-01-05: 25 mg/h via INTRAVENOUS
  Administered 2020-01-05: 15 mg/h via INTRAVENOUS
  Filled 2020-01-05 (×4): qty 20

## 2020-01-05 MED ORDER — PANTOPRAZOLE SODIUM 40 MG PO PACK
40.0000 mg | PACK | Freq: Every day | ORAL | Status: DC
  Administered 2020-01-05 – 2020-01-06 (×2): 40 mg
  Filled 2020-01-05 (×3): qty 20

## 2020-01-05 MED ORDER — SODIUM CHLORIDE 0.9 % IV SOLN
10.0000 mg/h | INTRAVENOUS | Status: DC
  Administered 2020-01-05: 20 mg/h via INTRAVENOUS
  Administered 2020-01-05: 25 mg/h via INTRAVENOUS
  Administered 2020-01-07: 10 mg/h via INTRAVENOUS
  Filled 2020-01-05 (×3): qty 50

## 2020-01-05 MED ORDER — VITAL HIGH PROTEIN PO LIQD
1000.0000 mL | ORAL | Status: DC
  Administered 2020-01-05: 1000 mL

## 2020-01-05 NOTE — Progress Notes (Signed)
RT NOTE: Pt transported from 60M 5 to CT and back without any complications.

## 2020-01-05 NOTE — Progress Notes (Signed)
Patient continues to have seizures despite aggressive medical therapy. She is currently on keppra(renally dosed), lacosamide, valproate, perampanel, propofol and midazolam.   She was initially in a burst suppression pattern, but seizures continued despite this pattern. She had propofol and versed increased and now has bursts that are solely epileptic in appearance, but the seizures have been under better(though incomplete) control.   She received a single dose of 8mg  perampanel, and I think that AMPA activity would be desirable. The loading dose of perampanel is poorly defined, but a repeat 8mg  dose I think would be reasonable at this time. Recheck post load depakote level.   Roland Rack, MD [facility] [phone]  If 7pm- 7am, please page neurology on call as listed in [place].

## 2020-01-05 NOTE — TOC Initial Note (Signed)
Transition of Care [facility]) - Initial/Assessment Note    Patient Details  Name: Rebecca Green MRN: [id number] Date of Birth: [birth year]  [facility] CM/SW Contact:    Verdell Carmine, RN Phone Number: 01/05/2020, 9:30 AM  Clinical Narrative:                 Patient admitted with status epilepticus HHNK. ICH, melena. H&H down today, 6/20 may need blood products. Patient is critically ill, if she recovers will need SNF placement. She was living at [facility] previously.   Expected Discharge Plan: Skilled Nursing Facility Barriers to Discharge: Continued Medical Work up   Patient Goals and CMS Choice        Expected Discharge Plan and Services Expected Discharge Plan: [place] In-house Referral: Clinical Social Work     Living arrangements for the past 2 months: [place]                                      Prior Living Arrangements/Services Living arrangements for the past 2 months: [place] Lives with:: Facility Resident   Do you feel safe going back to the place where you live?:  (UTA)      Need for Family Participation in Patient Care: Yes (Comment) Care giver support system in place?: Yes (comment)   Criminal Activity/Legal Involvement Pertinent to Current Situation/Hospitalization: No - Comment as needed  Activities of Daily Living      Permission Sought/Granted      Share Information with NAME: [place] granted to share info w Relationship: daughter  Permission granted to share info w Contact Information: [phone]  Emotional Assessment   Attitude/Demeanor/Rapport: Unable to Assess Affect (typically observed): Unable to Assess   Alcohol / Substance Use: Not Applicable Psych Involvement: No (comment)  Admission diagnosis:  Status epilepticus ([place]) [G40.901] Ileus of unspecified type [facility]) [K56.7] Patient Active Problem List   Diagnosis Date Noted  . Status  epilepticus ([place]) 01/02/2020  . Endotracheal tube present   . AKI (acute kidney injury) ([place])   . SAH (subarachnoid hemorrhage) ([place])   . Status post tracheostomy ([place])   . Acute respiratory failure ([place])   . Aneurysmal subarachnoid hemorrhage ([place]) 11/19/2019   PCP:  Lowella Dandy, NP Pharmacy:   [facility] [place], [address] AT [place] [address] [place] [address] Phone: [phone] Fax: [phone]     Social Determinants of Health (SDOH) Interventions    Readmission Risk Interventions No flowsheet data found.

## 2020-01-05 NOTE — Progress Notes (Signed)
Wasted 60 mL of versed in steri cycle, witnessed by Neta Mends, RN.

## 2020-01-05 NOTE — Progress Notes (Signed)
EEG shows a persistent suppression pattern. Unclear if there was a cerebral injury or another cause that could potentially cause this.  Neuro exam today with: Mental status: No response to voice, loud clap, noxious stimuli Pupils 86mm BL equal round and reactive. Corneals: Absent Occulocephalics: Negative. Cough: Absent Gag: Absent  Recs: - I ordered STAT CTH to look for any large structural abnormality.   [place] Pager Number [phone]

## 2020-01-05 NOTE — Progress Notes (Signed)
LTM EEG Headbox reconnected by nursing staff and running well .   

## 2020-01-05 NOTE — Plan of Care (Signed)

## 2020-01-05 NOTE — Progress Notes (Signed)
eLink Physician-Brief Progress Note Patient Name: Rebecca Green DOB: 19-Dec-1950 MRN: [id number]   Date of Service  01/05/2020  HPI/Events of Note  Patient needs to come off insulin infusion.  eICU Interventions  Hyperglycemia transition orders entered.        Kerry Kass [name] 01/05/2020, 3:55 AM

## 2020-01-05 NOTE — Care Plan (Addendum)
Updated daughter Audelia Acton via phone. Istat Hb <7l will verify with lab check. Daughter consented over the phone for blood transfusions.  Julian Hy, DO 01/05/20 10:48 AM [facility]

## 2020-01-05 NOTE — Procedures (Addendum)
Patient Name: Rebecca Green  MRN: [id number]  Epilepsy Attending: Lora Havens  Referring Physician/Provider: Dr Karena Addison Aroor Duration: 12/19/2019 1303 to 01/05/2020 1303  Patient history: 69 y.o.femalewith past medical history of subarachnoid hemorrhage status post left MCA aneurysm clipping, tracheostomy admitted from 6/6-7/16 presents to the emergency department right sided face arm and head twitching. Received 5 mg of Versed in route however started to seize again. On [facility] to [facility], ER patient unresponsive. CBG greater than 600. EEG to evaluate for seizure  Level of alertness:  Comatose  AEDs during EEG study: Versed, propofol, keppra, VPA, perampanel, vimpat  Technical aspects: This EEG study was done with scalp electrodes positioned according to the 10-20 International system of electrode placement. Electrical activity was acquired at a sampling rate of 500Hz  and reviewed with a high frequency filter of 70Hz  and a low frequency filter of 1Hz . EEG data were recorded continuously and digitally stored.   Description: EEG intially showed frequent seizures arising from left hemisphere, maximal left frontal region during which patient was noted to have right facial twitching. There were also brief head jerks with a burst of left hemispheric spikes consistent with focal motor seizures. As sedation was titrated up, eeg showed bust suppression pattern with highly epileptiform bursts in left hemipshere. At times with bursts, patient continues to have clinical seizure with head jerk. Hyperventilation and photic stimulation were not performed.     ABNORMALITY - Focal convulsive status epilepticus, left hemisphere - Burst suppression with highly epileptiform bursts in left hemisphere  IMPRESSION: This study showed focal convulsive status epilepticus arising from left hemisphere. As sedation was titrated up, clinical seizures became less frequent but eeg continues to show highly  epileptiform bursts in left hemisphere suggestive of underlying structural abnormality.  Additionally, there is profound diffuse encephalopathy, nonspecific etiology but likely related to sedation, sezures.    Rebecca Green [name]

## 2020-01-05 NOTE — Progress Notes (Signed)
LTM maint complete - no skin breakdown under: A2 A1 F7 T7 A1 Fp1

## 2020-01-05 NOTE — Plan of Care (Addendum)
CT head with new L MCA watershed infarcts. Will discuss with neurology. Con't vasopressors with goal MAP >65 for now.  Julian Hy, DO 01/05/20 7:32 PM [facility]  D/w neurology- MAP goal 75. Bedside RN updated.  Julian Hy, DO 01/05/20 8:30 PM [facility]

## 2020-01-05 NOTE — Progress Notes (Signed)
Continued seizure activity after further interventions. Dr. Leonel Ramsay notified. Will continue to monitor closely.

## 2020-01-05 NOTE — Progress Notes (Signed)
NAME:  [place], MRN:  [id number], DOB:  [birth year], LOS: 1 ADMISSION DATE:  12/21/2019, CONSULTATION DATE: 12/31/2019 REFERRING MD:  ED CHIEF COMPLAINT: Seizures  Brief History   69 yo female with recent admission in HTN, June for [facility] s/p crani and clipping admitted for seizures in setting of hyperglycemia  History of present illness    Patient recently evaluated in the ED on 7/19 for accidental tracheostomy removal.  It was felt at that time she was safe to have tracheostomy remain out.  Incidentally, labs were notable for hyperglycemia. She was started on Metformin with instructions to follow up with PCP.  Today, patient presents to ED from SNF with seizures.  She received 5 mg versed IM with cessation of seizure activity.  Subsequently had recurrent seizures, Given additional 5 mg IV versed. Noted to be unresponsive.  SpO2 noted to be in the 70's. Ultimately required intubation.   She received 3.5 gm of Keppra in the ED and Neurology consulted for further evaluation. EEG in the ED reads focal motor seizure of the left hemisphere, maximal left frontal. Additional Fosphenytoin given by Neurology.   Past Medical History  Seizure Hypertension SAH s/p crani with clipping Tracheostomy 6/17, decannulated 7/19  Significant Hospital Events   Admit 7/22  Consults:  PCCM 7/22 Neuro 7/22  Procedures:  CVC 7/22>  Significant Diagnostic Tests:  [facility] 7/22: chronic appearing left MCA infarct, low density fluid collection left frontal lobe 4 mm (previously 6 mm).  Chronic left subdural hygroma 7/22 EEG> focal motor seizure of the left hemisphere, especially left frontal, slow generalized background activity cEEG>  Micro Data:  COVID Negative 7/22 Blood Cx 7/22 >> Respiratory Cx 7/22 >>  Antimicrobials:  Vanc 7/22 Cefepime 7/22  Interim history/subjective:  Seizures overnight despite Keppra, lacosamide, valproate, propofol and midazolam drips, and perampanel -loaded with additional  dose of perampanel.  Objective   Blood pressure (!) 107/55, pulse 68, temperature 98.6 F (37 C), temperature source Axillary, resp. rate 18, weight 77.4 kg, SpO2 96 %.    Vent Mode: PRVC FiO2 (%):  [40 %-100 %] 40 % Set Rate:  [18 bmp] 18 bmp Vt Set:  [470 mL] 470 mL PEEP:  [5 cmH20] 5 cmH20 Plateau Pressure:  [16 cmH20-25 cmH20] 17 cmH20   Intake/Output Summary (Last 24 hours) at 01/05/2020 0160 Last data filed at 01/05/2020 0600 Gross per 24 hour  Intake 6607.52 ml  Output 370 ml  Net 6237.52 ml   Filed Weights   01/05/20 0500  Weight: 77.4 kg    Examination: General: Critically ill-appearing woman laying in bed intubated, heavily sedated HENT: [place]/AT, eyes anicteric, ETT in place Neck: Nearly closed trach site Lungs: Scattered rhonchi, copious thin yellow secretions from ETT.  No tube feeds running.  Occasionally breathing above the vent. Cardiovascular: Regular rate and rhythm, no murmurs Abdomen: Soft, nontender, nondistended.  PEG without surrounding erythema. Extremities: Pedal edema, ulcers with reducible extension contractures. Neuro: RASS -5, minimally reactive pupils.  Periodically breathing above the vent.   CXR personally reviewed-left basilar infiltrate, patchy infiltrates bilaterally.  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 CTH without acute intracranial process: Known hygroma and left frontal fluid collection.  --Neurology following; continue EEG monitoring --EEG in left frontal hemisphere focal seizures --Etiology: Unclear infection vs DKA vs fluid collection --AED's per Neuro: Keppra, Fosphenytoin --Neuro considering addition of Propofol.   Acute hypoxic respiratory failure requiring mechanical ventilation  Concern for aspiration pneumonia. -Respiratory culture still needs to be drawn. -Continue low tidal volume ventilation, 4 to 8 cc/kg ideal body weight.  Plateau at goal.   Titrate down FiO2 as able to wean SPO2 greater than 90%. --Tracheostomy decannulated 7/19 --Continue empiric abx: Vanc/Cefepime. --VAP prevention protocol -No SATs until stable neurologically  Shock, undifferentiated--medication induced vs sepsis --Continue to monitor cultures and empiric antibiotics --Vasopressors as needed to maintain MAP greater than 65  DM, Type II, uncontrolled.  Hyperglycemic but no DKA at presentation.. Hgb A1c 7.8. Recently started on Metformin for hyperglycemia. --Hold PTA Metformin --Insulin drip  Hypernatremia -Free water enterally  AKI; baseline Cr ~ 1.2, likely due to sepsis Hyperkalemia -Lokelma -EKG & IV calcium PRN -Strict I's/O -Renally dose medications -MAP goal > 65  Lactic acidosis due to seizures, likely also sepsis --Repeat lactic acid this morning  Chronic anemia -Continue to monitor daily -Transfuse for hemoglobin less than 7 or hemodynamically significant bleeding  H/o HTN --Hold home amlodipine, hydralazine  Recent SAH s/p crani and clipping 11/2019 --Baseline neurological exam: Looks around, was able to state family members name. Moves all extremities. Able to stand with assistance   Recent LUE DVT --On eliquis as outpatient; would not restart DOAC at this time in case she needs repeat tracheostomy --Hold systemic a/c for now, start DVT ppx   Best practice:  Diet: NPO, NGT to LIWS Pain/Anxiety/Delirium protocol (if indicated): Per Neurology VAP protocol (if indicated): VAP Bundle DVT prophylaxis: Heparin subq GI prophylaxis: PPI Glucose control: basal bolus insulin Mobility: Bedrest.  Code Status: FULL Family Communication: Daughter: Audelia Acton Stipp- left message Disposition:  ICU  Labs   CBC: Recent Labs  Lab 01/01/20 1003 01/01/20 1003 12/14/2019 1120 01/06/2020 1135 01/03/2020 1136 12/26/2019 1137 12/23/2019 1602 12/24/2019 1753 01/05/20 0227  WBC 9.8  --  17.9*  --   --   --  16.4* 18.8* 18.3*  NEUTROABS 7.4  --   16.4*  --   --   --   --   --   --   HGB 9.9*   < > 8.4*   < > 10.2* 8.5* 9.2* 9.1* 7.6*  HCT 32.9*   < > 30.6*   < > 30.0* 25.0* 31.4* 30.4* 25.9*  MCV 96.5  --  105.5*  --   --   --  99.1 100.0 99.6  PLT 459*  --  423*  --   --   --  410* 398 351   < > = values in this interval not displayed.    Basic Metabolic Panel: Recent Labs  Lab 12/28/2019 1120 12/20/2019 1120 01/06/2020 1135 01/03/2020 1136 01/09/2020 1137 12/23/2019 1426 01/03/2020 1602 12/17/2019 1753 01/08/2020 2121 01/05/20 0227  NA 141   < > 143   < > 143  --  153* 153* 151* 151*  K >7.5*   < > 7.6*   < > 7.5*  --  4.7 5.1 5.6* 5.8*  CL 108   < > 105  --   --   --  116* 119* 117* 117*  CO2 22  --   --   --   --   --  24 18* 21* 22  GLUCOSE 1,306*   < > >700*  --   --   --  594* 259* 204* 169*  BUN 101*   < > 100*  --   --   --  95* 91* 86* 82*  CREATININE 3.15*   < > 3.00*  --   --   --  3.05* 2.80* 2.72* 2.68*  CALCIUM 8.7*  --   --   --   --   --  9.6 9.4 8.8* 8.8*  MG  --   --   --   --   --  2.7* 2.7*  --   --  1.9  PHOS  --   --   --   --   --  5.1* 4.8*  --   --   --    < > = values in this interval not displayed.   GFR: Estimated Creatinine Clearance: 20.5 mL/min (A) (by C-G formula based on SCr of 2.68 mg/dL (H)). Recent Labs  Lab 12/23/2019 1120 01/08/2020 1303 12/19/2019 1602 12/28/2019 1753 01/05/20 0227  WBC 17.9*  --  16.4* 18.8* 18.3*  LATICACIDVEN 3.3* 3.4*  --  6.8*  --     Liver Function Tests: Recent Labs  Lab 12/25/2019 1120  AST 15  ALT 19  ALKPHOS 94  BILITOT 0.8  PROT 6.1*  ALBUMIN 2.5*   No results for input(s): LIPASE, AMYLASE in the last 168 hours. No results for input(s): AMMONIA in the last 168 hours.  ABG    Component Value Date/Time   PHART 7.294 (L) 12/22/2019 1137   PCO2ART 57.8 (H) 12/17/2019 1137   PO2ART 304 (H) 12/21/2019 1137   HCO3 28.1 (H) 12/23/2019 1137   TCO2 30 12/19/2019 1137   ACIDBASEDEF 2.0 12/05/2019 0300   O2SAT 100.0 12/22/2019 1137     Coagulation Profile: No  results for input(s): INR, PROTIME in the last 168 hours.  Cardiac Enzymes: No results for input(s): CKTOTAL, CKMB, CKMBINDEX, TROPONINI in the last 168 hours.  HbA1C: Hgb A1c MFr Bld  Date/Time Value Ref Range Status  11/21/2019 10:55 AM 7.8 (H) 4.8 - 5.6 % Final    Comment:    (NOTE) Pre diabetes:          5.7%-6.4% Diabetes:              >6.4% Glycemic control for   <7.0% adults with diabetes     CBG: Recent Labs  Lab 01/05/20 0151 01/05/20 0305 01/05/20 0504 01/05/20 0609 01/05/20 0655  GLUCAP 156* 147* 125* 142* 143*     This patient is critically ill with multiple organ system failure which requires frequent high complexity decision making, assessment, support, evaluation, and titration of therapies. This was completed through the application of advanced monitoring technologies and extensive interpretation of multiple databases. During this encounter critical care time was devoted to patient care services described in this note for 40 minutes.  Julian Hy, DO 01/05/20 8:11 AM  [facility]

## 2020-01-06 DIAGNOSIS — J9621 Acute and chronic respiratory failure with hypoxia: Secondary | ICD-10-CM | POA: Diagnosis not present

## 2020-01-06 DIAGNOSIS — G40101 Localization-related (focal) (partial) symptomatic epilepsy and epileptic syndromes with simple partial seizures, not intractable, with status epilepticus: Secondary | ICD-10-CM | POA: Diagnosis not present

## 2020-01-06 DIAGNOSIS — R6521 Severe sepsis with septic shock: Secondary | ICD-10-CM | POA: Diagnosis not present

## 2020-01-06 DIAGNOSIS — R569 Unspecified convulsions: Secondary | ICD-10-CM | POA: Diagnosis not present

## 2020-01-06 DIAGNOSIS — A419 Sepsis, unspecified organism: Secondary | ICD-10-CM | POA: Diagnosis not present

## 2020-01-06 LAB — CBC
HCT: 25.1 % — ABNORMAL LOW (ref 36.0–46.0)
Hemoglobin: 7.3 g/dL — ABNORMAL LOW (ref 12.0–15.0)
MCH: 29.9 pg (ref 26.0–34.0)
MCHC: 29.1 g/dL — ABNORMAL LOW (ref 30.0–36.0)
MCV: 102.9 fL — ABNORMAL HIGH (ref 80.0–100.0)
Platelets: 309 10*3/uL (ref 150–400)
RBC: 2.44 MIL/uL — ABNORMAL LOW (ref 3.87–5.11)
RDW: 14.9 % (ref 11.5–15.5)
WBC: 11.1 10*3/uL — ABNORMAL HIGH (ref 4.0–10.5)
nRBC: 0.6 % — ABNORMAL HIGH (ref 0.0–0.2)

## 2020-01-06 LAB — COMPREHENSIVE METABOLIC PANEL
ALT: 15 U/L (ref 0–44)
AST: 14 U/L — ABNORMAL LOW (ref 15–41)
Albumin: 1.8 g/dL — ABNORMAL LOW (ref 3.5–5.0)
Alkaline Phosphatase: 76 U/L (ref 38–126)
Anion gap: 11 (ref 5–15)
BUN: 84 mg/dL — ABNORMAL HIGH (ref 8–23)
CO2: 20 mmol/L — ABNORMAL LOW (ref 22–32)
Calcium: 8.1 mg/dL — ABNORMAL LOW (ref 8.9–10.3)
Chloride: 115 mmol/L — ABNORMAL HIGH (ref 98–111)
Creatinine, Ser: 2.56 mg/dL — ABNORMAL HIGH (ref 0.44–1.00)
GFR calc Af Amer: 21 mL/min — ABNORMAL LOW (ref >60)
GFR calc non Af Amer: 18 mL/min — ABNORMAL LOW (ref >60)
Glucose, Bld: 148 mg/dL — ABNORMAL HIGH (ref 70–99)
Potassium: 5.3 mmol/L — ABNORMAL HIGH (ref 3.5–5.1)
Sodium: 146 mmol/L — ABNORMAL HIGH (ref 135–145)
Total Bilirubin: 0.6 mg/dL (ref 0.3–1.2)
Total Protein: 5.2 g/dL — ABNORMAL LOW (ref 6.5–8.1)

## 2020-01-06 LAB — GLUCOSE, CAPILLARY
Glucose-Capillary: 102 mg/dL — ABNORMAL HIGH (ref 70–99)
Glucose-Capillary: 124 mg/dL — ABNORMAL HIGH (ref 70–99)
Glucose-Capillary: 141 mg/dL — ABNORMAL HIGH (ref 70–99)
Glucose-Capillary: 146 mg/dL — ABNORMAL HIGH (ref 70–99)
Glucose-Capillary: 187 mg/dL — ABNORMAL HIGH (ref 70–99)

## 2020-01-06 LAB — PHOSPHORUS: Phosphorus: 5.6 mg/dL — ABNORMAL HIGH (ref 2.5–4.6)

## 2020-01-06 LAB — VANCOMYCIN, RANDOM: Vancomycin Rm: 12

## 2020-01-06 LAB — TRIGLYCERIDES: Triglycerides: 421 mg/dL — ABNORMAL HIGH (ref <150)

## 2020-01-06 LAB — MAGNESIUM: Magnesium: 1.8 mg/dL (ref 1.7–2.4)

## 2020-01-06 MED ORDER — VANCOMYCIN HCL IN DEXTROSE 1-5 GM/200ML-% IV SOLN
1000.0000 mg | Freq: Once | INTRAVENOUS | Status: AC
  Administered 2020-01-06: 1000 mg via INTRAVENOUS
  Filled 2020-01-06: qty 200

## 2020-01-06 MED ORDER — NOREPINEPHRINE 16 MG/250ML-% IV SOLN
0.0000 ug/min | INTRAVENOUS | Status: DC
  Administered 2020-01-06 (×2): 30 ug/min via INTRAVENOUS
  Administered 2020-01-07: 40 ug/min via INTRAVENOUS
  Filled 2020-01-06 (×3): qty 250

## 2020-01-06 MED ORDER — SODIUM CHLORIDE 0.9 % IV SOLN
2.5000 mg/kg/h | INTRAVENOUS | Status: DC
  Administered 2020-01-06: 4 mg/kg/h via INTRAVENOUS
  Filled 2020-01-06 (×2): qty 25

## 2020-01-06 MED ORDER — SODIUM CHLORIDE 0.9 % IV SOLN: INTRAVENOUS | Status: DC | PRN

## 2020-01-06 MED ORDER — PIPERACILLIN-TAZOBACTAM 3.375 G IVPB
3.3750 g | Freq: Three times a day (TID) | INTRAVENOUS | Status: DC
  Administered 2020-01-06 – 2020-01-07 (×3): 3.375 g via INTRAVENOUS
  Filled 2020-01-06 (×7): qty 50

## 2020-01-06 MED ORDER — KETAMINE BOLUS VIA INFUSION
1.5000 mg/kg | Freq: Once | INTRAVENOUS | Status: AC
  Administered 2020-01-06: 127.05 mg via INTRAVENOUS
  Filled 2020-01-06: qty 130

## 2020-01-06 MED ORDER — SODIUM CHLORIDE 0.9 % IV SOLN
4.0000 mg/kg/h | INTRAVENOUS | Status: DC
  Administered 2020-01-06 (×2): 1.2 mg/kg/h via INTRAVENOUS
  Filled 2020-01-06 (×3): qty 5

## 2020-01-06 NOTE — Procedures (Addendum)
Patient Name:Rebecca Green IRW:[id number] Epilepsy Attending:Geraldo Haris [name] Referring Physician/Provider:Dr Karena Addison Aroor Duration:01/05/2020 1303 to 01/06/2020 1303  Patient history:68 y.o.femalewith past medical history of subarachnoid hemorrhage status post left MCA aneurysm clipping, tracheostomy admitted from 6/6-7/16 presents to the emergency department right sided face arm and head twitching. Received 5 mg of Versed in route however started to seize again. On arrival to [facility], ER patient unresponsive. CBG greater than 600.EEG to evaluate for seizure  Level of alertness:Comatose  AEDs during EEG study:Versed, propofol, keppra, VPA, perampanel, vimpat  Technical aspects: This EEG study was done with scalp electrodes positioned according to the 10-20 International system of electrode placement. Electrical activity was acquired at a sampling rate of 500Hz  and reviewed with a high frequency filter of 70Hz  and a low frequency filter of 1Hz . EEG data were recorded continuously and digitally stored.   Description:EEG intially showed bust suppression pattern with highly epileptiform bursts in left hemipshere which gradually evolved into generalized suppression with frequent left hemispheric epileptiform polyspikes. AS eg continued, predominantly eeg suppression was noted with 1-3 subclinical seizures arising from left hemisphere, lasting 1.5-2 minutes.   ABNORMALITY - Seizure with no clinical signs, left hemisphere - EEG suppression, generalized  IMPRESSION: This studyshowed 1-3 seizures without clinical signs arising from left hemisphere, lasting 1.5-2 minutes as well as profound diffuse encephalopathy likely related tounderlying structural abnormality, sedation and seizures.   [name] [name]

## 2020-01-06 NOTE — Progress Notes (Signed)
Pharmacy Antibiotic Note  Rebecca Green is a 69 y.o. female admitted on 12/29/2019 with sepsis, possibly from PNA.  Pharmacy has been consulted for pip/tazo dosing. Per MD, switching from cefepime to pip/tazo for less effect on seizure threshold.   Renal function appears to be improving, will follow. WBC down. Afebrile. Patient with hx of rash to PCN. Has tolerated cefepime but not received penicillins here before. Asked RN to monitor for rash.   Plan: Start pip/tazo 3.375g Q 8hr (EI) Monitor cultures, clinical status, renal fx Narrow abx as able and f/u duration   Weight: 84.7 kg (186 lb 11.7 oz)  Temp (24hrs), Avg:99 F (37.2 C), Min:97.6 F (36.4 C), Max:100.2 F (37.9 C)  Recent Labs  Lab 12/15/2019 1120 12/26/2019 1120 12/20/2019 1135 12/28/2019 1303 12/28/2019 1602 01/03/2020 1602 01/01/2020 1753 12/22/2019 1753 12/27/2019 2121 01/05/20 0227 01/05/20 0819 01/05/20 1053 01/05/20 1118 01/05/20 1600 01/06/20 0437 01/06/20 0805  WBC 17.9*   < >  --   --  16.4*  --  18.8*  --   --  18.3*  --  15.2*  --   --   --  11.1*  CREATININE 3.15*   < >   < >  --  3.05*   < > 2.80*   < > 2.72* 2.68* 2.83*  --   --  2.72*  --  2.56*  LATICACIDVEN 3.3*  --   --  3.4*  --   --  6.8*  --   --   --  3.4*  --  2.4*  --   --   --   VANCORANDOM  --   --   --   --   --   --   --   --   --   --   --   --   --   --  12  --    < > = values in this interval not displayed.    Estimated Creatinine Clearance: 22.4 mL/min (A) (by C-G formula based on SCr of 2.56 mg/dL (H)).    Allergies  Allergen Reactions  . Penicillins Rash    Antimicrobials this admission: Vanc 7/22 >>  Cefe 7/22 >>    Microbiology results: 7/23 BCx: ngtd 7/23  Sputum: Gram variable Rod, few GPC   7/22 BCx ngtd 7/22 MRSA PCR: neg  Thank you for allowing pharmacy to be a part of this patient's care.  Benetta Spar, PharmD, BCPS, BCCP Clinical Pharmacist  Please check AMION for all [place] phone numbers After 10:00 PM, call  [place] [phone]

## 2020-01-06 NOTE — Progress Notes (Addendum)
Progress note:  Subjective: Continues EEG with mostly suppressed background with epileptogenic discharges. We obtained a CT head which demonstrated a new evolving left MCA stroke.  I discussed with patient's daughter and son-in-law over phone about her difficult to control seizures, left sided stroke.  Past History Past Medical History:  Diagnosis Date  . CVA (cerebral vascular accident) ([place])   . HTN (hypertension)   . Seizure [facility])    Past Surgical History:  Procedure Laterality Date  . CRANIOTOMY Left 11/20/2019   Procedure: LEFT CRANIOTOMY INTRACRANIAL ANEURYSM CLIPPING;  Surgeon: [name], MD;  Location: [facility];  Service: Neurosurgery;  Laterality: Left;  . IR 3D INDEPENDENT WKST  11/20/2019  . IR ANGIO INTRA EXTRACRAN SEL INTERNAL CAROTID BILAT MOD SED  11/20/2019  . IR ANGIO VERTEBRAL SEL VERTEBRAL UNI L MOD SED  11/20/2019  . IR GASTROSTOMY TUBE MOD SED  12/07/2019  . IR US GUIDE VASC ACCESS RIGHT  11/20/2019  . RADIOLOGY WITH ANESTHESIA N/A 11/20/2019   Procedure: IR WITH ANESTHESIA;  Surgeon: [name], MD;  Location: [facility];  Service: Radiology;  Laterality: N/A;   No family history on file. Social History   Tobacco Use  . Smoking status: Unknown If Ever Smoked  Substance Use Topics  . Alcohol use: Not on file  . Drug use: Not on file   Allergies  Allergen Reactions  . Penicillins Rash     Medications Prior to Admission  Medication Sig Dispense Refill  . amLODipine (NORVASC) 10 MG tablet Take 10 mg by mouth daily.    Marland Kitchen apixaban (ELIQUIS) 5 MG TABS tablet Place 1 tablet (5 mg total) into feeding tube 2 (two) times daily. 60 tablet 2  . hydrALAZINE (APRESOLINE) 50 MG tablet Take 50 mg by mouth 3 (three) times daily.    Marland Kitchen levETIRAcetam (KEPPRA) 100 MG/ML solution Place 5 mLs (500 mg total) into feeding tube 2 (two) times daily. 473 mL 12  . linaclotide (LINZESS) 145 MCG CAPS capsule Take 145 mcg by mouth daily before breakfast.    . metFORMIN (GLUCOPHAGE)  500 MG tablet Take 1 tablet (500 mg total) by mouth 2 (two) times daily. 30 tablet 0  . Nutritional Supplements (FEEDING SUPPLEMENT, JEVITY 1.2 CAL,) LIQD Place 1,000 mLs into feeding tube continuous. 1 mL 0  . omeprazole (PRILOSEC) 20 MG capsule Take 20 mg by mouth daily.    . ondansetron (ZOFRAN) 4 MG tablet Take 4 mg by mouth 3 (three) times daily as needed for nausea or vomiting.     . Pollen Extracts (PROSTAT PO) 30 mLs by Gastric Tube route in the morning, at noon, and at bedtime.    . hydrOXYzine (ATARAX/VISTARIL) 10 MG tablet Place 10 mg into feeding tube in the morning and at bedtime.    . insulin lispro (HUMALOG) 100 UNIT/ML injection Inject 2-11 Units into the skin 2 (two) times daily as needed for high blood sugar (Check Sugar BID per [facility]). Sliding Scale- 0-200= 0 units 201-250= 2 units 251-300= 4 units 301-350= 6units 351-400= 10 units >400 = 11 units    . LORazepam (ATIVAN) 0.5 MG tablet Take 0.5 mg by mouth once.    . Nutritional Supplements (FEEDING SUPPLEMENT, PROSOURCE TF,) liquid Place 45 mLs into feeding tube 3 (three) times daily. (Patient not taking: Reported on 01/05/2020) 1 mL 0     Temp:  [97.6 F (36.4 C)-100.2 F (37.9 C)] 99.5 F (37.5 C) (07/24 1126) Pulse Rate:  [57-83] 80 (07/24 1400) Resp:  [18-22]  22 (07/24 1400) BP: (98-143)/(46-67) 117/51 (07/24 1400) SpO2:  [89 %-100 %] 98 % (07/24 1400) Arterial Line BP: (138-159)/(43-61) 159/51 (07/24 1400) FiO2 (%):  [40 %] 40 % (07/24 1200) Weight:  [84.7 kg] 84.7 kg (07/24 0500) Body mass index is 30.14 kg/m.  General: Laying in bed; intubated. HENT: Normal oropharynx and mucosa. Normal external appearance of ears and nose Neck: Supple, no pain or tenderness. CV: RRR without murmur. No peripheral edema. Pulmonary: Symmetric chest rise. Not breathing over vent.. Abdomen: soft, non-tender. Ext: No cyanosis, edema, or deformity. Skin: No rash. Normal palpation of skin. Musculoskeletal: Normal digits and  nails by inspection. No Clubbing.  Neurologic Examination  MENTAL STATUS: She is intubated and sedated. No response to voice, no response to loud clap, no response to noxious stimuli. Pupils 4 mm bilaterally and nonreactive to light. Corneals absent bilaterally No cough, no gag. Oculocephalic reflex is negative. No response to nares stimulation. Does not withdraw to noxious stimuli in any extremities.  Imaging/Labs/Diagnostics: I reviewed CT head without contrast from 01/05/2020 and demonstrate multifocal infarcts in the left MCA watershed distribution.  IMPRESSION: This studyshowed1-3 seizures without clinical signs arising from left hemisphere, lasting 1.5-2 minutes as well as profounddiffuse encephalopathy likely related tounderlying structural abnormality, sedation and seizures.  Impression and plan: Rebecca Green is a 69 y.o. 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 cEEG showed focal status epilepticus. CT head with stable subarachnoid hemorrhage and left MCA infarcts. Continues EEG with focal status.  Impression: -Refractory status epilepticus.  - AED regimen:  Fycompa 4 mg nightly  Vimpat 200 mg twice daily  Keppra 750 mg every 12 hours  Versed 10mg /Hr  Ketamine 1.5mg /Kg bolus, 1.2mg /Kg/Hr maintenance. - Propofol was stopped 2/2 uptrending Triglycerides.   -I spoke to patient's daughter and son-in-law in detail over phone about her difficult to control seizures and noted left MCA strokes on the CT head. Discussed with them that she will likely have some brain injury as a result of having prolonged seizures and given the location of her stroke, she may have some language deficit with difficulty expressing an understanding language. - Neurology will continue to follow  along ______________________________________________________________________  Thank you for the opportunity to take part in the care of this patient. If you have any further questions, please contact the neurology consultation attending on call. Signed,   Donnetta Simpers

## 2020-01-06 NOTE — Progress Notes (Signed)
Signs of potential seizure activity noted, twitching to right hand and face. EEG event button pressed and RN spoke with [employer] EEG monitor tech, who is to contact neurologist. Will continue to monitor closely.

## 2020-01-06 NOTE — Progress Notes (Signed)
NAME:  Harford, MRN:  [id number], DOB:  10-01-1950, LOS: 2 ADMISSION DATE:  01/02/2020, CONSULTATION DATE: 12/25/2019 REFERRING MD:  ED CHIEF COMPLAINT: Seizures  Brief History   69 yo female with recent admission in HTN, June for [facility] s/p crani and clipping admitted for seizures in setting of hyperglycemia  History of present illness    Patient recently evaluated in the ED on 7/19 for accidental tracheostomy removal.  It was felt at that time she was safe to have tracheostomy remain out.  Incidentally, labs were notable for hyperglycemia. She was started on Metformin with instructions to follow up with PCP.  Today, patient presents to ED from SNF with seizures.  She received 5 mg versed IM with cessation of seizure activity.  Subsequently had recurrent seizures, Given additional 5 mg IV versed. Noted to be unresponsive.  SpO2 noted to be in the 70's. Ultimately required intubation.   She received 3.5 gm of Keppra in the ED and Neurology consulted for further evaluation. EEG in the ED reads focal motor seizure of the left hemisphere, maximal left frontal. Additional Fosphenytoin given by Neurology.   Past Medical History  Seizure Hypertension SAH s/p crani with clipping Tracheostomy 6/17, decannulated 7/19  Significant Hospital Events   Admit 7/22  Consults:  PCCM 7/22 Neuro 7/22  Procedures:  CVC 7/22>  Significant Diagnostic Tests:  [facility] 7/22: chronic appearing left MCA infarct, low density fluid collection left frontal lobe 4 mm (previously 6 mm).  Chronic left subdural hygroma 7/22 EEG> focal motor seizure of the left hemisphere, especially left frontal, slow generalized background activity cEEG>  Micro Data:  COVID Negative 7/22 Blood Cx 7/22 >> Respiratory Cx 7/22 >>  Antimicrobials:  Vanc 7/22 Cefepime 7/22  Interim history/subjective:    EEG with mostly suppressed background with epileptogenic discharges.  Objective   Blood pressure (!) 137/58, pulse 77,  temperature 100.2 F (37.9 C), temperature source Oral, resp. rate 18, weight 84.7 kg, SpO2 91 %.    Vent Mode: PRVC FiO2 (%):  [40 %] 40 % Set Rate:  [18 bmp] 18 bmp Vt Set:  [470 mL] 470 mL PEEP:  [5 cmH20] 5 cmH20 Plateau Pressure:  [18 cmH20-21 cmH20] 21 cmH20   Intake/Output Summary (Last 24 hours) at 01/06/2020 0746 Last data filed at 01/06/2020 0600 Gross per 24 hour  Intake 4526.46 ml  Output 1485 ml  Net 3041.46 ml   Filed Weights   01/05/20 0500 01/06/20 0500  Weight: 77.4 kg 84.7 kg    Examination: General: Critically ill-appearing woman laying in bed intubated, heavily sedated HENT: [place]/AT, eyes anicteric, ETT in place Neck: Nearly closed trach site Lungs: Scattered rhonchi, copious thin yellow secretions from ETT.  No tube feeds running.  Occasionally breathing above the vent. Cardiovascular: Regular rate and rhythm, no murmurs Abdomen: Soft, nontender, nondistended.  PEG without surrounding erythema. Extremities: Pedal edema, ulcers with reducible extension contractures. Neuro: RASS -5, minimally reactive pupils.  Periodically breathing above the vent.   CXR personally reviewed-left basilar infiltrate, patchy infiltrates bilaterally.  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 CTH without acute intracranial process: Known hygroma and left frontal fluid collection.  New MCA strokes on CT 7/23.  --Neurology following; continue EEG monitoring --EEG in left frontal hemisphere focal seizures --Etiology: Unclear infection vs DKA vs fluid collection --AED's per Neuro: Keppra, Fosphenytoin --Neuro considering addition of Propofol.   Acute hypoxic respiratory failure requiring  mechanical ventilation Concern for aspiration pneumonia. -resp cutlureP  -Continue low tidal volume ventilation, 4 to 8 cc/kg ideal body weight.  Plateau at goal.  Titrate down FiO2 as able to wean SPO2 greater than  90%. --Tracheostomy decannulated 7/19 --Continue empiric abx: vanc and zosyn (cefepime can lower seizure threshold).  --VAP prevention protocol -No SATs until stable neurologically  Shock, undifferentiated--medication induced vs sepsis --Continue to monitor cultures and empiric antibiotics --Vasopressors as needed to maintain MAP greater than 65  DM, Type II, uncontrolled.  Hyperglycemic but no DKA at presentation.. Hgb A1c 7.8. Recently started on Metformin for hyperglycemia. --Hold PTA Metformin --Insulin drip  Hypernatremia -Free water enterally  AKI; baseline Cr ~ 1.2, likely due to sepsis Hyperkalemia -Lokelma -EKG & IV calcium PRN -Strict I's/O -Renally dose medications -MAP goal > 65  Lactic acidosis due to seizures, likely also sepsis --Repeat lactic acid this morning  Chronic anemia -Continue to monitor daily -Transfuse for hemoglobin less than 7 or hemodynamically significant bleeding  H/o HTN --Hold home amlodipine, hydralazine  Recent SAH s/p crani and clipping 11/2019 --Baseline neurological exam: Looks around, was able to state family members name. Moves all extremities. Able to stand with assistance   Recent LUE DVT --On eliquis as outpatient; would not restart DOAC at this time in case she needs repeat tracheostomy --Hold systemic a/c for now, start DVT ppx   Best practice:  Diet: NPO, NGT to LIWS Pain/Anxiety/Delirium protocol (if indicated): Per Neurology VAP protocol (if indicated): VAP Bundle DVT prophylaxis: Heparin subq GI prophylaxis: PPI Glucose control: basal bolus insulin Mobility: Bedrest.  Code Status: FULL Family Communication: Daughter: Audelia Acton Abend- left message Disposition:  ICU  Labs   CBC: Recent Labs  Lab 01/01/20 1003 01/01/20 1003 01/10/2020 1120 01/01/2020 1135 01/09/2020 1602 12/18/2019 1753 01/05/20 0227 01/05/20 0845 01/05/20 1053  WBC 9.8   < > 17.9*  --  16.4* 18.8* 18.3*  --  15.2*  NEUTROABS 7.4  --  16.4*  --    --   --   --   --   --   HGB 9.9*   < > 8.4*   < > 9.2* 9.1* 7.6* 6.8* 7.4*  HCT 32.9*   < > 30.6*   < > 31.4* 30.4* 25.9* 20.0* 25.5*  MCV 96.5   < > 105.5*  --  99.1 100.0 99.6  --  100.8*  PLT 459*   < > 423*  --  410* 398 351  --  352   < > = values in this interval not displayed.    Basic Metabolic Panel: Recent Labs  Lab 12/14/2019 1135 12/22/2019 1426 01/13/2020 1602 01/03/2020 1602 12/14/2019 1753 12/17/2019 1753 12/26/2019 2121 01/05/20 0227 01/05/20 0819 01/05/20 0845 01/05/20 1600 01/06/20 0437  NA   < >  --  153*   < > 153*   < > 151* 151* 150* 151* 149*  --   K   < >  --  4.7   < > 5.1   < > 5.6* 5.8* 5.6* 5.5* 5.6*  --   CL   < >  --  116*   < > 119*  --  117* 117* 117*  --  117*  --   CO2   < >  --  24   < > 18*  --  21* 22 22  --  21*  --   GLUCOSE   < >  --  594*   < > 259*  --  204* 169* 143*  --  148*  --   BUN   < >  --  95*   < > 91*  --  86* 82* 79*  --  78*  --   CREATININE   < >  --  3.05*   < > 2.80*  --  2.72* 2.68* 2.83*  --  2.72*  --   CALCIUM   < >  --  9.6   < > 9.4  --  8.8* 8.8* 8.5*  --  8.5*  --   MG  --  2.7* 2.7*  --   --   --   --  1.9  --   --   --  1.8  PHOS  --  5.1* 4.8*  --   --   --   --   --   --   --   --  5.6*   < > = values in this interval not displayed.   GFR: Estimated Creatinine Clearance: 21.1 mL/min (A) (by C-G formula based on SCr of 2.72 mg/dL (H)). Recent Labs  Lab 01/06/2020 1120 12/29/2019 1303 01/06/2020 1602 12/18/2019 1753 01/05/20 0227 01/05/20 0819 01/05/20 1053 01/05/20 1118  WBC   < >  --  16.4* 18.8* 18.3*  --  15.2*  --   LATICACIDVEN  --  3.4*  --  6.8*  --  3.4*  --  2.4*   < > = values in this interval not displayed.    Liver Function Tests: Recent Labs  Lab 12/20/2019 1120  AST 15  ALT 19  ALKPHOS 94  BILITOT 0.8  PROT 6.1*  ALBUMIN 2.5*   No results for input(s): LIPASE, AMYLASE in the last 168 hours. No results for input(s): AMMONIA in the last 168 hours.  ABG    Component Value Date/Time   PHART  7.351 01/05/2020 0845   PCO2ART 39.8 01/05/2020 0845   PO2ART 71 (L) 01/05/2020 0845   HCO3 22.0 01/05/2020 0845   TCO2 23 01/05/2020 0845   ACIDBASEDEF 3.0 (H) 01/05/2020 0845   O2SAT 93.0 01/05/2020 0845     Coagulation Profile: No results for input(s): INR, PROTIME in the last 168 hours.  Cardiac Enzymes: Recent Labs  Lab 01/05/20 0819  CKTOTAL 108    HbA1C: Hgb A1c MFr Bld  Date/Time Value Ref Range Status  11/21/2019 10:55 AM 7.8 (H) 4.8 - 5.6 % Final    Comment:    (NOTE) Pre diabetes:          5.7%-6.4% Diabetes:              >6.4% Glycemic control for   <7.0% adults with diabetes     CBG: Recent Labs  Lab 01/05/20 1524 01/05/20 1924 01/05/20 2327 01/06/20 0310 01/06/20 0710  GLUCAP 128* 152* 164* 141* 124*     This patient is critically ill with multiple organ system failure which requires frequent high complexity decision making, assessment, support, evaluation, and titration of therapies. This was completed through the application of advanced monitoring technologies and extensive interpretation of multiple databases. During this encounter critical care time was devoted to patient care services described in this note for 40 minutes.  Collier Bullock, DO 01/06/20 7:46 AM  [facility]

## 2020-01-06 NOTE — Progress Notes (Signed)
Pharmacy Antibiotic Note  Rebecca Green is a 69 y.o. female admitted on 12/28/2019 with status epilepticus, concern for sepsis .   VR - 12   Weight: 84.7 kg (186 lb 11.7 oz)  Temp (24hrs), Avg:98.5 F (36.9 C), Min:97.6 F (36.4 C), Max:100.2 F (37.9 C)  Recent Labs  Lab 01/03/2020 1120 01/13/2020 1120 12/23/2019 1135 01/09/2020 1303 12/22/2019 1602 01/12/2020 1602 12/27/2019 1753 12/15/2019 2121 01/05/20 0227 01/05/20 0819 01/05/20 1053 01/05/20 1118 01/05/20 1600 01/06/20 0437 01/06/20 0805  WBC 17.9*   < >  --   --  16.4*  --  18.8*  --  18.3*  --  15.2*  --   --   --  11.1*  CREATININE 3.15*   < >   < >  --  3.05*   < > 2.80* 2.72* 2.68* 2.83*  --   --  2.72*  --   --   LATICACIDVEN 3.3*  --   --  3.4*  --   --  6.8*  --   --  3.4*  --  2.4*  --   --   --   VANCORANDOM  --   --   --   --   --   --   --   --   --   --   --   --   --  12  --    < > = values in this interval not displayed.    Estimated Creatinine Clearance: 21.1 mL/min (A) (by C-G formula based on SCr of 2.72 mg/dL (H)).    Allergies  Allergen Reactions  . Penicillins Rash   Plan: Vanc 1 g x 1 Cefepime 2g q 24h Vanc random lvl mon if continues  Barth Kirks, PharmD, BCPS, BCCCP Clinical Pharmacist [phone]  Please check AMION for all [place] numbers  01/06/2020 8:58 AM

## 2020-01-06 NOTE — Procedures (Signed)
Arterial Catheter Insertion Procedure Note  Rebecca Green  [id number]  04-18-1951  Date:01/06/20  Time:9:21 AM    Provider Performing: Roselyn Meier    Procedure: Insertion of Arterial Line [phone]) without US guidance  Indication(s) Blood pressure monitoring and/or need for frequent ABGs  Consent Risks of the procedure as well as the alternatives and risks of each were explained to the patient and/or caregiver.  Consent for the procedure was obtained and is signed in the bedside chart  Anesthesia None   Time Out Verified patient identification, verified procedure, site/side was marked, verified correct patient position, special equipment/implants available, medications/allergies/relevant history reviewed, required imaging and test results available.   Sterile Technique Maximal sterile technique including full sterile barrier drape, hand hygiene, sterile gown, sterile gloves, mask, hair covering, sterile ultrasound probe cover (if used).   Procedure Description Area of catheter insertion was cleaned with chlorhexidine and draped in sterile fashion. Without real-time ultrasound guidance an arterial catheter was placed into the right radial artery.  Appropriate arterial tracings confirmed on monitor.     Complications/Tolerance None; patient tolerated the procedure well.   EBL Minimal   Specimen(s) None

## 2020-01-06 NOTE — Progress Notes (Signed)
LTM maint complete - no skin breakdown under: Fp2 F8 F4 A2 A1

## 2020-01-07 DIAGNOSIS — J9621 Acute and chronic respiratory failure with hypoxia: Secondary | ICD-10-CM | POA: Diagnosis not present

## 2020-01-07 DIAGNOSIS — R569 Unspecified convulsions: Secondary | ICD-10-CM | POA: Diagnosis not present

## 2020-01-07 DIAGNOSIS — G40101 Localization-related (focal) (partial) symptomatic epilepsy and epileptic syndromes with simple partial seizures, not intractable, with status epilepticus: Secondary | ICD-10-CM | POA: Diagnosis not present

## 2020-01-07 DIAGNOSIS — A419 Sepsis, unspecified organism: Secondary | ICD-10-CM | POA: Diagnosis not present

## 2020-01-07 DIAGNOSIS — R6521 Severe sepsis with septic shock: Secondary | ICD-10-CM | POA: Diagnosis not present

## 2020-01-07 LAB — CBC WITH DIFFERENTIAL/PLATELET
Abs Immature Granulocytes: 0.29 10*3/uL — ABNORMAL HIGH (ref 0.00–0.07)
Basophils Absolute: 0.1 10*3/uL (ref 0.0–0.1)
Basophils Relative: 1 %
Eosinophils Absolute: 0.1 10*3/uL (ref 0.0–0.5)
Eosinophils Relative: 1 %
HCT: 25.3 % — ABNORMAL LOW (ref 36.0–46.0)
Hemoglobin: 7.4 g/dL — ABNORMAL LOW (ref 12.0–15.0)
Immature Granulocytes: 3 %
Lymphocytes Relative: 19 %
Lymphs Abs: 2 10*3/uL (ref 0.7–4.0)
MCH: 28.5 pg (ref 26.0–34.0)
MCHC: 29.2 g/dL — ABNORMAL LOW (ref 30.0–36.0)
MCV: 97.3 fL (ref 80.0–100.0)
Monocytes Absolute: 0.6 10*3/uL (ref 0.1–1.0)
Monocytes Relative: 6 %
Neutro Abs: 7.4 10*3/uL (ref 1.7–7.7)
Neutrophils Relative %: 70 %
Platelets: 302 10*3/uL (ref 150–400)
RBC: 2.6 MIL/uL — ABNORMAL LOW (ref 3.87–5.11)
RDW: 14.8 % (ref 11.5–15.5)
WBC: 10.4 10*3/uL (ref 4.0–10.5)
nRBC: 1.2 % — ABNORMAL HIGH (ref 0.0–0.2)

## 2020-01-07 LAB — CULTURE, RESPIRATORY W GRAM STAIN: Culture: NORMAL

## 2020-01-07 LAB — BASIC METABOLIC PANEL
Anion gap: 13 (ref 5–15)
BUN: 87 mg/dL — ABNORMAL HIGH (ref 8–23)
CO2: 16 mmol/L — ABNORMAL LOW (ref 22–32)
Calcium: 7.9 mg/dL — ABNORMAL LOW (ref 8.9–10.3)
Chloride: 116 mmol/L — ABNORMAL HIGH (ref 98–111)
Creatinine, Ser: 2.85 mg/dL — ABNORMAL HIGH (ref 0.44–1.00)
GFR calc Af Amer: 19 mL/min — ABNORMAL LOW (ref >60)
GFR calc non Af Amer: 16 mL/min — ABNORMAL LOW (ref >60)
Glucose, Bld: 169 mg/dL — ABNORMAL HIGH (ref 70–99)
Potassium: 5.2 mmol/L — ABNORMAL HIGH (ref 3.5–5.1)
Sodium: 145 mmol/L (ref 135–145)

## 2020-01-07 LAB — MAGNESIUM: Magnesium: 1.6 mg/dL — ABNORMAL LOW (ref 1.7–2.4)

## 2020-01-07 LAB — PHOSPHORUS: Phosphorus: 5.3 mg/dL — ABNORMAL HIGH (ref 2.5–4.6)

## 2020-01-07 LAB — GLUCOSE, CAPILLARY: Glucose-Capillary: 156 mg/dL — ABNORMAL HIGH (ref 70–99)

## 2020-01-07 MED ORDER — EPINEPHRINE 1 MG/10ML IJ SOSY
PREFILLED_SYRINGE | INTRAMUSCULAR | Status: AC
  Filled 2020-01-07: qty 10

## 2020-01-07 MED ORDER — EPINEPHRINE 1 MG/10ML IJ SOSY
PREFILLED_SYRINGE | INTRAMUSCULAR | Status: AC
  Filled 2020-01-07: qty 40

## 2020-01-07 MED FILL — Medication: Qty: 1 | Status: AC

## 2020-01-08 LAB — GLUCOSE, CAPILLARY: Glucose-Capillary: 121 mg/dL — ABNORMAL HIGH (ref 70–99)

## 2020-01-09 LAB — CULTURE, BLOOD (ROUTINE X 2)
Culture: NO GROWTH
Culture: NO GROWTH
Special Requests: ADEQUATE
Special Requests: ADEQUATE

## 2020-01-12 LAB — CULTURE, BLOOD (ROUTINE X 2): Culture: NO GROWTH

## 2020-01-14 NOTE — Progress Notes (Signed)
   01-27-20 0730  Clinical Encounter Type  Visited With Health care provider  Visit Type Initial;Code;Death   Chaplain responded to a code blue. Patient did not survive. Family is not present, but they have been notified. According to an RN on the unit, family is from the [place] area. Spiritual care services available as needed.   Jeri Lager, Chaplain

## 2020-01-14 NOTE — Procedures (Addendum)
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 Received 5 mg of Versed in route however started to seize again. On arrival to [facility], ER patient unresponsive. CBG greater than 600.EEG to evaluate for seizure  Level of alertness:Comatose  AEDs during EEG study:Versed,ketamine,keppra, VPA, perampanel, vimpat  Technical aspects: This EEG study was done with scalp electrodes positioned according to the 10-20 International system of electrode placement. Electrical activity was acquired at a sampling rate of 500Hz  and reviewed with a high frequency filter of 70Hz  and a low frequency filter of 1Hz . EEG data were recorded continuously and digitally stored.   Description:EEG showed generalized suppression with intermittent high amplitude 3-6hz  sharply contoured theta-delta slowing. One to three seizures arising from left hemisphere, lasting 1.5-2 minutes were also noted. At 0707 on 2020-01-11 patient was noted to be bradycardic after which she went into asystole. Code blue was called and CPR initiated. Patient passed away at 723 am  ABNORMALITY - Seizure, left hemisphere - Burst suppression, generalized  IMPRESSION: This studyinitially showed1-3 seizures arising from left hemisphere, lasting 1.5-2 minutes as well as profounddiffuse encephalopathy likely related tounderlying structural abnormality, sedation and seizures.  At 0707 on 01/11/2020 patient was noted to be bradycardic after which she went into asystole. Code blue was called and CPR initiated. Patient passed away at 723 am.   Rebecca Green

## 2020-01-14 NOTE — Progress Notes (Signed)
E link notified of new fever 102.9. Tylenol given (see MAR) and ice packs applied. Will continue to monitor closely.

## 2020-01-14 NOTE — Code Documentation (Signed)
  Patient Name: Rebecca Green   MRN: [id number]   Date of Birth/ Sex: [birth year] , female      Admission Date: 12/17/2019  Attending Provider: Collier Bullock, MD  Primary Diagnosis: Status epilepticus ([place]) [G40.901] Ileus of unspecified type [facility]) [K56.7]   Indication: Pt was in her usual state of health until this 7.08 AM, when she was noted to be Asystole. Code blue was subsequently called. At the time of arrival on scene, ACLS protocol was underway.   Technical Description:  - CPR performance duration:  7.08 - 7.23 AM  - Was defibrillation or cardioversion used? Yes   - Was external pacer placed? Yes  - Was patient intubated pre/post CPR? Yes   Medications Administered: Y = Yes; Blank = No Amiodarone    Atropine  y  Calcium    Epinephrine  y  Lidocaine    Magnesium    Norepinephrine    Phenylephrine    Sodium bicarbonate  y  Vasopressin    Other    Post CPR evaluation:  - Final Status - Was patient successfully resuscitated ? No   Miscellaneous Information:  - Time of death:  7.23 AM AM  - Primary team notified?  Yes  - Family Notified? Yes     Gaylan Gerold, DO   2020-01-22, 7:26 AM

## 2020-01-14 NOTE — Progress Notes (Signed)
Converted to a fib, 12-lead EKG done to confirm. Dr. Leonel Ramsay notified. Per MD, ketamine reduced to 3, no anticoagulation added d/t recent stroke, rate control per CCM. Pt's rate is controlled at 88-92 at this time. Will continue to monitor closely.

## 2020-01-14 NOTE — Progress Notes (Signed)
Pharmacy Note:  Ketamine 2500 mg in 500 ml NS wasted in stericycle in main pharmacy.

## 2020-01-14 NOTE — Progress Notes (Signed)
LTM EEG discontinued - no skin breakdown at [facility]. Deceased.

## 2020-01-14 NOTE — Discharge Summary (Addendum)
DEATH SUMMARY   Patient Details  Name: Rebecca Green MRN: [id number] DOB: Mar 14, 1951  Admission/Discharge Information   Admit Date:  01/13/20  Date of Death: Date of Death: 2020-01-16  Time of Death: Time of Death: 0735  Length of Stay: 3  Referring Physician: Lowella Dandy, NP   Reason(s) for Hospitalization  seizure  Diagnoses  Preliminary cause of death:  Secondary Diagnoses (including complications and co-morbidities):  Active Problems:   Status epilepticus [facility])   Brief Hospital Course (including significant findings, care, treatment, and services provided and events leading to death)  Elisabet Gutzmer Filkins is a 69 y.o. year old female with a history of diabetes mellitus, HTN, hx of SAH s/p crani with clipping, tracheostomy 6/17 decannulated 7/19, here with new onset seizures, new MCA watershed infarcts noted on 7/23 head CT, Septic shock, lactic acidosis, hypernatremia, and AKI, hx of LUE DVT, held eliquis on admission, dvt ppx started.   Persistent seizures despite multiple seizure medications, propofol infusion (stopped yesterday due to hypertriglyceridemia), ketamine infusion started 7/24.  Poor prognosis discussed with daughter 7/24.  Overnight developed cardiac arrhythmia, wide complex possible junctional rhythm, atrial fibrillation, fevers, worsening hypotension requiring increasing levophed dosage.   Ketamine dosage decreased overnight given arrhythmia.   Bradycardic cardiac arrest this am prior to my arrival.  See code note for details.   Family notified of her death by code team.      Pertinent Labs and Studies  Significant Diagnostic Studies EEG  Result Date: 2020/01/13 Lora Havens, MD     13-Jan-2020  2:04 PM Patient Name: Rebecca Green MRN: [id number] Epilepsy Attending: Lora Havens Referring Physician/Provider: Dr Karena Addison Aroor Date: 2020-01-13 Duration: 24.51 mins Patient history: 69 y.o. 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  Received 5 mg of Versed in route however started to seize again.  On arrival to [facility], ER patient unresponsive.  CBG greater than 600. EEG to evaluate for seizure Level of alertness:  Comatose AEDs during EEG study: Versed, keppra Technical aspects: This EEG study was done with scalp electrodes positioned according to the 10-20 International system of electrode placement. Electrical activity was acquired at a sampling rate of 500Hz  and reviewed with a high frequency filter of 70Hz  and a low frequency filter of 1Hz . EEG data were recorded continuously and digitally stored. Description: Four seizures were noted at 1242, 1251, 1259 and 1301, lasting, 25-50 seconds during which patient was noted to have twitching of mouth and possibly right face twitching which was difficult to see on video. Concomitant eeg showed 5-6hz  theta slowing admixed with spikes in left hemisphere, maximal left frontal region which evolved into 2-3Hz  delta slowing. EEG also showed low amplitude continuous generalized 3-6Hz  theta-delta slowing. Frequent left hemispheric polyspikes were also noted during which at times patient had brief subtle head jerk.  Hyperventilation and photic stimulation were not performed.   ABNORMALITY - Focal motor seizure, left hemisphere, maximal let frontal - Polyspike, left hemisphere - Continued slow, generalized IMPRESSION: This study showed four focal motor seizures as described above arising from left hemisphere, maximal left frontal region. Frequent brief subtle head jerks were also noted with concomitant left hemispheric polyspikes and were focal motor seizures.Additionally, there is severe diffuse encephalopathy, nonspecific etiology but likely related to sedation, post-ictal state. Dr Aroor was immediately notified. [facility]  1  View  Result Date: 01/13/2020 CLINICAL DATA:  Follow-up ileus EXAM: ABDOMEN - 1 VIEW COMPARISON:  11/20/2019 FINDINGS: Gastrostomy catheter is noted in the left upper quadrant. Gastric catheter is seen within the stomach as well. Scattered large and small bowel gas is noted. No obstructive changes are noted. No bony abnormality is seen. IMPRESSION: No acute abnormality noted. Electronically Signed   By: Inez Catalina M.D.   On: 12/18/2019 16:30   CT HEAD WO CONTRAST  Result Date: 01/05/2020 CLINICAL DATA:  Mental status change and seizure. History of left craniotomy for aneurysm clipping. EXAM: CT HEAD WITHOUT CONTRAST TECHNIQUE: Contiguous axial images were obtained from the base of the skull through the vertex without intravenous contrast. COMPARISON:  CT head 01/06/2020 and 12/08/2019. FINDINGS: Despite efforts by the technologist and patient, mild motion artifact is present on today's exam and could not be eliminated. This reduces exam sensitivity and specificity. Brain: Interval development of multifocal bland infarcts in a left MCA watershed distribution, largest in the left frontal parietal lobe. Stable old infarct in the right parietooccipital lobe and old lacunar infarcts in the basal ganglia. There are stable low-density extra-axial fluid collections over both cerebral convexities. No evidence of acute intracranial hemorrhage, midline shift or hydrocephalus. Vascular: Intracranial vascular calcifications. Aneurysm clip peripherally near the MCA bifurcation. No hyperdense vessel. Skull: Previous left-sided craniotomy, unchanged. No acute osseous findings. Sinuses/Orbits: Stable bilateral mastoid effusions. The visualized paranasal sinuses are clear. No significant orbital findings. Other: None. IMPRESSION: 1. Interval development of multifocal bland infarcts in a left MCA watershed distribution. 2. No evidence of acute intracranial hemorrhage or midline shift. 3. Stable low-density extra-axial fluid  collections over both cerebral convexities. 4. Stable bilateral mastoid effusions. Electronically Signed   By: Richardean Sale M.D.   On: 01/05/2020 17:52   CT Head Wo Contrast  Result Date: 12/18/2019 CLINICAL DATA:  Altered mental status. EXAM: CT HEAD WITHOUT CONTRAST TECHNIQUE: Contiguous axial images were obtained from the base of the skull through the vertex without intravenous contrast. COMPARISON:  CT head dated December 08, 2019. FINDINGS: Brain: Expected evolution of the hypodensity involving the left insula, left inferior frontal lobe, and left anterior temporal lobe, now more CSF density in appearance. Chronic lacunar infarcts in both basal ganglia and old right parietooccipital lobe infarct again noted. Slightly increased cortical hyperdensity in the old right parietooccipital lobe infarct compared to the prior study. Interval removal of the right frontal approach ventricular drain. Previously noted small amount of residual subarachnoid hemorrhage along the left cerebral hemisphere has resolved. Slight interval decrease in size of the low-density extra-axial fluid collection along the left frontal lobe, currently measuring up to 4 mm, previously 6 mm (by my measurements). Chronic left cerebral convexity subdural hygroma is slightly increased in size, currently measuring up to 10 mm in maximal thickness, previously 7 mm. Unchanged 6 mm chronic right cerebral convexity subdural hygroma. No evidence of acute infarction, hemorrhage, hydrocephalus, or mass lesion/mass effect. Vascular: Left MCA bifurcation aneurysm clip again noted. Atherosclerotic vascular calcification of the carotid siphons. No hyperdense vessel. Skull: Prior left-sided craniotomy. Negative for fracture or focal lesion. Sinuses/Orbits: No acute finding. Unchanged partial opacification of the left greater than right mastoid air cells. Other: Previously noted left frontal scalp subgaleal fluid collection has resolved. IMPRESSION: 1.  Slightly increased cortical hyperdensity in the old right parietooccipital lobe infarct when compared to the prior study, which may reflect progressive cortical laminar necrosis, although a small amount of acute hemorrhage is not excluded.  Short-term follow-up CT is recommended. 2. Expected evolution of the now more chronic appearing left MCA territory infarct involving the left frontotemporal lobes and insula. 3. Interval removal of the right frontal approach ventricular drain. Previously noted small amount of residual subarachnoid hemorrhage along the left cerebral hemisphere has resolved. 4. Slight interval decrease in size of the low-density extra-axial fluid collection along the left frontal lobe, currently measuring up to 4 mm, previously 6 mm. 5. Chronic left cerebral convexity subdural hygroma is slightly increased in size, currently measuring up to 10 mm in maximal thickness, previously 7 mm. Unchanged 6 mm chronic right cerebral convexity subdural hygroma. 6. Left frontal scalp subgaleal fluid collection has resolved. Electronically Signed   By: Titus Dubin M.D.   On: 12/23/2019 12:53   DG Chest Port 1 View  Result Date: 01/05/2020 CLINICAL DATA:  Intubation. EXAM: PORTABLE CHEST 1 VIEW COMPARISON:  Chest x-ray 12/16/2019, 01/01/2020. FINDINGS: Endotracheal tube, NG tube, right IJ line in stable position. Two small linear densities noted over medial aspect of each lung. Although these could be outside the patient, catheter fragments cannot be excluded. Clinical correlation suggested. Heart size stable. Low lung volumes with bibasilar atelectasis. Bibasilar infiltrates/edema noted, progressed from prior exam. Small left pleural effusion again noted. No pneumothorax. IMPRESSION: 1.  Endotracheal tube, NG tube, right IJ line in stable position. 2. Two small linear densities noted over the medial aspect of each lung. Although these could be outside of the patient, catheter fragments cannot be excluded.  Clinical correlation suggested. 3. Low lung volumes with bibasilar atelectasis. Bibasilar infiltrates/edema noted, progressed from prior exam. Small left pleural effusion noted. Electronically Signed   By: Marcello Moores  Register   On: 01/05/2020 07:48   DG CHEST PORT 1 VIEW  Result Date: 01/11/2020 CLINICAL DATA:  Line placement. EXAM: PORTABLE CHEST 1 VIEW COMPARISON:  January 04, 2020 FINDINGS: There is a right-sided central venous catheter with tip projecting over the cavoatrial junction. There is no right-sided pneumothorax. The endotracheal tube terminates above the carina by approximately 3.7 cm. The enteric tube extends below the left hemidiaphragm. Persistent bibasilar airspace opacities are noted, greatest in the left lower lobe. There is a probable small left-sided pleural effusion. The heart size is stable. Aortic calcifications are noted. IMPRESSION: 1. Lines and tubes as above.  No pneumothorax. 2. Persistent bibasilar airspace opacities, left greater than right. 3. Probable small left-sided pleural effusion. Electronically Signed   By: Constance Holster M.D.   On: 01/10/2020 19:30   DG Chest Portable 1 View  Result Date: 12/14/2019 CLINICAL DATA:  ET tube reposition. EXAM: PORTABLE CHEST 1 VIEW COMPARISON:  Chest x-ray from same day at 1150 hours. FINDINGS: Interval retraction of the endotracheal tube with the tip now in good position 3.3 cm above the carina. Unchanged enteric tube. Stable cardiomediastinal silhouette. Improved aeration of the left lung with residual left basilar atelectasis and small left pleural effusion. Linear atelectasis at the right lung base. No pneumothorax. No acute osseous abnormality. IMPRESSION: 1. Appropriately positioned endotracheal tube. 2. Improved aeration of the left lung with residual left basilar atelectasis and small left pleural effusion. Electronically Signed   By: Titus Dubin M.D.   On: 12/20/2019 14:08   DG Chest Portable 1 View  Result Date:  12/14/2019 CLINICAL DATA:  Seizure. Intubation. EXAM: PORTABLE CHEST 1 VIEW COMPARISON:  01/01/2020 FINDINGS: The endotracheal tube is in the right mainstem bronchus approximately 3 cm and should be retracted at least 5-6 cm. The NG tube is  coursing down the esophagus into the stomach. Marked hyperinflation of the right lung and marked hypoaeration of the left lung because of the position of the ET tube. IMPRESSION: 1. Right mainstem bronchus intubation. This should be retracted at least 5-6 cm. 2. Marked hypoaeration of the left lung and hyperinflation of the right lung. These results were called by telephone at the time of interpretation on 01/09/2020 at 12:19 pm to provider [facility] , who verbally acknowledged these results. Electronically Signed   By: Marijo Sanes M.D.   On: 12/16/2019 12:20   DG Chest Portable 1 View  Result Date: 01/01/2020 CLINICAL DATA:  Extubation EXAM: PORTABLE CHEST 1 VIEW COMPARISON:  12/14/2019 FINDINGS: Tracheostomy tube has been removed. Stable cardiomediastinal contours. Atherosclerotic calcification of the aortic knob. Chronically increased interstitial markings bilaterally. No new focal airspace consolidation. No pleural effusion or pneumothorax. IMPRESSION: No acute cardiopulmonary findings. Chronically increased interstitial markings bilaterally. Electronically Signed   By: Davina Poke D.O.   On: 01/01/2020 10:06   DG Chest Port 1 View  Result Date: 12/14/2019 CLINICAL DATA:  Respiratory failure. EXAM: PORTABLE CHEST 1 VIEW COMPARISON:  None. FINDINGS: The tracheostomy tube is in good position, unchanged. The feeding tube has been removed. The cardiac silhouette, mediastinal and hilar contours are stable. Slightly prominent interstitial markings but no infiltrates or effusions. Streaky basilar atelectasis. IMPRESSION: 1. Stable tracheostomy tube. 2. Persistent prominent interstitial markings and streaky bibasilar atelectasis but no infiltrates or effusions.  Electronically Signed   By: Marijo Sanes M.D.   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 Received 5 mg of Versed in route however started to seize again. On arrival to [facility], ER patient unresponsive. CBG greater than 600. EEG to evaluate for seizure  Level of alertness:  Comatose  AEDs during EEG study: Versed, propofol, keppra, VPA, perampanel, vimpat  Technical aspects: This EEG study was done with scalp electrodes positioned according to the 10-20 International system of electrode placement. Electrical activity was acquired at a sampling rate of 500Hz  and reviewed with a high frequency filter of 70Hz  and a low frequency filter of 1Hz . EEG data were recorded continuously and digitally stored.  Description: EEG intially showed frequent seizures arising from left hemisphere, maximal left frontal region during which patient was noted to have right facial twitching. There were also brief head jerks with a burst of left hemispheric spikes consistent with focal motor seizures. As sedation was titrated up, eeg showed bust suppression pattern with highly epileptiform bursts in left hemipshere. At times with bursts, patient continues to have clinical seizure with head jerk. Hyperventilation and photic stimulation were not performed.    ABNORMALITY - Focal convulsive status epilepticus, left hemisphere - Burst suppression with highly epileptiform bursts in left hemisphere  IMPRESSION: This study showed focal convulsive  status epilepticus arising from left hemisphere. As sedation was titrated up, clinical seizures became less frequent but eeg continues to show highly epileptiform bursts in left hemisphere suggestive of underlying structural abnormality.  Additionally, there is profound diffuse encephalopathy, nonspecific etiology but likely related to sedation,  sezures.  Priyanka O Yadav   VAS [place] UPPER EXTREMITY VENOUS DUPLEX  Result Date: 12/13/2019 UPPER VENOUS STUDY  Limitations: Body habitus and Restricted mobility, test ended prematurely due to patient vomting/aspirating. Comparison Study: 12-04-2019 Lt upper extremity venous duplex positive for DVT                   in LT axillary and brachial veins. SVT Lt cephalic vein. Performing Technologist: Darlin Coco, RDMS  Examination Guidelines: A complete evaluation includes B-mode imaging, spectral Doppler, color Doppler, and power Doppler as needed of all accessible portions of each vessel. Bilateral testing is considered an integral part of a complete examination. Limited examinations for reoccurring indications may be performed as noted.  Right Findings: +----------+------------+---------+-----------+----------+--------------+ RIGHT     CompressiblePhasicitySpontaneousProperties   Summary     +----------+------------+---------+-----------+----------+--------------+ Subclavian                                          Not visualized +----------+------------+---------+-----------+----------+--------------+  Left Findings: +----------+------------+---------+-----------+----------+-------------------+ LEFT      CompressiblePhasicitySpontaneousProperties      Summary       +----------+------------+---------+-----------+----------+-------------------+ IJV           Full       Yes       Yes                                  +----------+------------+---------+-----------+----------+-------------------+ Subclavian                                             Not visualized    +----------+------------+---------+-----------+----------+-------------------+ Axillary      None                 No                      Acute        +----------+------------+---------+-----------+----------+-------------------+ Brachial    Partial                Yes               Age Indeterminate  +----------+------------+---------+-----------+----------+-------------------+ Radial                                                Not visualized    +----------+------------+---------+-----------+----------+-------------------+ Ulnar                                                 Not visualized    +----------+------------+---------+-----------+----------+-------------------+ Cephalic      None                 No               Acute - origin only +----------+------------+---------+-----------+----------+-------------------+ Basilic  Not visualized    +----------+------------+---------+-----------+----------+-------------------+  Summary:  Left: Findings consistent with acute deep vein thrombosis involving the left axillary vein. Findings consistent with acute superficial vein thrombosis involving the left cephalic vein. Findings consistent with age indeterminate deep vein thrombosis involving the left brachial veins.  *See table(s) above for measurements and observations.  Diagnosing physician: Harold Barban MD Electronically signed by Harold Barban MD on 12/13/2019 at 8:51:17 PM.    Final     Microbiology Recent Results (from the past 240 hour(s))  SARS Coronavirus 2 by RT PCR (hospital order, performed in [facility] hospital lab) Nasopharyngeal Nasopharyngeal Swab     Status: None   Collection Time: 12/16/2019 12:41 PM   Specimen: Nasopharyngeal Swab  Result Value Ref Range Status   SARS Coronavirus 2 NEGATIVE NEGATIVE Final    Comment: (NOTE) SARS-CoV-2 target nucleic acids are NOT  DETECTED.  The SARS-CoV-2 RNA is generally detectable in upper and lower respiratory specimens during the acute phase of infection. The lowest concentration of SARS-CoV-2 viral copies this assay can detect is 250 copies / mL. A negative result does not preclude SARS-CoV-2 infection and should not be used as the sole basis for treatment or other patient management decisions.  A negative result may occur with improper specimen collection / handling, submission of specimen other than nasopharyngeal swab, presence of viral mutation(s) within the areas targeted by this assay, and inadequate number of viral copies (<250 copies / mL). A negative result must be combined with clinical observations, patient history, and epidemiological information.  Fact Sheet for Patients:   StrictlyIdeas.no  Fact Sheet for Healthcare Providers: BankingDealers.co.za  This test is not yet approved or  cleared by the [place] FDA and has been authorized for detection and/or diagnosis of SARS-CoV-2 by FDA under an Emergency Use Authorization (EUA).  This EUA will remain in effect (meaning this test can be used) for the duration of the COVID-19 declaration under Section 564(b)(1) of the Act, 21 U.S.C. section 360bbb-3(b)(1), unless the authorization is terminated or revoked sooner.  Performed at [facility], [place] [address], [place], [place] [address]   Urine culture     Status: None   Collection Time: 01/02/2020  2:45 PM   Specimen: Urine, Random  Result Value Ref Range Status   Specimen Description URINE, RANDOM  Final   Special Requests NONE  Final   Culture   Final    NO GROWTH Performed at [facility], [place] [address] [place], [place] [address]    Report Status 01/05/2020 FINAL  Final  Culture, blood (routine x 2)     Status: None (Preliminary result)   Collection Time: 12/31/2019  4:02 PM   Specimen: BLOOD RIGHT HAND  Result Value Ref  Range Status   Specimen Description BLOOD RIGHT HAND  Final   Special Requests   Final    BOTTLES DRAWN AEROBIC AND ANAEROBIC Blood Culture adequate volume   Culture   Final    NO GROWTH 3 DAYS Performed at [facility], [place] [address] [place], [place] [address]    Report Status PENDING  Incomplete  Culture, blood (routine x 2)     Status: None (Preliminary result)   Collection Time: 01/01/2020  4:08 PM   Specimen: BLOOD LEFT HAND  Result Value Ref Range Status   Specimen Description BLOOD LEFT HAND  Final   Special Requests   Final    BOTTLES DRAWN AEROBIC ONLY Blood Culture adequate volume  Culture   Final    NO GROWTH 3 DAYS Performed at [facility], [place] [address], [place], [place] [address]    Report Status PENDING  Incomplete  MRSA PCR Screening     Status: None   Collection Time: 12/16/2019  4:51 PM   Specimen: Nasopharyngeal  Result Value Ref Range Status   MRSA by PCR NEGATIVE NEGATIVE Final    Comment:        The GeneXpert MRSA Assay (FDA approved for NASAL specimens only), is one component of a comprehensive MRSA colonization surveillance program. It is not intended to diagnose MRSA infection nor to guide or monitor treatment for MRSA infections. Performed at [facility], [place] [address], [place], [place] [address]   Culture, respiratory (non-expectorated)     Status: None   Collection Time: 01/05/20  9:09 AM   Specimen: Tracheal Aspirate; Respiratory  Result Value Ref Range Status   Specimen Description TRACHEAL ASPIRATE  Final   Special Requests NONE  Final   Gram Stain   Final    ABUNDANT WBC PRESENT, PREDOMINANTLY PMN ABUNDANT GRAM VARIABLE ROD FEW GRAM POSITIVE COCCI    Culture   Final    ABUNDANT Consistent with normal respiratory flora. Performed at [facility], [place] [address], [place], [place] [address]    Report Status 2020/01/29 FINAL  Final    Lab Basic Metabolic Panel: Recent Labs  Lab 12/24/2019 1135  12/16/2019 1426 12/22/2019 1602 01/11/2020 1753 01/05/20 0227 01/05/20 0227 01/05/20 0819 01/05/20 0845 01/05/20 1600 01/06/20 0437 01/06/20 0805 2020-01-29 0445  NA   < >  --  153*   < > 151*   < > 150* 151* 149*  --  146* 145  K   < >  --  4.7   < > 5.8*   < > 5.6* 5.5* 5.6*  --  5.3* 5.2*  CL   < >  --  116*   < > 117*  --  117*  --  117*  --  115* 116*  CO2   < >  --  24   < > 22  --  22  --  21*  --  20* 16*  GLUCOSE   < >  --  594*   < > 169*  --  143*  --  148*  --  148* 169*  BUN   < >  --  95*   < > 82*  --  79*  --  78*  --  84* 87*  CREATININE   < >  --  3.05*   < > 2.68*  --  2.83*  --  2.72*  --  2.56* 2.85*  CALCIUM   < >  --  9.6   < > 8.8*  --  8.5*  --  8.5*  --  8.1* 7.9*  MG  --  2.7* 2.7*  --  1.9  --   --   --   --  1.8  --  1.6*  PHOS  --  5.1* 4.8*  --   --   --   --   --   --  5.6*  --  5.3*   < > = values in this interval not displayed.   Liver Function Tests: Recent Labs  Lab 01/09/2020 1120 01/06/20 0805  AST 15 14*  ALT 19 15  ALKPHOS 94 76  BILITOT 0.8 0.6  PROT 6.1* 5.2*  ALBUMIN 2.5* 1.8*  No results for input(s): LIPASE, AMYLASE in the last 168 hours. No results for input(s): AMMONIA in the last 168 hours. CBC: Recent Labs  Lab 01/01/20 1003 01/01/20 1003 12/15/2019 1120 01/10/2020 1135 01/12/2020 1753 01/11/2020 1753 01/05/20 0227 01/05/20 0845 01/05/20 1053 01/06/20 0805 02-02-20 0445  WBC 9.8   < > 17.9*   < > 18.8*  --  18.3*  --  15.2* 11.1* 10.4  NEUTROABS 7.4  --  16.4*  --   --   --   --   --   --   --  7.4  HGB 9.9*   < > 8.4*   < > 9.1*   < > 7.6* 6.8* 7.4* 7.3* 7.4*  HCT 32.9*   < > 30.6*   < > 30.4*   < > 25.9* 20.0* 25.5* 25.1* 25.3*  MCV 96.5   < > 105.5*   < > 100.0  --  99.6  --  100.8* 102.9* 97.3  PLT 459*   < > 423*   < > 398  --  351  --  352 309 302   < > = values in this interval not displayed.   Cardiac Enzymes: Recent Labs  Lab 01/05/20 0819  CKTOTAL 108   Sepsis Labs: Recent Labs  Lab 01/05/2020 1303  12/26/2019 1602 01/03/2020 1753 01/11/2020 1753 01/05/20 0227 01/05/20 0819 01/05/20 1053 01/05/20 1118 01/06/20 0805 02-02-2020 0445  WBC  --    < > 18.8*   < > 18.3*  --  15.2*  --  11.1* 10.4  LATICACIDVEN 3.4*  --  6.8*  --   --  3.4*  --  2.4*  --   --    < > = values in this interval not displayed.    Procedures/Operations   Intubated in the ED 7/22  Collier Bullock MD

## 2020-01-14 NOTE — Progress Notes (Signed)
Called and spoke to pts son-in law who states family in route to visit

## 2020-01-14 NOTE — Progress Notes (Signed)
eLink Physician-Brief Progress Note Patient Name: Rebecca Green DOB: 06-08-1951 MRN: [id number]   Date of Service  01-31-20  HPI/Events of Note  Fever to 102.9 F - Already on Vancomycin and Zosyn. Just given Tylenol and ice packs.   eICU Interventions  Plan: 1. Blood cultures X 2.  2. CBC with platelets and BMP in AM.      Intervention Category Major Interventions: Infection - evaluation and management  Clent Damore Eugene 01-31-20, 4:39 AM

## 2020-01-14 NOTE — Progress Notes (Signed)
Wasted 150 ml of 250 mg/250 ml  versed concentration in sink, witnessed with charge nurse Clarene Critchley. Wasted 225 ml of 2500 mg/50 ml ketamine concentration, witnessed with charge nurse Clarene Critchley.

## 2020-01-14 DEATH — deceased

## 2021-02-22 IMAGING — CT CT HEAD W/O CM
4 of 5 series · 16 of 47 positions shown, 17 images · non-contrast
Comparison: Head CT 12/07/2019

CLINICAL DATA: Hydrocephalus, follow-up.

EXAM:
CT HEAD WITHOUT CONTRAST
TECHNIQUE: Contiguous axial images were obtained from the base of the skull
through the vertex without intravenous contrast.

[Series 3: head bone · axial · 0.41mm/px · z∈[-76,+36]mm · 7 of 86 slices shown]
[im 8/86  bone]
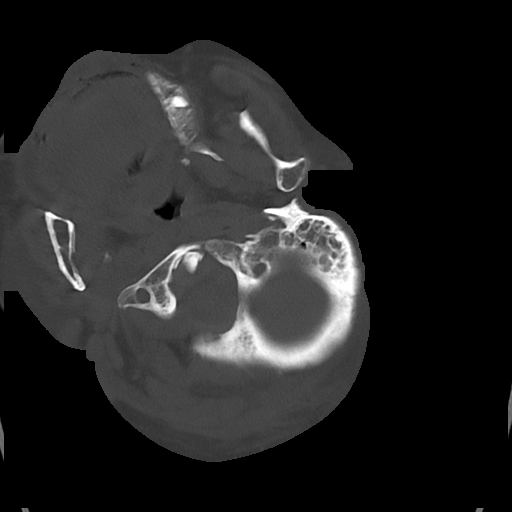
[im 22/86  bone]
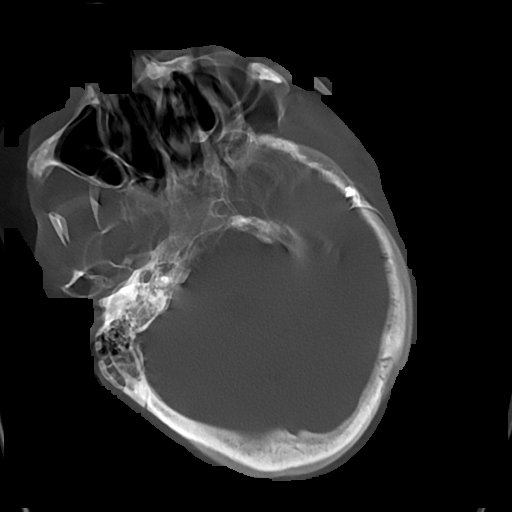
[im 29/86  bone]
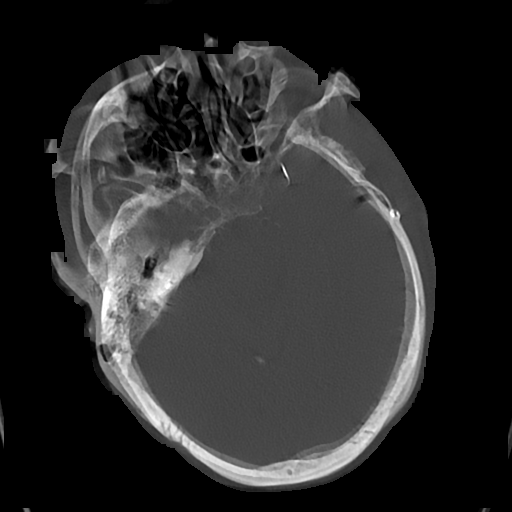
[im 36/86  bone]
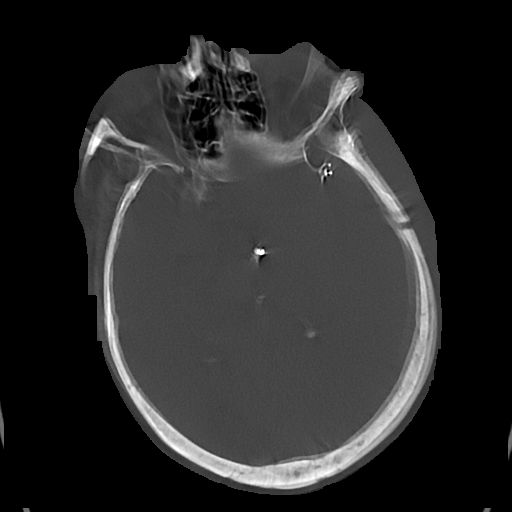
[im 50/86  bone]
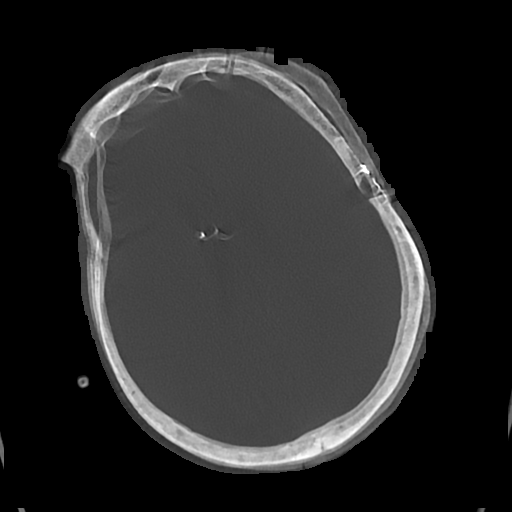
[im 57/86  bone]
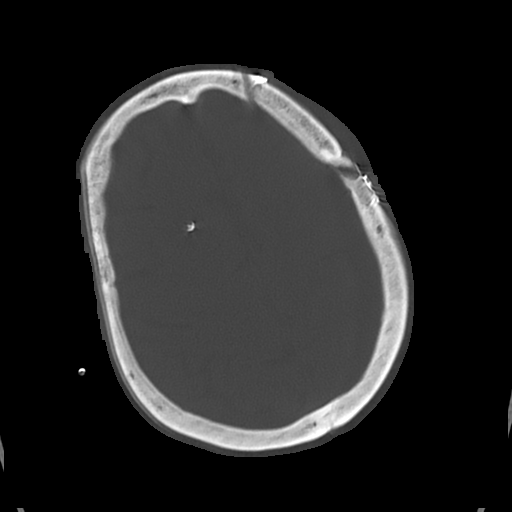
[im 64/86  bone]
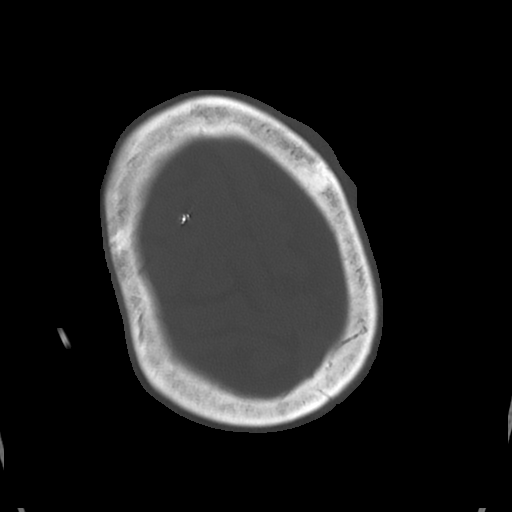

[Series 5: head wo · axial · 0.43mm/px · z∈[-50,+30]mm · 3 of 33 slices shown, 4 images]
[im 9/33  brain]
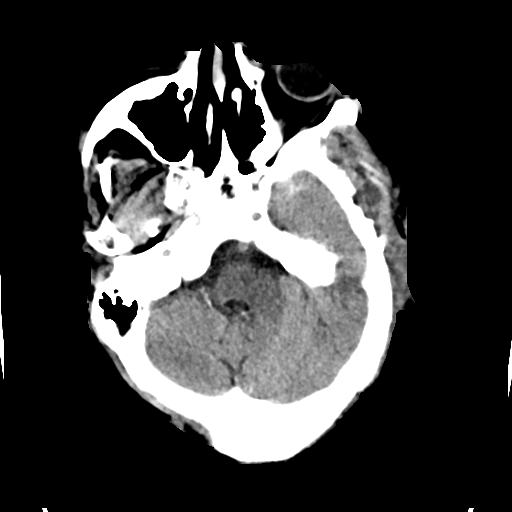
[im 9/33  bone]
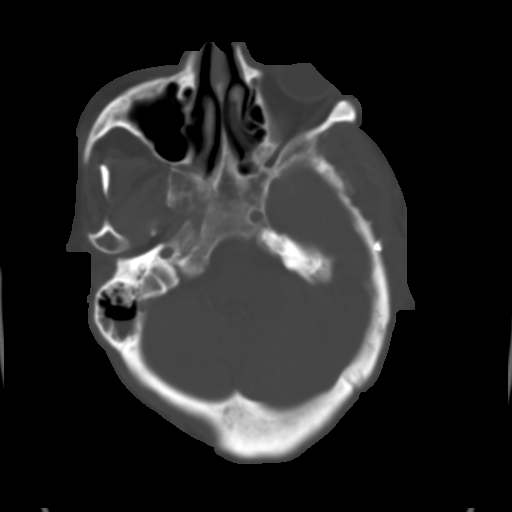
[im 17/33  brain]
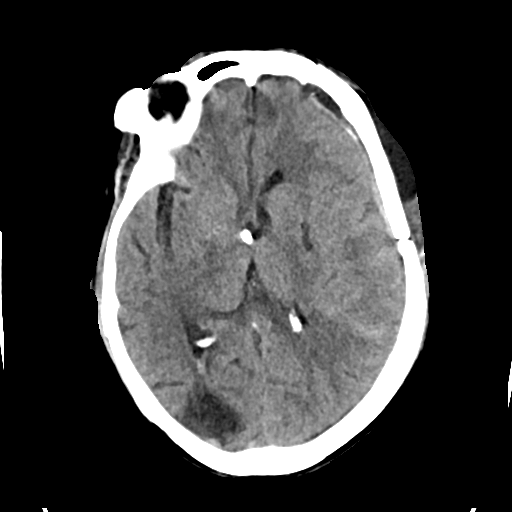
[im 25/33  brain]
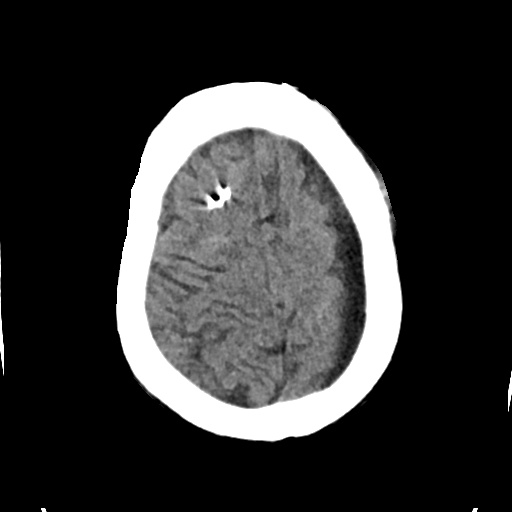

[Series 7: cor soft · coronal · 0.35mm/px · 3 of 71 slices shown]
[im 24/71  brain]
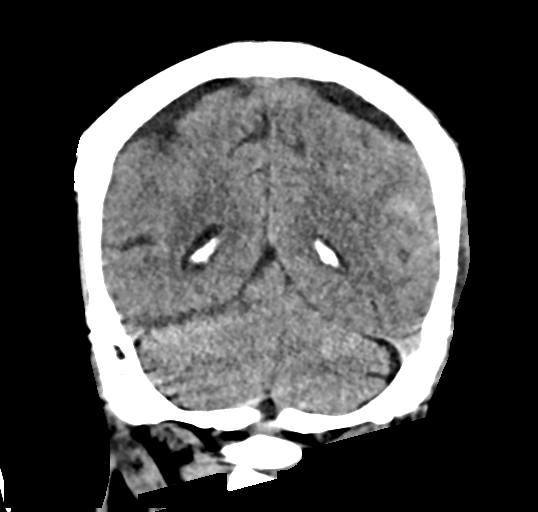
[im 32/71  brain]
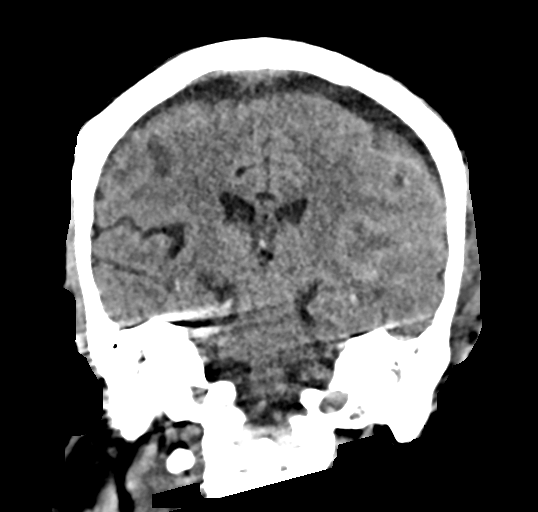
[im 39/71  brain]
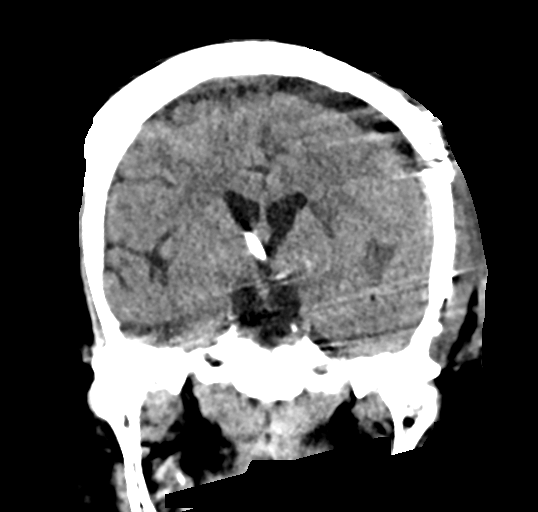

[Series 8: sag soft · sagittal · 0.33mm/px · 3 of 61 slices shown]
[im 25/61  brain]
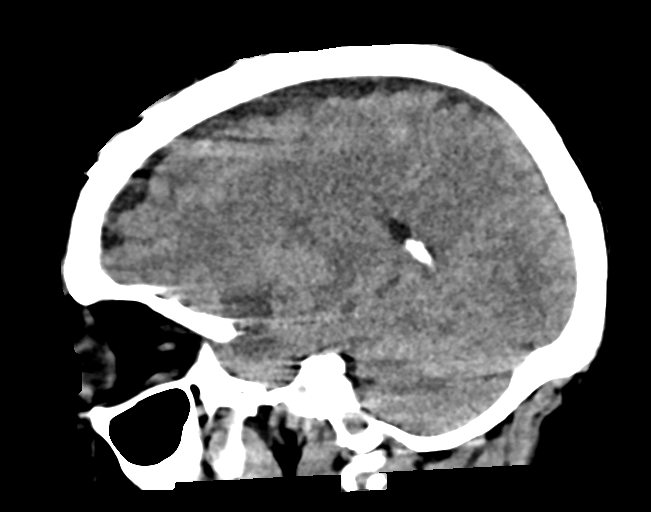
[im 31/61  brain]
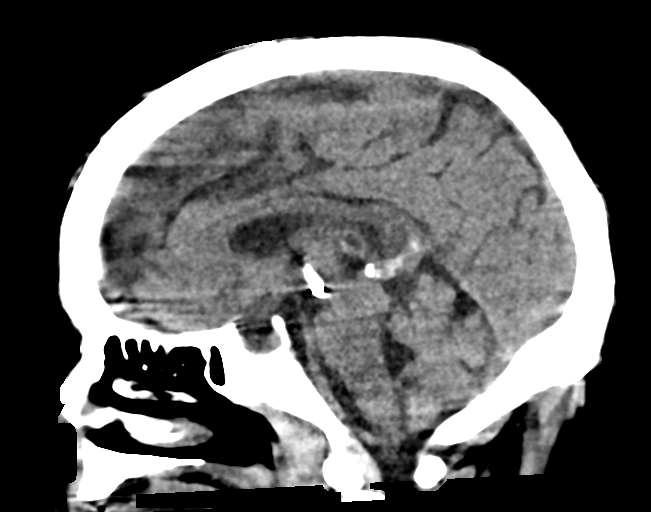
[im 37/61  brain]
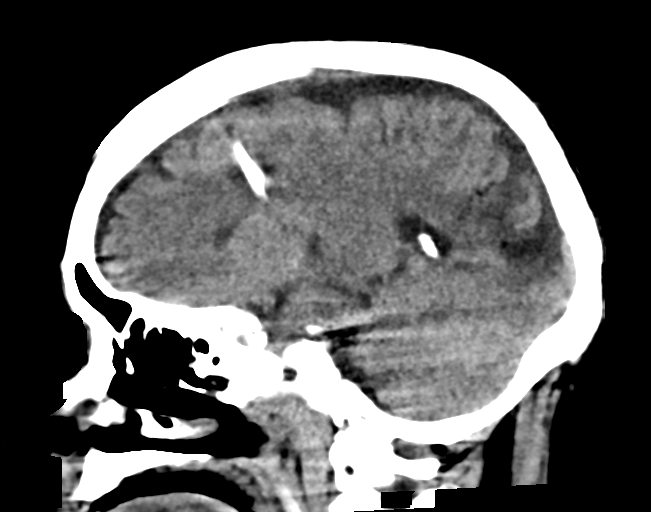

[16 of 47 positions shown; findings below may reference images not displayed]

FINDINGS: Brain:

Motion degraded exam.

As before, a right frontal approach ventricular drain crosses
midline and terminates in the region of the interpeduncular cistern.
The ventricles remain normal in size. Unchanged 3 mm rightward
midline shift.

Left pterional craniotomy. Aneurysm clip at the left MCA
bifurcation. There is a persistent small amount of residual
subarachnoid hemorrhage along the left cerebral hemisphere.
Unchanged 4 mm low-density extra-axial fluid collection along the
left frontal lobe. Unchanged 7 mm intermediate density extra-axial
fluid collection along the left frontoparietal convexity.

Similar appearance of abnormal hypodensity involving the left insula
as well as left inferior frontal lobe and anterior temporal lobe
compatible with acute/early subacute infarction.

Chronic infarcts redemonstrated within the right parietooccipital
lobe. Redemonstrated chronic ischemic changes within the basal
ganglia.

Unchanged subgaleal fluid collection in the left frontal region,
again measuring 14 mm in thickness, likely communicating with the
extra-axial fluid collection in the left frontal region.

Vascular: Left MCA bifurcation aneurysm clipping. No hyperdense
vessel is identified.

Skull: Left pterional craniotomy/cranioplasty.

Sinuses/Orbits: Visualized orbits show no acute finding. Bilateral
mastoid effusions (greater on the left).
IMPRESSION: No significant change as compared to head CT 12/07/2019 with sequela
of prior left pterional craniotomy and left MCA aneurysm clipping.

Unchanged position of a right frontal approach ventricular drain
terminating in the region of the interpeduncular cistern. No
hydrocephalus.

Persistent small volume acute subarachnoid hemorrhage along the left
cerebral hemisphere.

Unchanged extra-axial fluid collections on the left measuring up to
6 mm overlying the frontoparietal convexity. Unchanged 3 mm
rightward midline shift.

Unchanged appearance of acute/early subacute infarction changes
affecting the left insula as well as left frontotemporal lobes.

Unchanged 6 mm subgaleal fluid collection in the left frontal region
likely in communication with the thin extra-axial left frontal
collection.
